# Patient Record
Sex: Female | Born: 1935 | Race: White | Hispanic: No | Smoking: Former smoker
Health system: Southern US, Community
[De-identification: ages and names within clinical notes are randomized; demographics above are authoritative.]

## PROBLEM LIST (undated history)

## (undated) DIAGNOSIS — D631 Anemia in chronic kidney disease: Secondary | ICD-10-CM

## (undated) DIAGNOSIS — D509 Iron deficiency anemia, unspecified: Secondary | ICD-10-CM

## (undated) DIAGNOSIS — M1990 Unspecified osteoarthritis, unspecified site: Secondary | ICD-10-CM

## (undated) DIAGNOSIS — C3490 Malignant neoplasm of unspecified part of unspecified bronchus or lung: Secondary | ICD-10-CM

## (undated) DIAGNOSIS — F329 Major depressive disorder, single episode, unspecified: Secondary | ICD-10-CM

## (undated) DIAGNOSIS — K5792 Diverticulitis of intestine, part unspecified, without perforation or abscess without bleeding: Secondary | ICD-10-CM

## (undated) DIAGNOSIS — Z22322 Carrier or suspected carrier of Methicillin resistant Staphylococcus aureus: Secondary | ICD-10-CM

## (undated) DIAGNOSIS — N183 Chronic kidney disease, stage 3 (moderate): Principal | ICD-10-CM

## (undated) DIAGNOSIS — I2510 Atherosclerotic heart disease of native coronary artery without angina pectoris: Secondary | ICD-10-CM

## (undated) DIAGNOSIS — Z950 Presence of cardiac pacemaker: Secondary | ICD-10-CM

## (undated) DIAGNOSIS — I495 Sick sinus syndrome: Secondary | ICD-10-CM

## (undated) DIAGNOSIS — I4891 Unspecified atrial fibrillation: Secondary | ICD-10-CM

## (undated) DIAGNOSIS — E119 Type 2 diabetes mellitus without complications: Secondary | ICD-10-CM

## (undated) DIAGNOSIS — F32A Depression, unspecified: Secondary | ICD-10-CM

## (undated) HISTORY — DX: Presence of cardiac pacemaker: Z95.0

## (undated) HISTORY — PX: PACEMAKER INSERTION: SHX728

## (undated) HISTORY — DX: Major depressive disorder, single episode, unspecified: F32.9

## (undated) HISTORY — PX: LUNG CANCER SURGERY: SHX702

## (undated) HISTORY — DX: Unspecified osteoarthritis, unspecified site: M19.90

## (undated) HISTORY — PX: CHOLECYSTECTOMY: SHX55

## (undated) HISTORY — DX: Malignant neoplasm of unspecified part of unspecified bronchus or lung: C34.90

## (undated) HISTORY — DX: Unspecified atrial fibrillation: I48.91

## (undated) HISTORY — PX: OTHER SURGICAL HISTORY: SHX169

## (undated) HISTORY — DX: Anemia in chronic kidney disease: D63.1

## (undated) HISTORY — DX: Type 2 diabetes mellitus without complications: E11.9

## (undated) HISTORY — DX: Iron deficiency anemia, unspecified: D50.9

## (undated) HISTORY — DX: Depression, unspecified: F32.A

## (undated) HISTORY — DX: Chronic kidney disease, stage 3 (moderate): N18.3

## (undated) HISTORY — DX: Sick sinus syndrome: I49.5

## (undated) HISTORY — DX: Diverticulitis of intestine, part unspecified, without perforation or abscess without bleeding: K57.92

## (undated) HISTORY — DX: Atherosclerotic heart disease of native coronary artery without angina pectoris: I25.10

## (undated) HISTORY — PX: BREAST BIOPSY: SHX20

---

## 1998-09-09 ENCOUNTER — Other Ambulatory Visit: Admission: RE | Admit: 1998-09-09 | Discharge: 1998-09-09 | Payer: Self-pay | Admitting: Family Medicine

## 1999-09-23 ENCOUNTER — Other Ambulatory Visit: Admission: RE | Admit: 1999-09-23 | Discharge: 1999-09-23 | Payer: Self-pay | Admitting: Family Medicine

## 2000-11-17 ENCOUNTER — Other Ambulatory Visit: Admission: RE | Admit: 2000-11-17 | Discharge: 2000-11-17 | Payer: Self-pay | Admitting: Family Medicine

## 2001-01-23 ENCOUNTER — Emergency Department (HOSPITAL_COMMUNITY): Admission: EM | Admit: 2001-01-23 | Discharge: 2001-01-23 | Payer: Self-pay | Admitting: Emergency Medicine

## 2001-03-22 ENCOUNTER — Encounter: Admission: RE | Admit: 2001-03-22 | Discharge: 2001-03-22 | Payer: Self-pay | Admitting: *Deleted

## 2001-07-19 ENCOUNTER — Encounter: Payer: Self-pay | Admitting: Family Medicine

## 2001-07-19 ENCOUNTER — Encounter: Admission: RE | Admit: 2001-07-19 | Discharge: 2001-07-19 | Payer: Self-pay | Admitting: Family Medicine

## 2003-01-16 ENCOUNTER — Encounter: Payer: Self-pay | Admitting: Family Medicine

## 2003-01-16 ENCOUNTER — Encounter: Admission: RE | Admit: 2003-01-16 | Discharge: 2003-01-16 | Payer: Self-pay | Admitting: Family Medicine

## 2003-01-31 ENCOUNTER — Inpatient Hospital Stay (HOSPITAL_COMMUNITY): Admission: AD | Admit: 2003-01-31 | Discharge: 2003-02-01 | Payer: Self-pay | Admitting: *Deleted

## 2003-01-31 ENCOUNTER — Encounter: Payer: Self-pay | Admitting: *Deleted

## 2003-02-05 ENCOUNTER — Ambulatory Visit (HOSPITAL_COMMUNITY): Admission: RE | Admit: 2003-02-05 | Discharge: 2003-02-05 | Payer: Self-pay | Admitting: *Deleted

## 2003-02-05 ENCOUNTER — Encounter: Payer: Self-pay | Admitting: *Deleted

## 2003-02-19 ENCOUNTER — Encounter (INDEPENDENT_AMBULATORY_CARE_PROVIDER_SITE_OTHER): Payer: Self-pay | Admitting: *Deleted

## 2003-02-19 ENCOUNTER — Ambulatory Visit (HOSPITAL_COMMUNITY): Admission: RE | Admit: 2003-02-19 | Discharge: 2003-02-19 | Payer: Self-pay | Admitting: Pulmonary Disease

## 2003-02-22 ENCOUNTER — Ambulatory Visit (HOSPITAL_COMMUNITY): Admission: RE | Admit: 2003-02-22 | Discharge: 2003-02-22 | Payer: Self-pay | Admitting: Pediatrics

## 2003-02-22 ENCOUNTER — Encounter: Payer: Self-pay | Admitting: Pediatrics

## 2003-02-27 ENCOUNTER — Encounter: Payer: Self-pay | Admitting: Pediatrics

## 2003-02-27 ENCOUNTER — Ambulatory Visit (HOSPITAL_COMMUNITY): Admission: RE | Admit: 2003-02-27 | Discharge: 2003-02-27 | Payer: Self-pay | Admitting: Pediatrics

## 2003-03-20 ENCOUNTER — Ambulatory Visit: Admission: RE | Admit: 2003-03-20 | Discharge: 2003-05-24 | Payer: Self-pay | Admitting: Radiation Oncology

## 2003-04-02 ENCOUNTER — Ambulatory Visit (HOSPITAL_COMMUNITY): Admission: RE | Admit: 2003-04-02 | Discharge: 2003-04-02 | Payer: Self-pay | Admitting: Hematology & Oncology

## 2003-05-18 ENCOUNTER — Ambulatory Visit (HOSPITAL_COMMUNITY): Admission: RE | Admit: 2003-05-18 | Discharge: 2003-05-18 | Payer: Self-pay | Admitting: Radiation Oncology

## 2003-06-19 ENCOUNTER — Ambulatory Visit (HOSPITAL_COMMUNITY): Admission: RE | Admit: 2003-06-19 | Discharge: 2003-06-19 | Payer: Self-pay | Admitting: Hematology & Oncology

## 2003-06-19 ENCOUNTER — Ambulatory Visit: Admission: RE | Admit: 2003-06-19 | Discharge: 2003-06-19 | Payer: Self-pay | Admitting: Radiation Oncology

## 2003-07-13 ENCOUNTER — Ambulatory Visit: Admission: RE | Admit: 2003-07-13 | Discharge: 2003-08-22 | Payer: Self-pay | Admitting: Radiation Oncology

## 2003-08-08 ENCOUNTER — Ambulatory Visit (HOSPITAL_COMMUNITY): Admission: RE | Admit: 2003-08-08 | Discharge: 2003-08-08 | Payer: Self-pay | Admitting: Radiation Oncology

## 2003-08-14 ENCOUNTER — Ambulatory Visit (HOSPITAL_COMMUNITY): Admission: RE | Admit: 2003-08-14 | Discharge: 2003-08-14 | Payer: Self-pay | Admitting: Radiation Oncology

## 2003-08-16 ENCOUNTER — Encounter: Admission: RE | Admit: 2003-08-16 | Discharge: 2003-08-16 | Payer: Self-pay | Admitting: Radiation Oncology

## 2003-09-18 ENCOUNTER — Ambulatory Visit: Admission: RE | Admit: 2003-09-18 | Discharge: 2003-09-18 | Payer: Self-pay | Admitting: Radiation Oncology

## 2003-09-20 ENCOUNTER — Ambulatory Visit (HOSPITAL_COMMUNITY): Admission: RE | Admit: 2003-09-20 | Discharge: 2003-09-20 | Payer: Self-pay | Admitting: Hematology & Oncology

## 2003-11-20 ENCOUNTER — Ambulatory Visit: Admission: RE | Admit: 2003-11-20 | Discharge: 2003-11-20 | Payer: Self-pay | Admitting: Radiation Oncology

## 2003-12-27 ENCOUNTER — Ambulatory Visit (HOSPITAL_COMMUNITY): Admission: RE | Admit: 2003-12-27 | Discharge: 2003-12-27 | Payer: Self-pay | Admitting: Hematology & Oncology

## 2004-02-19 ENCOUNTER — Ambulatory Visit: Admission: RE | Admit: 2004-02-19 | Discharge: 2004-02-19 | Payer: Self-pay | Admitting: Radiation Oncology

## 2004-02-26 ENCOUNTER — Encounter (HOSPITAL_COMMUNITY): Admission: RE | Admit: 2004-02-26 | Discharge: 2004-05-08 | Payer: Self-pay | Admitting: Radiation Oncology

## 2004-03-17 ENCOUNTER — Ambulatory Visit: Payer: Self-pay | Admitting: Cardiology

## 2004-03-20 ENCOUNTER — Ambulatory Visit: Payer: Self-pay | Admitting: Hematology & Oncology

## 2004-03-26 ENCOUNTER — Ambulatory Visit: Payer: Self-pay | Admitting: Family Medicine

## 2004-04-01 ENCOUNTER — Ambulatory Visit: Payer: Self-pay

## 2004-04-04 ENCOUNTER — Ambulatory Visit (HOSPITAL_COMMUNITY): Admission: RE | Admit: 2004-04-04 | Discharge: 2004-04-04 | Payer: Self-pay | Admitting: Hematology & Oncology

## 2004-04-29 ENCOUNTER — Encounter: Admission: RE | Admit: 2004-04-29 | Discharge: 2004-04-29 | Payer: Self-pay | Admitting: Family Medicine

## 2004-05-07 ENCOUNTER — Ambulatory Visit: Payer: Self-pay | Admitting: Family Medicine

## 2004-05-20 ENCOUNTER — Ambulatory Visit: Payer: Self-pay | Admitting: *Deleted

## 2004-05-23 ENCOUNTER — Ambulatory Visit: Payer: Self-pay | Admitting: *Deleted

## 2004-05-29 ENCOUNTER — Ambulatory Visit: Payer: Self-pay

## 2004-06-18 ENCOUNTER — Ambulatory Visit: Payer: Self-pay | Admitting: Hematology & Oncology

## 2004-06-25 ENCOUNTER — Ambulatory Visit: Admission: RE | Admit: 2004-06-25 | Discharge: 2004-06-25 | Payer: Self-pay | Admitting: Radiation Oncology

## 2004-07-30 ENCOUNTER — Ambulatory Visit (HOSPITAL_COMMUNITY): Admission: RE | Admit: 2004-07-30 | Discharge: 2004-07-30 | Payer: Self-pay | Admitting: Hematology & Oncology

## 2004-08-19 ENCOUNTER — Ambulatory Visit: Payer: Self-pay | Admitting: Cardiology

## 2004-08-28 ENCOUNTER — Ambulatory Visit: Payer: Self-pay | Admitting: Cardiology

## 2004-09-09 ENCOUNTER — Ambulatory Visit: Payer: Self-pay | Admitting: Hematology & Oncology

## 2004-11-17 ENCOUNTER — Ambulatory Visit: Payer: Self-pay | Admitting: *Deleted

## 2004-11-18 ENCOUNTER — Ambulatory Visit: Payer: Self-pay | Admitting: *Deleted

## 2004-11-20 ENCOUNTER — Ambulatory Visit: Payer: Self-pay | Admitting: Internal Medicine

## 2004-11-21 ENCOUNTER — Ambulatory Visit: Payer: Self-pay

## 2004-11-25 ENCOUNTER — Ambulatory Visit: Payer: Self-pay | Admitting: Family Medicine

## 2004-12-01 ENCOUNTER — Encounter: Admission: RE | Admit: 2004-12-01 | Discharge: 2004-12-01 | Payer: Self-pay | Admitting: Family Medicine

## 2004-12-05 ENCOUNTER — Inpatient Hospital Stay (HOSPITAL_COMMUNITY): Admission: EM | Admit: 2004-12-05 | Discharge: 2004-12-06 | Payer: Self-pay | Admitting: Emergency Medicine

## 2004-12-05 ENCOUNTER — Ambulatory Visit: Payer: Self-pay | Admitting: Endocrinology

## 2005-02-09 ENCOUNTER — Ambulatory Visit: Payer: Self-pay | Admitting: Cardiology

## 2005-02-12 ENCOUNTER — Ambulatory Visit: Payer: Self-pay | Admitting: Cardiology

## 2005-03-03 ENCOUNTER — Ambulatory Visit: Payer: Self-pay | Admitting: Hematology & Oncology

## 2005-03-06 ENCOUNTER — Ambulatory Visit (HOSPITAL_COMMUNITY): Admission: RE | Admit: 2005-03-06 | Discharge: 2005-03-06 | Payer: Self-pay | Admitting: Hematology & Oncology

## 2005-03-26 ENCOUNTER — Ambulatory Visit: Payer: Self-pay | Admitting: Family Medicine

## 2005-03-26 ENCOUNTER — Ambulatory Visit: Payer: Self-pay | Admitting: Internal Medicine

## 2005-04-30 ENCOUNTER — Ambulatory Visit: Payer: Self-pay | Admitting: Cardiology

## 2005-05-07 ENCOUNTER — Ambulatory Visit: Payer: Self-pay | Admitting: *Deleted

## 2005-05-12 ENCOUNTER — Ambulatory Visit: Payer: Self-pay | Admitting: *Deleted

## 2005-05-26 ENCOUNTER — Ambulatory Visit: Payer: Self-pay | Admitting: Family Medicine

## 2005-06-09 ENCOUNTER — Ambulatory Visit: Payer: Self-pay | Admitting: Family Medicine

## 2005-06-23 ENCOUNTER — Ambulatory Visit: Payer: Self-pay | Admitting: Family Medicine

## 2005-06-26 ENCOUNTER — Ambulatory Visit: Payer: Self-pay | Admitting: *Deleted

## 2005-07-02 ENCOUNTER — Ambulatory Visit: Payer: Self-pay | Admitting: Cardiology

## 2005-08-06 ENCOUNTER — Encounter: Admission: RE | Admit: 2005-08-06 | Discharge: 2005-08-06 | Payer: Self-pay | Admitting: Family Medicine

## 2005-09-04 ENCOUNTER — Ambulatory Visit: Payer: Self-pay | Admitting: Hematology & Oncology

## 2005-09-07 LAB — CBC WITH DIFFERENTIAL/PLATELET
BASO%: 0.9 % (ref 0.0–2.0)
EOS%: 3.8 % (ref 0.0–7.0)
HCT: 34 % — ABNORMAL LOW (ref 34.8–46.6)
LYMPH%: 15.4 % (ref 14.0–48.0)
MCH: 30.2 pg (ref 26.0–34.0)
MCHC: 34.3 g/dL (ref 32.0–36.0)
MCV: 88.1 fL (ref 81.0–101.0)
NEUT%: 73.4 % (ref 39.6–76.8)
Platelets: 286 10*3/uL (ref 145–400)

## 2005-09-07 LAB — COMPREHENSIVE METABOLIC PANEL
ALT: 12 U/L (ref 0–40)
AST: 19 U/L (ref 0–37)
Creatinine, Ser: 1.2 mg/dL (ref 0.4–1.2)
Total Bilirubin: 0.4 mg/dL (ref 0.3–1.2)

## 2005-09-07 LAB — LACTATE DEHYDROGENASE: LDH: 135 U/L (ref 94–250)

## 2005-09-09 ENCOUNTER — Ambulatory Visit (HOSPITAL_COMMUNITY): Admission: RE | Admit: 2005-09-09 | Discharge: 2005-09-09 | Payer: Self-pay | Admitting: Hematology & Oncology

## 2006-01-09 ENCOUNTER — Inpatient Hospital Stay (HOSPITAL_COMMUNITY): Admission: EM | Admit: 2006-01-09 | Discharge: 2006-01-13 | Payer: Self-pay | Admitting: Emergency Medicine

## 2006-01-10 ENCOUNTER — Ambulatory Visit: Payer: Self-pay | Admitting: Internal Medicine

## 2006-01-14 ENCOUNTER — Ambulatory Visit: Payer: Self-pay | Admitting: *Deleted

## 2006-01-20 ENCOUNTER — Ambulatory Visit: Payer: Self-pay | Admitting: Family Medicine

## 2006-01-26 ENCOUNTER — Ambulatory Visit: Payer: Self-pay | Admitting: Hematology & Oncology

## 2006-01-27 LAB — COMPREHENSIVE METABOLIC PANEL
BUN: 31 mg/dL — ABNORMAL HIGH (ref 6–23)
CO2: 21 mEq/L (ref 19–32)
Calcium: 8.9 mg/dL (ref 8.4–10.5)
Chloride: 108 mEq/L (ref 96–112)
Creatinine, Ser: 1.39 mg/dL — ABNORMAL HIGH (ref 0.40–1.20)

## 2006-01-27 LAB — CBC WITH DIFFERENTIAL/PLATELET
Basophils Absolute: 0 10*3/uL (ref 0.0–0.1)
HCT: 30.3 % — ABNORMAL LOW (ref 34.8–46.6)
HGB: 10.4 g/dL — ABNORMAL LOW (ref 11.6–15.9)
MCH: 28.7 pg (ref 26.0–34.0)
MONO#: 0.4 10*3/uL (ref 0.1–0.9)
NEUT%: 78 % — ABNORMAL HIGH (ref 39.6–76.8)
WBC: 5.4 10*3/uL (ref 3.9–10.0)
lymph#: 0.6 10*3/uL — ABNORMAL LOW (ref 0.9–3.3)

## 2006-01-27 LAB — LACTATE DEHYDROGENASE: LDH: 114 U/L (ref 94–250)

## 2006-01-28 ENCOUNTER — Encounter (INDEPENDENT_AMBULATORY_CARE_PROVIDER_SITE_OTHER): Payer: Self-pay | Admitting: *Deleted

## 2006-01-28 ENCOUNTER — Ambulatory Visit (HOSPITAL_COMMUNITY): Admission: RE | Admit: 2006-01-28 | Discharge: 2006-01-28 | Payer: Self-pay | Admitting: Hematology & Oncology

## 2006-01-29 ENCOUNTER — Ambulatory Visit (HOSPITAL_COMMUNITY): Admission: RE | Admit: 2006-01-29 | Discharge: 2006-01-29 | Payer: Self-pay | Admitting: Family Medicine

## 2006-02-02 ENCOUNTER — Ambulatory Visit (HOSPITAL_COMMUNITY): Admission: RE | Admit: 2006-02-02 | Discharge: 2006-02-02 | Payer: Self-pay | Admitting: Hematology & Oncology

## 2006-02-11 ENCOUNTER — Inpatient Hospital Stay (HOSPITAL_COMMUNITY): Admission: EM | Admit: 2006-02-11 | Discharge: 2006-02-19 | Payer: Self-pay | Admitting: Emergency Medicine

## 2006-02-11 ENCOUNTER — Ambulatory Visit: Payer: Self-pay | Admitting: Cardiology

## 2006-02-12 ENCOUNTER — Encounter (INDEPENDENT_AMBULATORY_CARE_PROVIDER_SITE_OTHER): Payer: Self-pay | Admitting: Specialist

## 2006-02-12 ENCOUNTER — Encounter: Payer: Self-pay | Admitting: Cardiology

## 2006-02-15 ENCOUNTER — Ambulatory Visit: Payer: Self-pay | Admitting: Hematology & Oncology

## 2006-02-17 ENCOUNTER — Encounter: Payer: Self-pay | Admitting: Internal Medicine

## 2006-02-23 ENCOUNTER — Ambulatory Visit: Payer: Self-pay | Admitting: Internal Medicine

## 2006-02-23 ENCOUNTER — Ambulatory Visit: Payer: Self-pay | Admitting: Cardiology

## 2006-03-02 ENCOUNTER — Ambulatory Visit: Payer: Self-pay | Admitting: Family Medicine

## 2006-03-03 ENCOUNTER — Ambulatory Visit: Payer: Self-pay | Admitting: *Deleted

## 2006-03-04 ENCOUNTER — Ambulatory Visit: Payer: Self-pay | Admitting: Cardiology

## 2006-03-04 ENCOUNTER — Ambulatory Visit: Payer: Self-pay | Admitting: *Deleted

## 2006-03-05 ENCOUNTER — Inpatient Hospital Stay (HOSPITAL_COMMUNITY): Admission: EM | Admit: 2006-03-05 | Discharge: 2006-03-08 | Payer: Self-pay | Admitting: Emergency Medicine

## 2006-03-05 ENCOUNTER — Ambulatory Visit: Payer: Self-pay | Admitting: Cardiology

## 2006-03-08 ENCOUNTER — Encounter: Payer: Self-pay | Admitting: Cardiology

## 2006-03-11 ENCOUNTER — Ambulatory Visit: Payer: Self-pay | Admitting: *Deleted

## 2006-03-15 ENCOUNTER — Ambulatory Visit: Payer: Self-pay | Admitting: *Deleted

## 2006-03-15 ENCOUNTER — Ambulatory Visit: Payer: Self-pay | Admitting: Cardiology

## 2006-03-16 ENCOUNTER — Ambulatory Visit: Payer: Self-pay | Admitting: *Deleted

## 2006-03-16 ENCOUNTER — Ambulatory Visit: Payer: Self-pay | Admitting: Hematology & Oncology

## 2006-03-22 ENCOUNTER — Inpatient Hospital Stay (HOSPITAL_COMMUNITY): Admission: AD | Admit: 2006-03-22 | Discharge: 2006-03-25 | Payer: Self-pay | Admitting: Cardiovascular Disease

## 2006-03-22 ENCOUNTER — Ambulatory Visit: Payer: Self-pay | Admitting: Cardiovascular Disease

## 2006-04-06 ENCOUNTER — Ambulatory Visit: Payer: Self-pay | Admitting: Internal Medicine

## 2006-04-06 ENCOUNTER — Ambulatory Visit: Payer: Self-pay | Admitting: Cardiology

## 2006-04-13 ENCOUNTER — Ambulatory Visit: Payer: Self-pay | Admitting: Cardiology

## 2006-04-15 ENCOUNTER — Ambulatory Visit (HOSPITAL_COMMUNITY): Admission: RE | Admit: 2006-04-15 | Discharge: 2006-04-15 | Payer: Self-pay | Admitting: Hematology & Oncology

## 2006-04-22 ENCOUNTER — Ambulatory Visit: Payer: Self-pay | Admitting: Internal Medicine

## 2006-04-27 ENCOUNTER — Ambulatory Visit: Payer: Self-pay | Admitting: Cardiology

## 2006-05-14 ENCOUNTER — Ambulatory Visit: Payer: Self-pay | Admitting: Cardiology

## 2006-05-28 ENCOUNTER — Ambulatory Visit: Payer: Self-pay | Admitting: Cardiovascular Disease

## 2006-05-28 ENCOUNTER — Ambulatory Visit: Payer: Self-pay | Admitting: *Deleted

## 2006-06-22 ENCOUNTER — Ambulatory Visit: Payer: Self-pay | Admitting: *Deleted

## 2006-06-22 ENCOUNTER — Ambulatory Visit: Payer: Self-pay | Admitting: Cardiology

## 2006-06-22 ENCOUNTER — Ambulatory Visit: Payer: Self-pay | Admitting: Internal Medicine

## 2006-06-30 ENCOUNTER — Ambulatory Visit: Payer: Self-pay | Admitting: Family Medicine

## 2006-07-06 ENCOUNTER — Ambulatory Visit: Payer: Self-pay | Admitting: Internal Medicine

## 2006-07-06 ENCOUNTER — Ambulatory Visit: Payer: Self-pay | Admitting: Critical Care Medicine

## 2006-07-06 ENCOUNTER — Inpatient Hospital Stay (HOSPITAL_COMMUNITY): Admission: EM | Admit: 2006-07-06 | Discharge: 2006-08-05 | Payer: Self-pay | Admitting: Emergency Medicine

## 2006-07-10 ENCOUNTER — Encounter (INDEPENDENT_AMBULATORY_CARE_PROVIDER_SITE_OTHER): Payer: Self-pay | Admitting: *Deleted

## 2006-07-18 ENCOUNTER — Encounter (INDEPENDENT_AMBULATORY_CARE_PROVIDER_SITE_OTHER): Payer: Self-pay | Admitting: *Deleted

## 2006-07-19 ENCOUNTER — Encounter: Payer: Self-pay | Admitting: Internal Medicine

## 2006-08-26 ENCOUNTER — Encounter: Admission: RE | Admit: 2006-08-26 | Discharge: 2006-08-26 | Payer: Self-pay | Admitting: Internal Medicine

## 2006-09-05 ENCOUNTER — Inpatient Hospital Stay (HOSPITAL_COMMUNITY): Admission: EM | Admit: 2006-09-05 | Discharge: 2006-09-09 | Payer: Self-pay | Admitting: Emergency Medicine

## 2006-12-21 ENCOUNTER — Ambulatory Visit: Payer: Self-pay | Admitting: Hematology & Oncology

## 2006-12-22 DIAGNOSIS — F329 Major depressive disorder, single episode, unspecified: Secondary | ICD-10-CM

## 2006-12-22 DIAGNOSIS — Z8719 Personal history of other diseases of the digestive system: Secondary | ICD-10-CM | POA: Insufficient documentation

## 2006-12-22 DIAGNOSIS — E119 Type 2 diabetes mellitus without complications: Secondary | ICD-10-CM

## 2006-12-28 ENCOUNTER — Ambulatory Visit: Payer: Self-pay | Admitting: Family Medicine

## 2006-12-28 DIAGNOSIS — M479 Spondylosis, unspecified: Secondary | ICD-10-CM | POA: Insufficient documentation

## 2006-12-28 DIAGNOSIS — I2510 Atherosclerotic heart disease of native coronary artery without angina pectoris: Secondary | ICD-10-CM

## 2006-12-28 LAB — CONVERTED CEMR LAB
Bilirubin Urine: NEGATIVE
Blood in Urine, dipstick: NEGATIVE
Glucose, Urine, Semiquant: NEGATIVE
Ketones, urine, test strip: NEGATIVE
pH: 6

## 2007-02-23 ENCOUNTER — Ambulatory Visit: Payer: Self-pay | Admitting: Internal Medicine

## 2007-02-23 LAB — CONVERTED CEMR LAB
BUN: 22 mg/dL (ref 6–23)
Basophils Relative: 0.8 % (ref 0.0–1.0)
CO2: 27 meq/L (ref 19–32)
Calcium: 10.4 mg/dL (ref 8.4–10.5)
Eosinophils Absolute: 0.2 10*3/uL (ref 0.0–0.6)
GFR calc Af Amer: 63 mL/min
GFR calc non Af Amer: 52 mL/min
Hemoglobin: 10.2 g/dL — ABNORMAL LOW (ref 12.0–15.0)
Lymphocytes Relative: 10.1 % — ABNORMAL LOW (ref 12.0–46.0)
MCHC: 34.2 g/dL (ref 30.0–36.0)
MCV: 91.2 fL (ref 78.0–100.0)
Monocytes Absolute: 0.9 10*3/uL — ABNORMAL HIGH (ref 0.2–0.7)
Monocytes Relative: 7.6 % (ref 3.0–11.0)
Neutro Abs: 9.2 10*3/uL — ABNORMAL HIGH (ref 1.4–7.7)
Platelets: 335 10*3/uL (ref 150–400)
Potassium: 4.5 meq/L (ref 3.5–5.1)

## 2007-04-04 ENCOUNTER — Ambulatory Visit: Payer: Self-pay | Admitting: Hematology & Oncology

## 2007-04-08 LAB — CBC WITH DIFFERENTIAL/PLATELET
Basophils Absolute: 0 10*3/uL (ref 0.0–0.1)
EOS%: 3.2 % (ref 0.0–7.0)
Eosinophils Absolute: 0.2 10*3/uL (ref 0.0–0.5)
HCT: 31.8 % — ABNORMAL LOW (ref 34.8–46.6)
HGB: 11 g/dL — ABNORMAL LOW (ref 11.6–15.9)
MCH: 30.6 pg (ref 26.0–34.0)
NEUT#: 4.3 10*3/uL (ref 1.5–6.5)
NEUT%: 69.7 % (ref 39.6–76.8)
lymph#: 1.1 10*3/uL (ref 0.9–3.3)

## 2007-04-08 LAB — COMPREHENSIVE METABOLIC PANEL
AST: 14 U/L (ref 0–37)
Albumin: 4.1 g/dL (ref 3.5–5.2)
BUN: 30 mg/dL — ABNORMAL HIGH (ref 6–23)
CO2: 22 mEq/L (ref 19–32)
Calcium: 9.3 mg/dL (ref 8.4–10.5)
Chloride: 104 mEq/L (ref 96–112)
Creatinine, Ser: 1.49 mg/dL — ABNORMAL HIGH (ref 0.40–1.20)
Glucose, Bld: 72 mg/dL (ref 70–99)

## 2007-04-11 ENCOUNTER — Ambulatory Visit (HOSPITAL_COMMUNITY): Admission: RE | Admit: 2007-04-11 | Discharge: 2007-04-11 | Payer: Self-pay | Admitting: Hematology & Oncology

## 2007-05-25 ENCOUNTER — Ambulatory Visit: Payer: Self-pay | Admitting: Internal Medicine

## 2007-06-02 ENCOUNTER — Ambulatory Visit: Payer: Self-pay | Admitting: Hematology & Oncology

## 2007-06-06 LAB — CBC & DIFF AND RETIC
Basophils Absolute: 0 10*3/uL (ref 0.0–0.1)
EOS%: 4.4 % (ref 0.0–7.0)
HCT: 36.6 % (ref 34.8–46.6)
HGB: 12.8 g/dL (ref 11.6–15.9)
MCH: 30.8 pg (ref 26.0–34.0)
MCV: 88.3 fL (ref 81.0–101.0)
MONO%: 7 % (ref 0.0–13.0)
NEUT%: 65.1 % (ref 39.6–76.8)

## 2007-08-24 ENCOUNTER — Ambulatory Visit: Payer: Self-pay | Admitting: Hematology & Oncology

## 2007-10-06 ENCOUNTER — Ambulatory Visit: Payer: Self-pay | Admitting: Hematology & Oncology

## 2007-10-10 LAB — CBC & DIFF AND RETIC
Basophils Absolute: 0 10*3/uL (ref 0.0–0.1)
Eosinophils Absolute: 0.2 10*3/uL (ref 0.0–0.5)
HGB: 11.2 g/dL — ABNORMAL LOW (ref 11.6–15.9)
IRF: 0.33 (ref 0.130–0.330)
MCV: 92.2 fL (ref 81.0–101.0)
MONO%: 7.1 % (ref 0.0–13.0)
NEUT#: 3.8 10*3/uL (ref 1.5–6.5)
RDW: 13.7 % (ref 11.3–14.5)
RETIC #: 98.8 10*3/uL (ref 19.7–115.1)

## 2007-10-10 LAB — CHCC SMEAR

## 2007-10-12 LAB — PROTEIN ELECTROPHORESIS, SERUM
Albumin ELP: 60.9 % (ref 55.8–66.1)
Alpha-1-Globulin: 3.5 % (ref 2.9–4.9)
Alpha-2-Globulin: 10.6 % (ref 7.1–11.8)
Beta 2: 4.5 % (ref 3.2–6.5)

## 2007-10-20 ENCOUNTER — Ambulatory Visit (HOSPITAL_COMMUNITY): Admission: RE | Admit: 2007-10-20 | Discharge: 2007-10-20 | Payer: Self-pay | Admitting: Hematology & Oncology

## 2007-10-25 IMAGING — CR DG CHEST 1V PORT
1 series · 1 of 1 positions shown · non-contrast
Comparison: 07/25/06.

CLINICAL DATA: 74-year-old female, pneumonia, hypoglycemia, hypoxia. 
PORTABLE CHEST ? 1 VIEW:

[view not recorded]
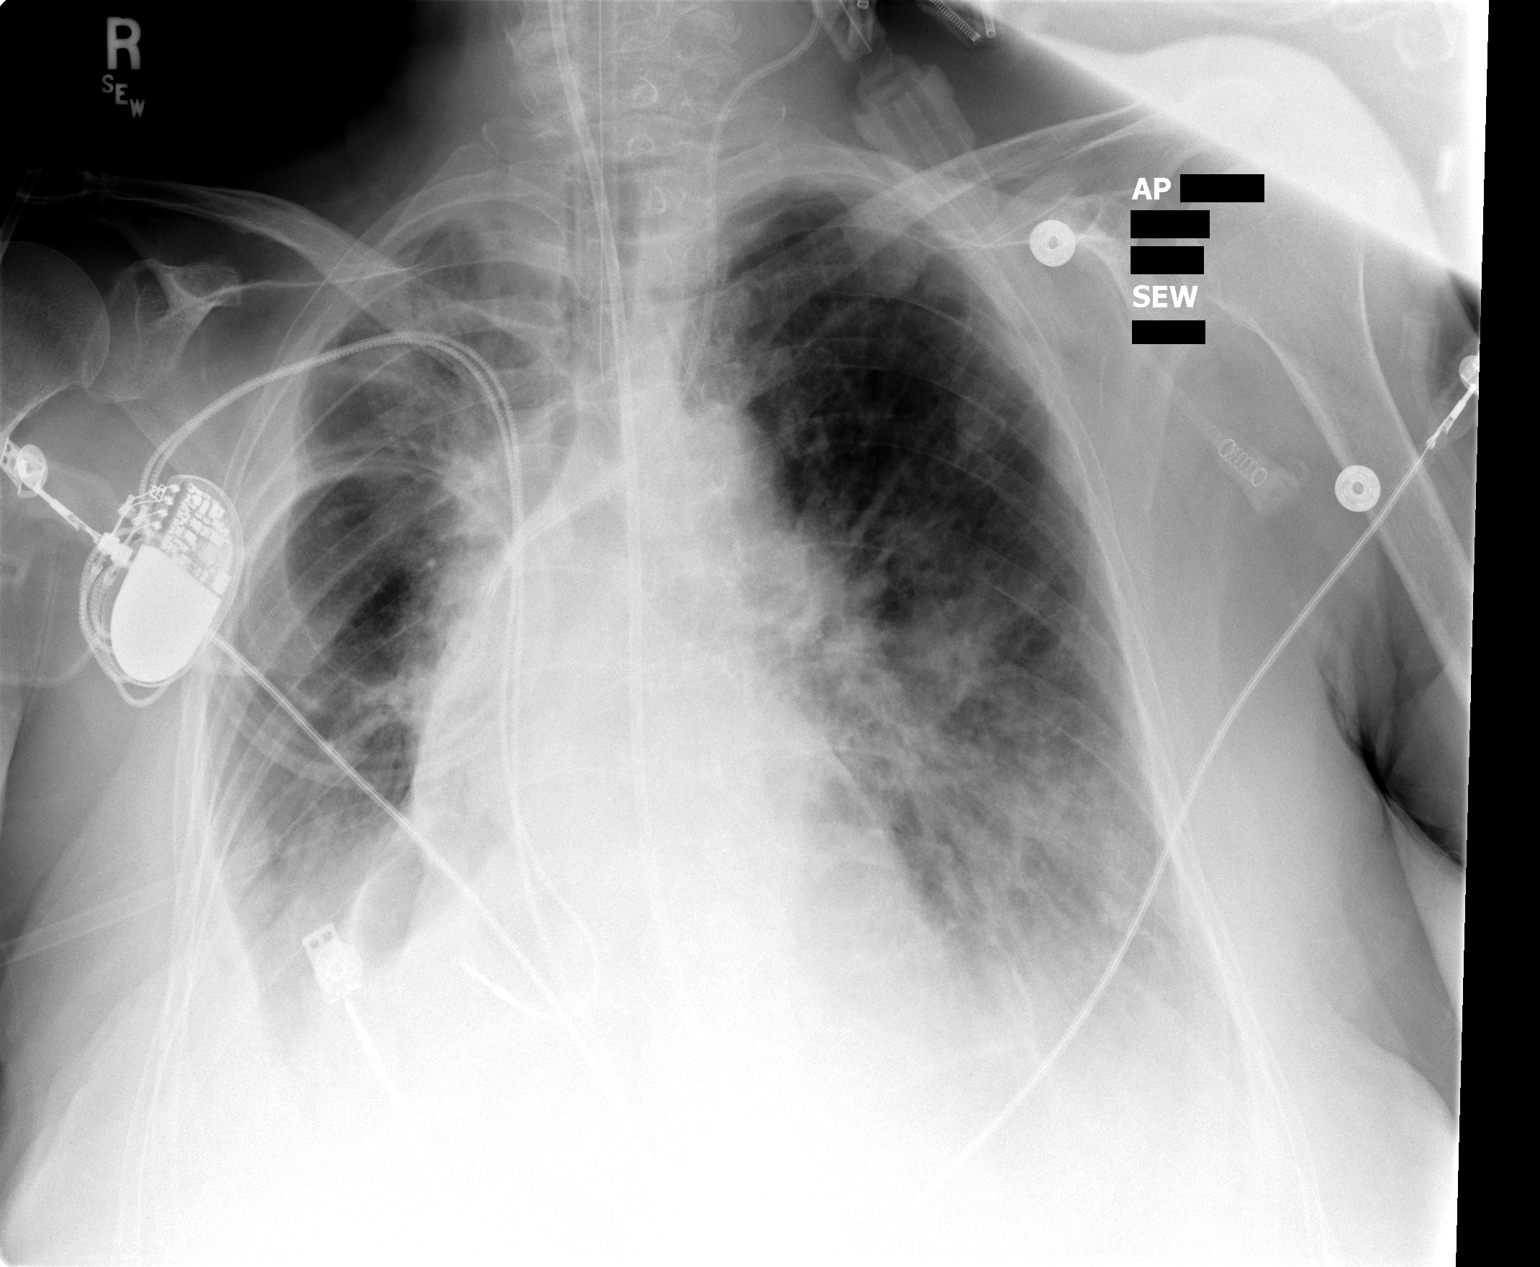

[1 of 1 positions shown; findings below may reference images not displayed]

FINDINGS: Endotracheal tube remains 5 cm above the carina.  Left IJ central line tip is in the SVC proximally.   Right subclavian pacemaker is again noted.  Feeding tube enters the stomach with the tip not visualized.  Stable nodular density in the left upper lobe.  Bilateral asymmetric airspace disease versus edema is noted more pronounced in the lower lobes.  Atelectasis is also evident in the lower lobes.  Bilateral effusions are suspected, larger on the right.  No pneumothorax.
IMPRESSION: 1.  Persistent bilateral airspace disease versus edema with basilar atelectasis. 
2.  Bilateral pleural effusions.

## 2007-10-29 IMAGING — CR DG CHEST 1V PORT
1 series · 1 of 1 positions shown · non-contrast
Comparison: 07/29/2006

CLINICAL DATA: Respiratory failure

PORTABLE CHEST - 1 VIEW:

[view not recorded]
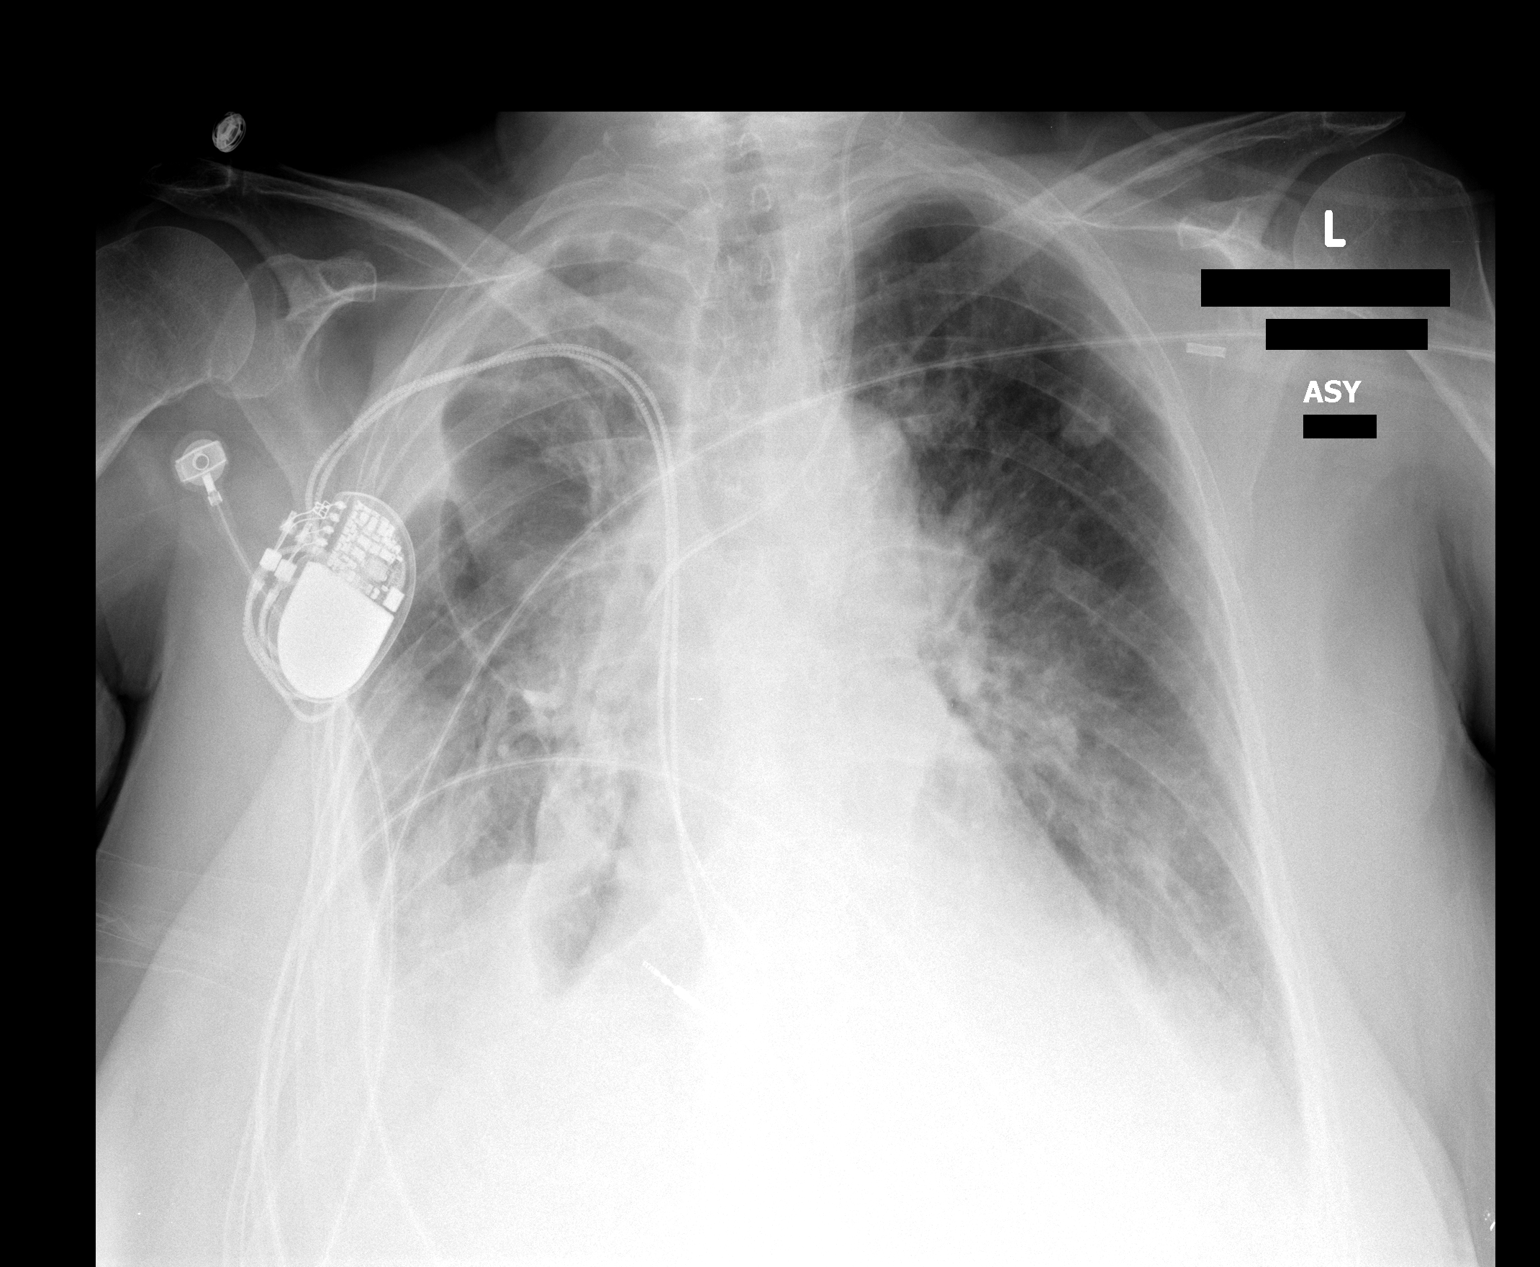

[1 of 1 positions shown; findings below may reference images not displayed]

FINDINGS: Interval removal of feeding tube. Otherwise support devices are
unchanged. Worsening aeration on the right. Continued left perihilar and lower
lobe opacity. Small bilateral effusions noted.
IMPRESSION: Interval worsening in aeration, particularly on the right.

## 2007-10-31 IMAGING — CR DG CHEST 1V PORT
1 series · 1 of 1 positions shown · non-contrast
Comparison: 07/30/06.

CLINICAL DATA: Pneumonia.  Hyperglycemia and hypoxia.  
 PORTABLE CHEST - 1 VIEW 08/01/06:

[view not recorded]
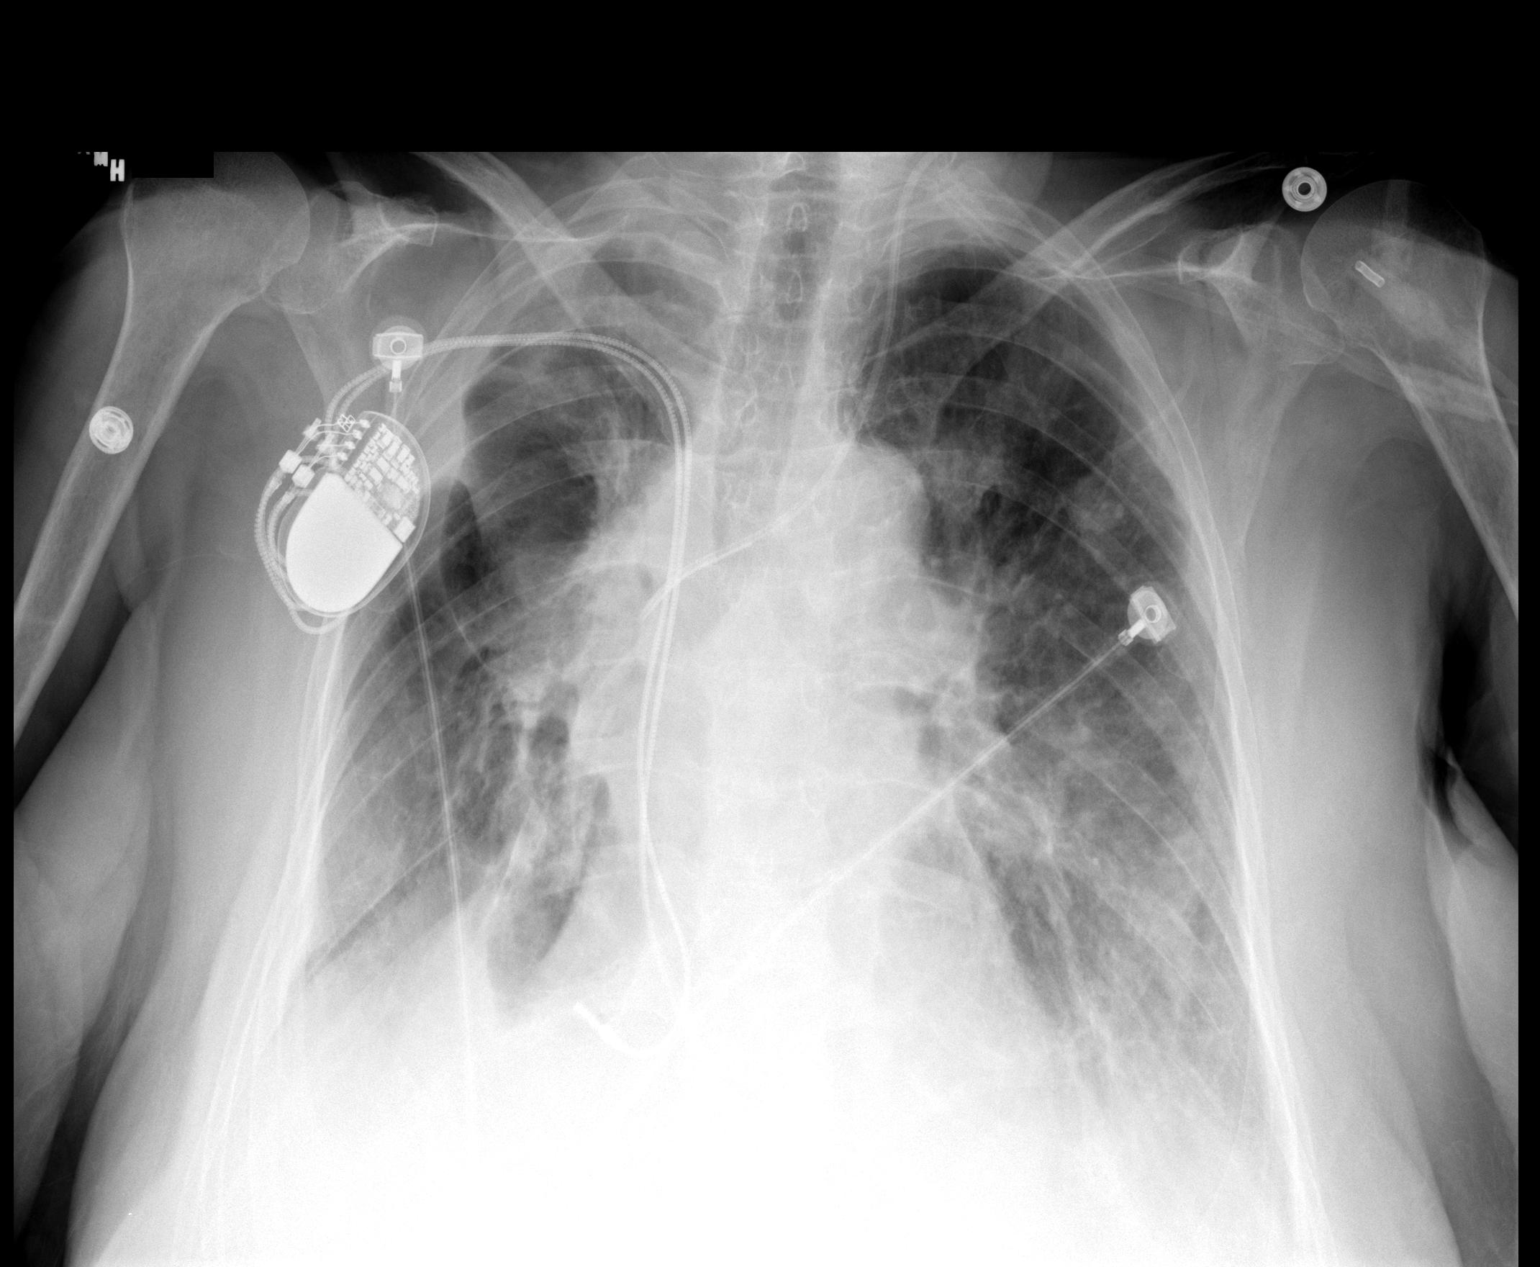

[1 of 1 positions shown; findings below may reference images not displayed]

FINDINGS: Right chest wall pacer device is noted with lead in the right atrial appendage and right ventricle.  Heart size is enlarged.  There are bilateral pleural effusions, interstitial edema, left perihilar and left lower lobe opacities.
IMPRESSION: 1.  Persistent bilateral effusions and edema pattern. 
 2.  No change in aeration of the lungs.

## 2007-11-01 IMAGING — CR DG CHEST 1V PORT
1 series · 1 of 1 positions shown · non-contrast
Comparison: Comparison is made to yesterday?s exam.

CLINICAL DATA: Pneumonia, hyperglycemia, hypoxia. 
 PORTABLE CHEST - 1 VIEW ? 08/02/06 AT 9699 HOURS:

[view not recorded]
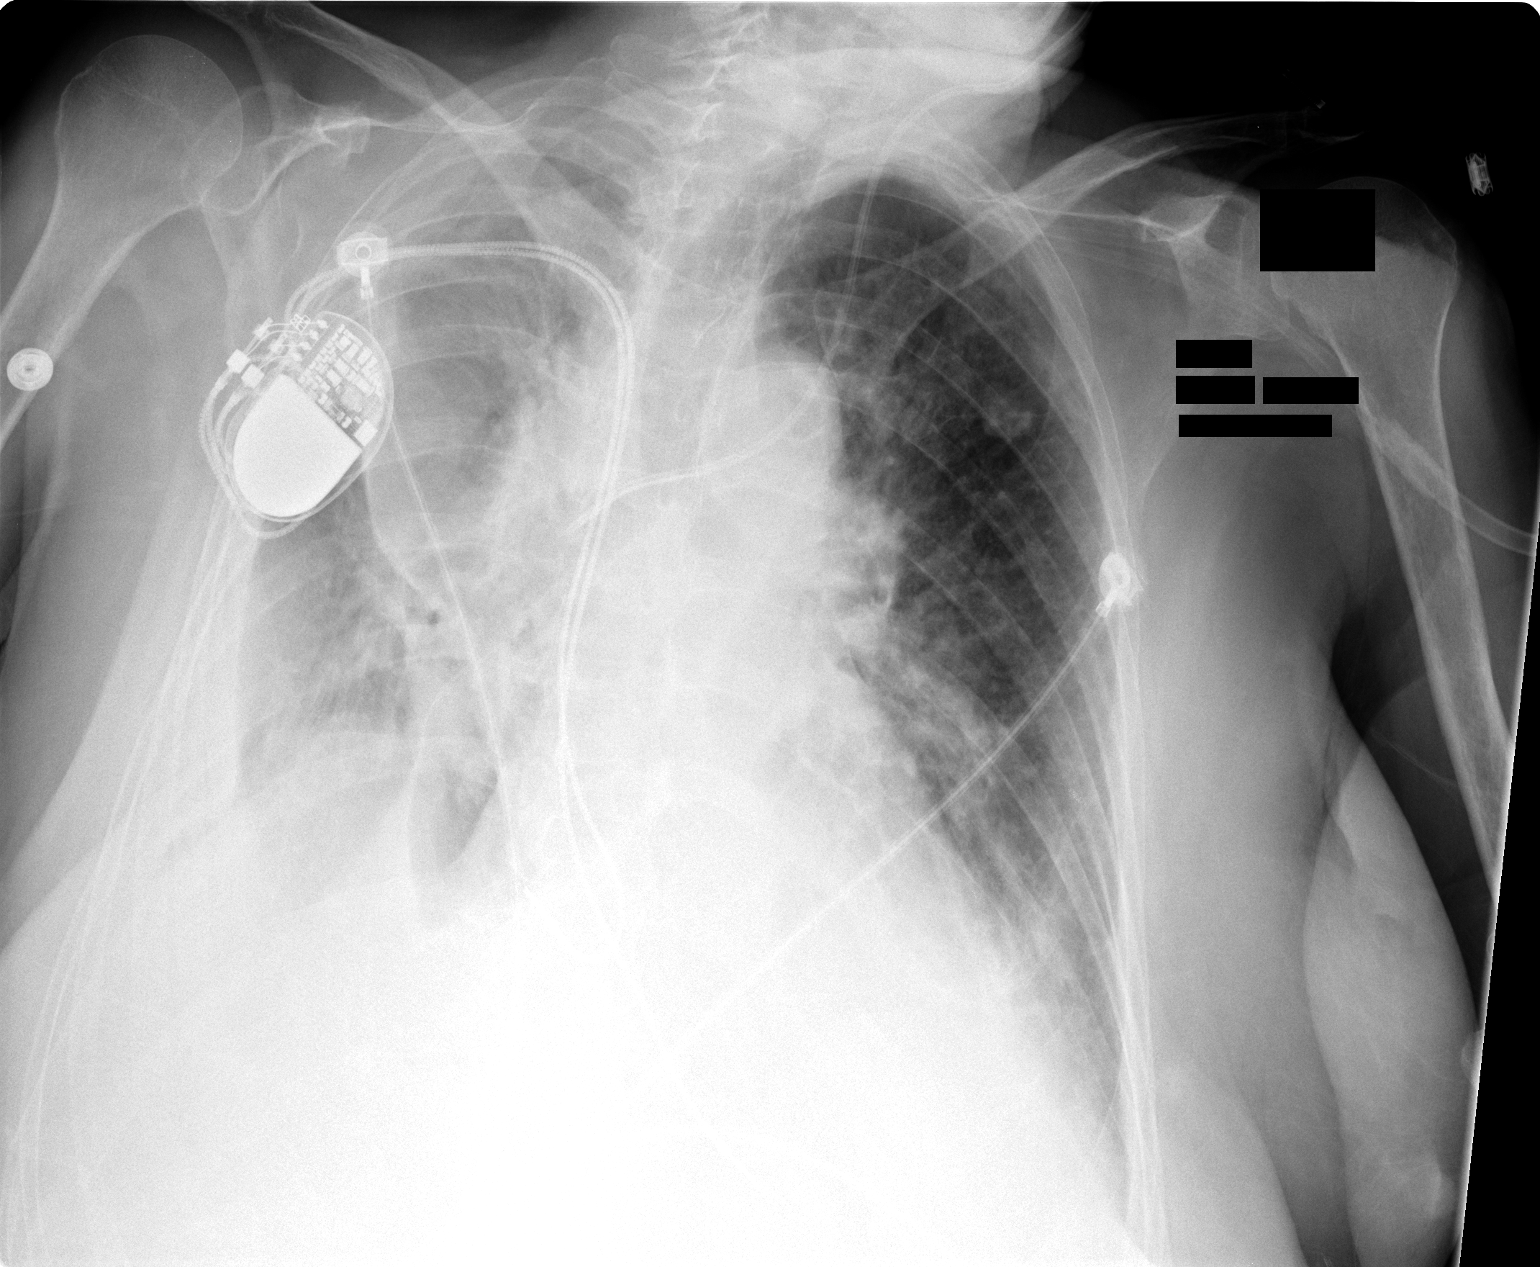

[1 of 1 positions shown; findings below may reference images not displayed]

FINDINGS: Pulmonary vascular congestion. Probable partial left lower lobe atelectasis. Increase in right pleural effusion. Left IJ CVC tip is in the  SVC.   Right atrial and right ventricular pacer leads are in position.
IMPRESSION: Increase in right pleural effusion.  Atelectatic changes at the bases.  Pulmonary vascular congestion is again noted.

## 2007-11-02 IMAGING — CR DG CHEST 1V
1 series · 1 of 1 positions shown · non-contrast
Comparison: 08/02/06.

CLINICAL DATA: Post-thoracentesis. 
 CHEST ? 1 VIEW:

[view not recorded]
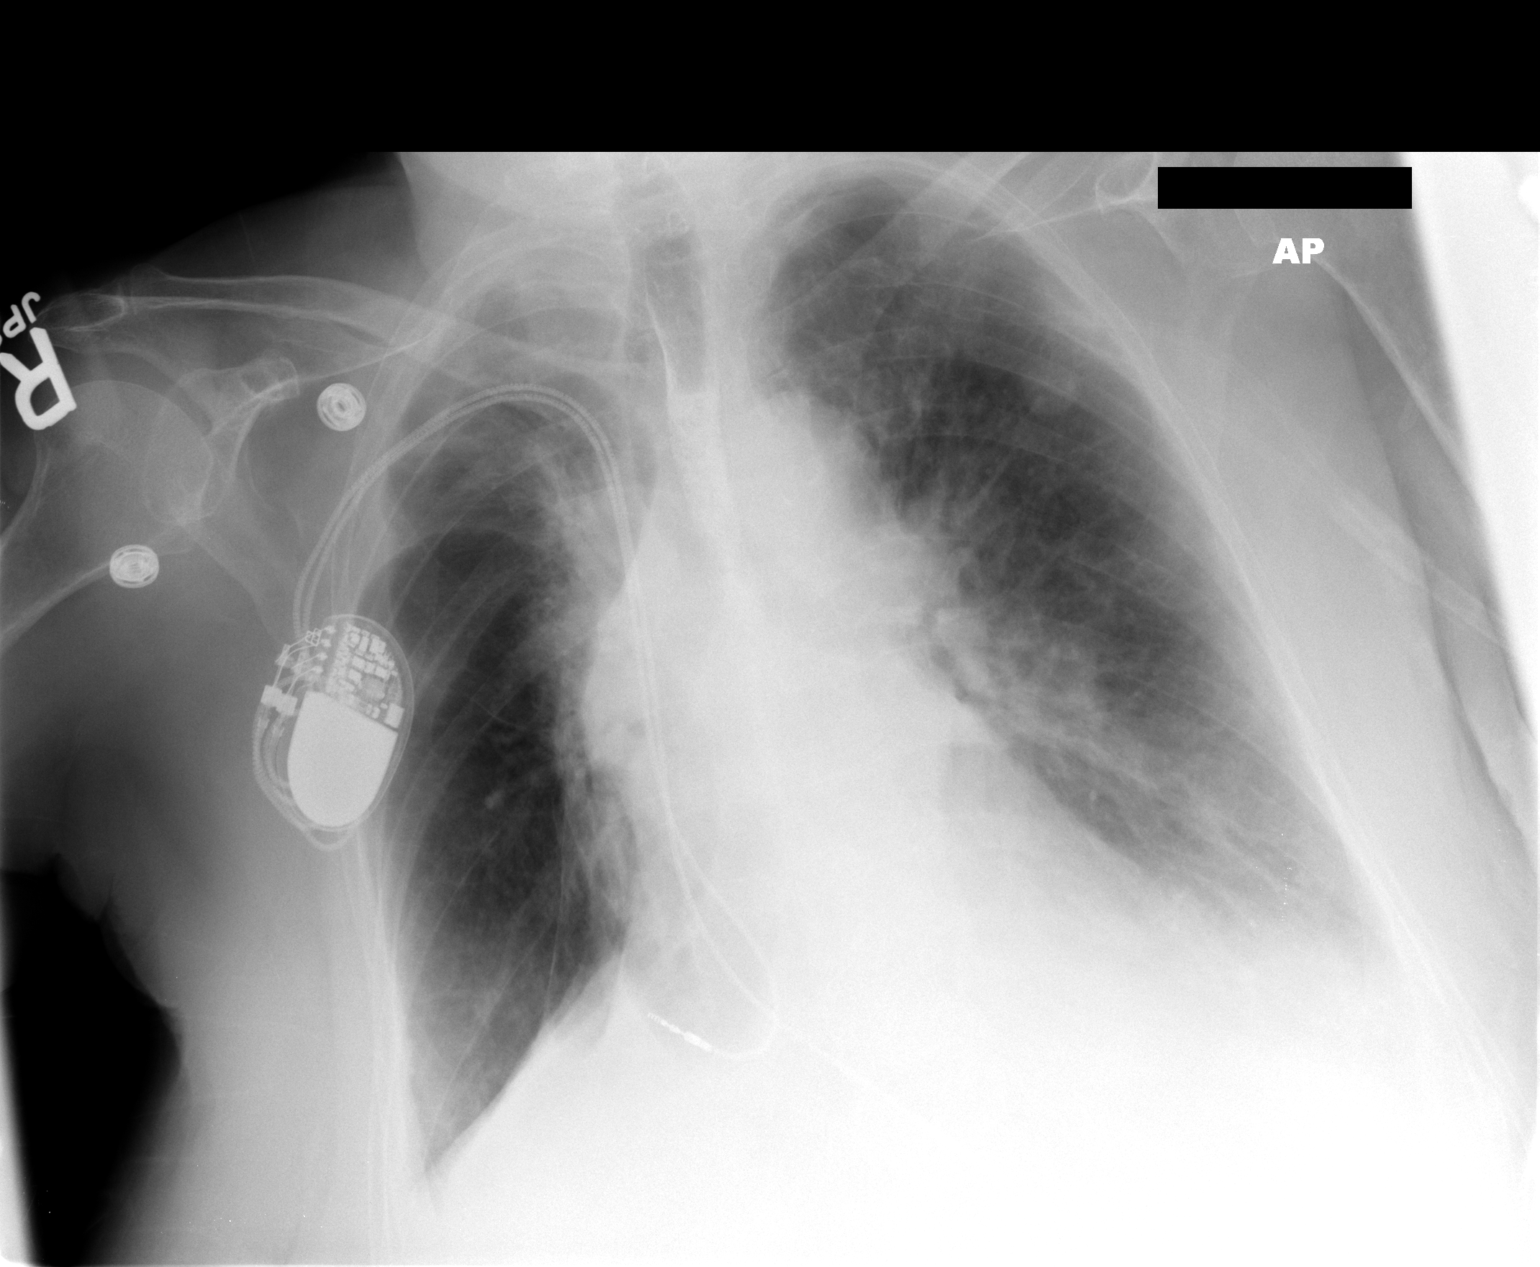

[1 of 1 positions shown; findings below may reference images not displayed]

FINDINGS: The right pleural effusion has decreased following right thoracentesis.  There is no pneumothorax.  There is improved aeration of the right lung intervally.
IMPRESSION: Interval decrease in the right pleural effusion following right thoracentesis.  Improved aeration of the right lung.  No pneumothorax.

## 2007-11-22 ENCOUNTER — Ambulatory Visit: Payer: Self-pay | Admitting: Hematology & Oncology

## 2007-12-12 LAB — COMPREHENSIVE METABOLIC PANEL
AST: 19 U/L (ref 0–37)
Alkaline Phosphatase: 96 U/L (ref 39–117)
BUN: 35 mg/dL — ABNORMAL HIGH (ref 6–23)
Calcium: 9.3 mg/dL (ref 8.4–10.5)
Creatinine, Ser: 1.72 mg/dL — ABNORMAL HIGH (ref 0.40–1.20)

## 2007-12-12 LAB — CBC WITH DIFFERENTIAL (CANCER CENTER ONLY)
BASO%: 0.5 % (ref 0.0–2.0)
HCT: 36.1 % (ref 34.8–46.6)
LYMPH%: 23.1 % (ref 14.0–48.0)
MCHC: 34.2 g/dL (ref 32.0–36.0)
MCV: 94 fL (ref 81–101)
MONO#: 0.5 10*3/uL (ref 0.1–0.9)
NEUT%: 65.7 % (ref 39.6–80.0)
RDW: 12.9 % (ref 10.5–14.6)
WBC: 6.7 10*3/uL (ref 3.9–10.0)

## 2008-01-10 ENCOUNTER — Encounter: Payer: Self-pay | Admitting: Hematology & Oncology

## 2008-01-19 ENCOUNTER — Encounter (INDEPENDENT_AMBULATORY_CARE_PROVIDER_SITE_OTHER): Payer: Self-pay | Admitting: Interventional Radiology

## 2008-01-19 ENCOUNTER — Ambulatory Visit (HOSPITAL_COMMUNITY): Admission: RE | Admit: 2008-01-19 | Discharge: 2008-01-19 | Payer: Self-pay | Admitting: Interventional Radiology

## 2008-02-06 ENCOUNTER — Encounter: Payer: Self-pay | Admitting: Interventional Radiology

## 2008-02-07 ENCOUNTER — Ambulatory Visit: Payer: Self-pay | Admitting: Hematology & Oncology

## 2008-05-16 ENCOUNTER — Ambulatory Visit: Payer: Self-pay | Admitting: Hematology & Oncology

## 2008-06-05 ENCOUNTER — Ambulatory Visit: Payer: Self-pay | Admitting: Internal Medicine

## 2008-06-07 ENCOUNTER — Encounter: Payer: Self-pay | Admitting: Internal Medicine

## 2008-07-18 ENCOUNTER — Telehealth: Payer: Self-pay | Admitting: Family Medicine

## 2008-09-27 ENCOUNTER — Telehealth (INDEPENDENT_AMBULATORY_CARE_PROVIDER_SITE_OTHER): Payer: Self-pay | Admitting: *Deleted

## 2008-10-17 ENCOUNTER — Encounter (INDEPENDENT_AMBULATORY_CARE_PROVIDER_SITE_OTHER): Payer: Self-pay | Admitting: *Deleted

## 2008-12-03 ENCOUNTER — Encounter (INDEPENDENT_AMBULATORY_CARE_PROVIDER_SITE_OTHER): Payer: Self-pay | Admitting: *Deleted

## 2009-01-04 ENCOUNTER — Ambulatory Visit: Payer: Self-pay | Admitting: Hematology & Oncology

## 2009-01-04 LAB — CBC WITH DIFFERENTIAL (CANCER CENTER ONLY)
Eosinophils Absolute: 0.2 10*3/uL (ref 0.0–0.5)
HCT: 32.9 % — ABNORMAL LOW (ref 34.8–46.6)
LYMPH%: 17 % (ref 14.0–48.0)
MCV: 86 fL (ref 81–101)
MONO#: 0.4 10*3/uL (ref 0.1–0.9)
NEUT%: 73.7 % (ref 39.6–80.0)
RBC: 3.83 10*6/uL (ref 3.70–5.32)
WBC: 6.9 10*3/uL (ref 3.9–10.0)

## 2009-01-07 LAB — PREALBUMIN: Prealbumin: 32.4 mg/dL (ref 18.0–45.0)

## 2009-01-07 LAB — COMPREHENSIVE METABOLIC PANEL
Albumin: 4.3 g/dL (ref 3.5–5.2)
CO2: 22 mEq/L (ref 19–32)
Calcium: 9 mg/dL (ref 8.4–10.5)
Glucose, Bld: 102 mg/dL — ABNORMAL HIGH (ref 70–99)
Potassium: 4.9 mEq/L (ref 3.5–5.3)
Sodium: 139 mEq/L (ref 135–145)
Total Protein: 7.1 g/dL (ref 6.0–8.3)

## 2009-01-07 LAB — VITAMIN D 25 HYDROXY (VIT D DEFICIENCY, FRACTURES): Vit D, 25-Hydroxy: 29 ng/mL — ABNORMAL LOW (ref 30–89)

## 2009-02-05 ENCOUNTER — Ambulatory Visit: Payer: Self-pay | Admitting: Internal Medicine

## 2009-02-05 DIAGNOSIS — I495 Sick sinus syndrome: Secondary | ICD-10-CM | POA: Insufficient documentation

## 2009-02-05 DIAGNOSIS — I4891 Unspecified atrial fibrillation: Secondary | ICD-10-CM | POA: Insufficient documentation

## 2009-02-22 ENCOUNTER — Telehealth: Payer: Self-pay | Admitting: Family Medicine

## 2009-04-30 ENCOUNTER — Ambulatory Visit (HOSPITAL_COMMUNITY): Admission: RE | Admit: 2009-04-30 | Discharge: 2009-04-30 | Payer: Self-pay | Admitting: Otolaryngology

## 2009-05-08 ENCOUNTER — Ambulatory Visit: Payer: Self-pay | Admitting: Internal Medicine

## 2009-05-16 ENCOUNTER — Encounter: Payer: Self-pay | Admitting: Internal Medicine

## 2009-07-24 ENCOUNTER — Ambulatory Visit: Payer: Self-pay | Admitting: Hematology & Oncology

## 2009-08-07 ENCOUNTER — Ambulatory Visit: Payer: Self-pay | Admitting: Internal Medicine

## 2009-08-08 LAB — CBC WITH DIFFERENTIAL (CANCER CENTER ONLY)
BASO%: 0.2 % (ref 0.0–2.0)
LYMPH%: 20.3 % (ref 14.0–48.0)
MCV: 87 fL (ref 81–101)
MONO#: 0.3 10*3/uL (ref 0.1–0.9)
MONO%: 4.9 % (ref 0.0–13.0)
Platelets: 161 10*3/uL (ref 145–400)
RDW: 14.3 % (ref 10.5–14.6)
WBC: 6.4 10*3/uL (ref 3.9–10.0)

## 2009-08-08 LAB — COMPREHENSIVE METABOLIC PANEL
ALT: 9 U/L (ref 0–35)
AST: 13 U/L (ref 0–37)
Alkaline Phosphatase: 51 U/L (ref 39–117)
Chloride: 101 mEq/L (ref 96–112)
Creatinine, Ser: 1.65 mg/dL — ABNORMAL HIGH (ref 0.40–1.20)
Total Bilirubin: 0.3 mg/dL (ref 0.3–1.2)

## 2009-08-08 LAB — VITAMIN D 25 HYDROXY (VIT D DEFICIENCY, FRACTURES): Vit D, 25-Hydroxy: 43 ng/mL (ref 30–89)

## 2009-08-12 ENCOUNTER — Encounter: Payer: Self-pay | Admitting: Internal Medicine

## 2009-08-26 ENCOUNTER — Observation Stay (HOSPITAL_COMMUNITY): Admission: EM | Admit: 2009-08-26 | Discharge: 2009-08-29 | Payer: Self-pay | Admitting: Emergency Medicine

## 2009-11-06 ENCOUNTER — Ambulatory Visit: Payer: Self-pay | Admitting: Internal Medicine

## 2009-11-27 ENCOUNTER — Encounter: Payer: Self-pay | Admitting: Internal Medicine

## 2010-02-07 ENCOUNTER — Encounter (INDEPENDENT_AMBULATORY_CARE_PROVIDER_SITE_OTHER): Payer: Self-pay | Admitting: *Deleted

## 2010-02-21 ENCOUNTER — Ambulatory Visit (HOSPITAL_BASED_OUTPATIENT_CLINIC_OR_DEPARTMENT_OTHER): Payer: Medicare Other | Admitting: Hematology & Oncology

## 2010-02-24 LAB — CBC WITH DIFFERENTIAL (CANCER CENTER ONLY)
EOS%: 2.9 % (ref 0.0–7.0)
HGB: 12.2 g/dL (ref 11.6–15.9)
LYMPH#: 1.5 10*3/uL (ref 0.9–3.3)
MCH: 30.9 pg (ref 26.0–34.0)
MCHC: 33.8 g/dL (ref 32.0–36.0)
MONO%: 6.5 % (ref 0.0–13.0)
NEUT#: 4.5 10*3/uL (ref 1.5–6.5)
Platelets: 158 10*3/uL (ref 145–400)
RBC: 3.94 10*6/uL (ref 3.70–5.32)

## 2010-04-22 ENCOUNTER — Ambulatory Visit: Payer: Self-pay | Admitting: Internal Medicine

## 2010-04-22 DIAGNOSIS — R42 Dizziness and giddiness: Secondary | ICD-10-CM

## 2010-04-22 LAB — CONVERTED CEMR LAB
Chloride: 105 meq/L (ref 96–112)
GFR calc non Af Amer: 32.56 mL/min — ABNORMAL LOW (ref >60.00)
Magnesium: 1.6 mg/dL (ref 1.5–2.5)
Potassium: 5.2 meq/L — ABNORMAL HIGH (ref 3.5–5.1)
Sodium: 140 meq/L (ref 135–145)
TSH: 2.47 microintl units/mL (ref 0.35–5.50)

## 2010-05-31 ENCOUNTER — Encounter: Payer: Self-pay | Admitting: Hematology & Oncology

## 2010-06-01 ENCOUNTER — Encounter: Payer: Self-pay | Admitting: Pulmonary Disease

## 2010-06-10 NOTE — Letter (Signed)
Summary: Remote Device Check  [facility], Main Office  [address]   [place], [place] [address]   Phone: [phone]  Fax: [phone]     November 27, 2009 MRN: [id number]   [facility] [address] [place], [place]  [address]   Dear Ms. Schubring,   Your remote transmission was recieved and reviewed by your physician.  All diagnostics were within normal limits for you.   __X____Your next office visit is scheduled for:  September with Dr Graciela Husbands. Please call our office to schedule an appointment.    Sincerely,  Vella Kohler

## 2010-06-10 NOTE — Cardiovascular Report (Signed)
Summary: Office Visit Remote   Office Visit Remote   Imported By: Roderic Ovens 08/20/2009 16:41:38  _____________________________________________________________________  External Attachment:    Type:   Image     Comment:   External Document

## 2010-06-10 NOTE — Letter (Signed)
Summary: Device-Delinquent Check   [facility], Main Office  [address]   [place], [place] [address]   Phone: [phone]  Fax: [phone]     February 07, 2010 MRN: [id number]   [facility] [address] [place], [place]  [address]   Dear Ms. Hatcher,  According to our records, you have not had your implanted device checked in the recommended period of time.  We are unable to determine appropriate device function without checking your device on a regular basis.  Please call our office to schedule an appointment  with Dr Ladona Ridgel, as soon as possible.  If you are having your device checked by another physician, please call us so that we may update our records.  Thank you,  Letta Moynahan, EMT  February 07, 2010 1:46 PM  [facility]

## 2010-06-10 NOTE — Letter (Signed)
Summary: Remote Device Check  [facility], Main Office  [address]   [place], [place] [address]   Phone: [phone]  Fax: [phone]     May 16, 2009 MRN: [id number]   [facility] [address] [place], [place]  [address]   Dear Ms. Reihl,   Your remote transmission was recieved and reviewed by your physician.  All diagnostics were within normal limits for you.  __X___Your next transmission is scheduled for:  August 07, 2009.  Please transmit at any time this day.  If you have a wireless device your transmission will be sent automatically.     Sincerely,  Proofreader

## 2010-06-10 NOTE — Cardiovascular Report (Signed)
Summary: Office Visit Remote   Office Visit Remote   Imported By: Roderic Ovens 05/22/2009 15:04:14  _____________________________________________________________________  External Attachment:    Type:   Image     Comment:   External Document

## 2010-06-10 NOTE — Letter (Signed)
Summary: Remote Device Check  [facility], Main Office  [address]   [place], [place] [address]   Phone: [phone]  Fax: [phone]     August 12, 2009 MRN: [id number]   [facility] [address] [place], [place]  [address]   Dear [name],   Your remote transmission was recieved and reviewed by your physician.  All diagnostics were within normal limits for you.  __X___Your next transmission is scheduled for:  November 06, 2009.  Please transmit at any time this day.  If you have a wireless device your transmission will be sent automatically.     Sincerely,  Proofreader

## 2010-06-12 NOTE — Assessment & Plan Note (Signed)
Summary: pacer ck.mt   Visit Type:  PPM-Medtronic  CC:  shortness of breath.  History of Present Illness: [name] is seen in followup for PAF with bradycardia and syncope requiring pacemaker ;  she has diffuse moderately obstructive coronary disease with normal left ventricular function.  She has orthostatic lightheadedness.  Reviewed records from hospitalization in April, she also is having orthostasis and blood pressure medications were reduced.  Review of those laboratories demonstrated no TSH was drawn;  magnesium was overlying low.     Problems Prior to Update: 1)  Pacemaker,mdt Ddd  (ICD-V45.01) 2)  Sick Sinus/ Tachy-brady Syndrome  (ICD-427.81) 3)  Atrial Fibrillation  (ICD-427.31) 4)  Arthritis, Cervical Spine  (ICD-721.90) 5)  C A D  (ICD-414.00) 6)  Dm, Uncomplicated, Type I  (ICD-250.01) 7)  Family History Diabetes 1st Degree Relative  (ICD-V18.0) 8)  Family History Breast Cancer 1st Degree Relative <50  (ICD-V16.3) 9)  Family History of Alcoholism/addiction  (ICD-V61.41) 10)  Diverticulitis, Hx of  (ICD-V12.79) 11)  Diabetes Mellitus, Type II  (ICD-250.00) 12)  Depression  (ICD-311)  Current Medications (verified): 1)  Lovastatin 20 Mg Tabs (Lovastatin) .... At Bedtime 2)  Famotidine 20 Mg  Tabs (Famotidine) .... Two Times A Day 3)  Carvedilol 12.5 Mg Tabs (Carvedilol) .... Take One  and 1/2 Tablet Two Times A Day 4)  Ferrousul 325 (65 Fe) Mg  Tabs (Ferrous Sulfate) .... Once Daily 5)  [facility]vs Acetaminophen 325 Mg  Tabs (Acetaminophen) .... Two Tabs Qid 6)  Oxycodone-Acetaminophen 5-325 Mg  Tabs (Oxycodone-Acetaminophen) .... One Q6h As Needed Pain 7)  Diclofenac Sodium 75 Mg  Tbec (Diclofenac Sodium) .... Two Times A Day After Meals 8)  Lovastatin 40 Mg Tabs (Lovastatin) .... At Bedtime 9)  Citalopram Hydrobromide 20 Mg Tabs (Citalopram Hydrobromide) .... Take One Tablet By Mouth At Bedtime 10)  Alendronate Sodium 70 Mg Tabs (Alendronate Sodium) .... Once [facility] 11)   Meclizine Hcl 25 Mg Tabs (Meclizine Hcl) .... Take One Tablet Three Times A Day 12)  Metformin Hcl 500 Mg Tabs (Metformin Hcl) .... Take One Tablet Two Times A Day 13)  Niacin Cr 500 Mg Cr-Caps (Niacin) .... At Bedtime 14)  Trazodone Hcl 50 Mg Tabs (Trazodone Hcl) .Marland Kitchen.. 1 & 1/2 At Bedtime 15)  Vitamin D 400 Unit Caps (Cholecalciferol) .... 2 Tab Once Daily 16)  Warfarin Sodium 1 Mg Tabs (Warfarin Sodium) .... 3 Mg Use As Directed By Anticoagualtion Clinic  Allergies (verified): No Known Drug Allergies  Past History:  Past Medical History: Last updated: 02/04/2009 Atrial fibrillation Pacemaker-Medtronic Adapta [id number]  Diabetes mellitus, type II Headache/Migraines Ulcers Kidney Stones Diverticulitis, hx of  Past Surgical History: Last updated: 02/04/2009 Kidney Tumor Lung CA Cholecystectomy Hysterectomy Breast Biopsy Pacemaker implantation  Family History: Last updated: 12/22/2006 Family History of Alcoholism/Addiction Family History Breast cancer 1st degree relative <50 Family History Diabetes 1st degree relative  Social History: Last updated: 12/22/2006 Retired Married Former Smoker Alcohol use-no Drug use-no Regular exercise-no  Risk Factors: Exercise: no (12/22/2006)  Risk Factors: Smoking Status: quit (12/22/2006)  Vital Signs:  Patient profile:   77 year old female Height:      64 inches Weight:      135.25 pounds BMI:     23.30 Pulse rate:   70 / minute BP sitting:   98 / 55  (left arm) Cuff size:   regular  Vitals Entered By: Caralee Ates CMA (April 22, 2010 12:44 PM)  Physical Exam  General:  The patient  was alert and oriented in no acute distress. HEENT Normal.  Neck veins were flat, carotids were brisk.  Lungs were clear.  Heart sounds were regular without murmurs or gallops.  Abdomen was soft with active bowel sounds. There is no clubbing cyanosis or edema. Skin Warm and dry    PPM Specifications Following MD:  Sherryl Manges, MD      PPM Vendor:  Medtronic     PPM Model Number:  [id number]     PPM Serial Number:  [id number] PPM DOI:  02/15/2006     PPM Implanting MD:  Sherryl Manges, MD  Lead 1    Location: RA     DOI: 02/15/2006     Model #: [id number]     Serial #: [id number]     Status: active Lead 2    Location: RV     DOI: 02/15/2006     Model #: 8469     Serial #: [id number]     Status: active  Magnet Response Rate:  BOL 85 ERI 65  Indications:  SYNCOPE, AFIB   PPM Follow Up Remote Check?  No Battery Voltage:  2.77 V     Battery Est. Longevity:  6 years     Pacer Dependent:  No       PPM Device Measurements Atrium  Amplitude: 1.0 mV, Impedance: 340 ohms, Threshold: 0.875 V at 0.4 msec Right Ventricle  Amplitude: 11.20 mV, Impedance: 507 ohms, Threshold: 0.75 V at 0.4 msec  Episodes MS Episodes:  1     Percent Mode Switch:  <0.1%     Atrial Pacing:  10.5%     Ventricular Pacing:  0.4%  Parameters Mode:  MVP (R)     Lower Rate Limit:  60     Upper Rate Limit:  130 Paced AV Delay:  180     Sensed AV Delay:  150 Next Remote Date:  07/24/2010     Next Cardiology Appt Due:  04/11/2011 Tech Comments:  No parameter changes.  Device function normal.   Carelink transmissions every 3 months.  ROV 1 year with Dr. Graciela Husbands. Altha Harm, LPN  April 22, 2010 12:56 PM   Impression & Recommendations:  Problem # 1:  PACEMAKER,MDT DDD (ICD-V45.01) Device parameters and data were reviewed and no changes were made  Problem # 2:  ATRIAL FIBRILLATION (ICD-427.31)  she has had no intercurrent atrial fibrillation as detected by her device Her updated medication list for this problem includes:    Carvedilol 12.5 Mg Tabs (Carvedilol) ..... One half tablet two times a day    Warfarin Sodium 1 Mg Tabs (Warfarin sodium) .Marland KitchenMarland KitchenMarland KitchenMarland Kitchen 3 mg use as directed by anticoagualtion clinic  Her updated medication list for this problem includes:    Carvedilol 12.5 Mg Tabs (Carvedilol) ..... One half tablet two times a day    Warfarin Sodium 1 Mg Tabs  (Warfarin sodium) .Marland KitchenMarland KitchenMarland KitchenMarland Kitchen 3 mg use as directed by anticoagualtion clinic  Orders: TLB-BMP (Basic Metabolic Panel-BMET) (80048-METABOL) TLB-TSH (Thyroid Stimulating Hormone) (84443-TSH) TLB-Magnesium (Mg) (83735-MG)  Problem # 3:  SICK SINUS/ TACHY-BRADY SYNDROME (ICD-427.81) she is atrially paced 10% of the time.  we will decrease her carvedilol today orthostasis this may well further reduce her pacing  Her updated medication list for this problem includes:    Carvedilol 12.5 Mg Tabs (Carvedilol) ..... One half tablet two times a day    Warfarin Sodium 1 Mg Tabs (Warfarin sodium) .Marland KitchenMarland KitchenMarland KitchenMarland Kitchen 3 mg use as directed by anticoagualtion clinic  Orders: TLB-BMP (Basic  Metabolic Panel-BMET) (80048-METABOL) TLB-TSH (Thyroid Stimulating Hormone) (84443-TSH) TLB-Magnesium (Mg) (83735-MG)  Problem # 4:  ORTHOSTATIC DIZZINESS (ICD-780.4) wwill reduce her carvedilol as she had a low blood pressure and orthostasis. Last echo was done 2007 and it demonstrated normal left ventricular function  Patient Instructions: 1)  Your physician has recommended you make the following change in your medication: Take 1/2 tablet of Carvedilol two times a day. 2)  Your physician wants you to follow-up in:  1 year.  You will receive a reminder letter in the mail two months in advance. If you don't receive a letter, please call our office to schedule the follow-up appointment. 3)  Your physician recommends that you have labs today.

## 2010-06-13 NOTE — Cardiovascular Report (Signed)
Summary: Office Visit Remote   Office Visit Remote   Imported By: Roderic Ovens 11/28/2009 11:05:02  _____________________________________________________________________  External Attachment:    Type:   Image     Comment:   External Document

## 2010-06-19 ENCOUNTER — Emergency Department (HOSPITAL_COMMUNITY): Payer: Medicare Other

## 2010-06-19 ENCOUNTER — Inpatient Hospital Stay (HOSPITAL_COMMUNITY)
Admission: EM | Admit: 2010-06-19 | Discharge: 2010-06-23 | DRG: 391 | Disposition: A | Discharge status: Home Health | Payer: Medicare Other | Attending: Internal Medicine | Admitting: Internal Medicine

## 2010-06-19 DIAGNOSIS — C3410 Malignant neoplasm of upper lobe, unspecified bronchus or lung: Secondary | ICD-10-CM | POA: Diagnosis present

## 2010-06-19 DIAGNOSIS — I2510 Atherosclerotic heart disease of native coronary artery without angina pectoris: Secondary | ICD-10-CM | POA: Diagnosis present

## 2010-06-19 DIAGNOSIS — D62 Acute posthemorrhagic anemia: Secondary | ICD-10-CM | POA: Diagnosis present

## 2010-06-19 DIAGNOSIS — R413 Other amnesia: Secondary | ICD-10-CM | POA: Diagnosis present

## 2010-06-19 DIAGNOSIS — Z794 Long term (current) use of insulin: Secondary | ICD-10-CM

## 2010-06-19 DIAGNOSIS — K222 Esophageal obstruction: Secondary | ICD-10-CM | POA: Diagnosis present

## 2010-06-19 DIAGNOSIS — I4891 Unspecified atrial fibrillation: Secondary | ICD-10-CM | POA: Diagnosis present

## 2010-06-19 DIAGNOSIS — M810 Age-related osteoporosis without current pathological fracture: Secondary | ICD-10-CM | POA: Diagnosis present

## 2010-06-19 DIAGNOSIS — K219 Gastro-esophageal reflux disease without esophagitis: Secondary | ICD-10-CM | POA: Diagnosis present

## 2010-06-19 DIAGNOSIS — T45515A Adverse effect of anticoagulants, initial encounter: Secondary | ICD-10-CM | POA: Diagnosis present

## 2010-06-19 DIAGNOSIS — E785 Hyperlipidemia, unspecified: Secondary | ICD-10-CM | POA: Diagnosis present

## 2010-06-19 DIAGNOSIS — Z87891 Personal history of nicotine dependence: Secondary | ICD-10-CM

## 2010-06-19 DIAGNOSIS — D689 Coagulation defect, unspecified: Secondary | ICD-10-CM

## 2010-06-19 DIAGNOSIS — K5900 Constipation, unspecified: Secondary | ICD-10-CM | POA: Diagnosis present

## 2010-06-19 DIAGNOSIS — F3289 Other specified depressive episodes: Secondary | ICD-10-CM | POA: Diagnosis present

## 2010-06-19 DIAGNOSIS — K449 Diaphragmatic hernia without obstruction or gangrene: Secondary | ICD-10-CM | POA: Diagnosis present

## 2010-06-19 DIAGNOSIS — Z66 Do not resuscitate: Secondary | ICD-10-CM | POA: Diagnosis present

## 2010-06-19 DIAGNOSIS — Z85118 Personal history of other malignant neoplasm of bronchus and lung: Secondary | ICD-10-CM

## 2010-06-19 DIAGNOSIS — K208 Other esophagitis without bleeding: Principal | ICD-10-CM | POA: Diagnosis present

## 2010-06-19 DIAGNOSIS — K922 Gastrointestinal hemorrhage, unspecified: Secondary | ICD-10-CM

## 2010-06-19 DIAGNOSIS — I959 Hypotension, unspecified: Secondary | ICD-10-CM | POA: Diagnosis not present

## 2010-06-19 DIAGNOSIS — E119 Type 2 diabetes mellitus without complications: Secondary | ICD-10-CM | POA: Diagnosis present

## 2010-06-19 DIAGNOSIS — F329 Major depressive disorder, single episode, unspecified: Secondary | ICD-10-CM | POA: Diagnosis present

## 2010-06-19 DIAGNOSIS — Z7901 Long term (current) use of anticoagulants: Secondary | ICD-10-CM

## 2010-06-19 DIAGNOSIS — I10 Essential (primary) hypertension: Secondary | ICD-10-CM | POA: Diagnosis present

## 2010-06-19 DIAGNOSIS — N390 Urinary tract infection, site not specified: Secondary | ICD-10-CM | POA: Diagnosis present

## 2010-06-19 DIAGNOSIS — K228 Other specified diseases of esophagus: Secondary | ICD-10-CM | POA: Diagnosis present

## 2010-06-19 DIAGNOSIS — Z950 Presence of cardiac pacemaker: Secondary | ICD-10-CM

## 2010-06-19 DIAGNOSIS — R791 Abnormal coagulation profile: Secondary | ICD-10-CM | POA: Diagnosis present

## 2010-06-19 DIAGNOSIS — K2289 Other specified disease of esophagus: Secondary | ICD-10-CM | POA: Diagnosis present

## 2010-06-19 LAB — COMPREHENSIVE METABOLIC PANEL
CO2: 23 mEq/L (ref 19–32)
Calcium: 9.4 mg/dL (ref 8.4–10.5)
Creatinine, Ser: 1.8 mg/dL — ABNORMAL HIGH (ref 0.4–1.2)
GFR calc Af Amer: 33 mL/min — ABNORMAL LOW (ref >60)
GFR calc non Af Amer: 27 mL/min — ABNORMAL LOW (ref >60)
Glucose, Bld: 91 mg/dL (ref 70–99)
Sodium: 141 mEq/L (ref 135–145)
Total Protein: 6.4 g/dL (ref 6.0–8.3)

## 2010-06-19 LAB — DIFFERENTIAL
Basophils Relative: 0 % (ref 0–1)
Eosinophils Absolute: 0.1 10*3/uL (ref 0.0–0.7)
Eosinophils Relative: 2 % (ref 0–5)
Monocytes Absolute: 0.3 10*3/uL (ref 0.1–1.0)
Monocytes Relative: 6 % (ref 3–12)

## 2010-06-19 LAB — GLUCOSE, CAPILLARY

## 2010-06-19 LAB — CBC
HCT: 21.1 % — ABNORMAL LOW (ref 36.0–46.0)
Hemoglobin: 10.2 g/dL — ABNORMAL LOW (ref 12.0–15.0)
MCHC: 33.2 g/dL (ref 30.0–36.0)
MCHC: 33.7 g/dL (ref 30.0–36.0)
Platelets: 157 10*3/uL (ref 150–400)
RDW: 15.1 % (ref 11.5–15.5)
WBC: 5.8 10*3/uL (ref 4.0–10.5)
WBC: 8.4 10*3/uL (ref 4.0–10.5)

## 2010-06-19 LAB — PROTIME-INR
INR: >10 (ref 0.00–1.49)
Prothrombin Time: >90 seconds — ABNORMAL HIGH (ref 11.6–15.2)
Prothrombin Time: >90 seconds — ABNORMAL HIGH (ref 11.6–15.2)

## 2010-06-19 LAB — URINE MICROSCOPIC-ADD ON

## 2010-06-19 LAB — URINALYSIS, ROUTINE W REFLEX MICROSCOPIC
Protein, ur: NEGATIVE mg/dL
Urine Glucose, Fasting: NEGATIVE mg/dL

## 2010-06-19 LAB — CARDIAC PANEL(CRET KIN+CKTOT+MB+TROPI): CK, MB: 0.9 ng/mL (ref 0.3–4.0)

## 2010-06-19 LAB — POCT CARDIAC MARKERS
CKMB, poc: <1 ng/mL — ABNORMAL LOW (ref 1.0–8.0)
Myoglobin, poc: 89.5 ng/mL (ref 12–200)

## 2010-06-19 LAB — CK TOTAL AND CKMB (NOT AT ARMC): CK, MB: 0.9 ng/mL (ref 0.3–4.0)

## 2010-06-19 LAB — APTT: aPTT: 68 seconds — ABNORMAL HIGH (ref 24–37)

## 2010-06-19 LAB — OCCULT BLOOD, POC DEVICE: Fecal Occult Bld: POSITIVE

## 2010-06-20 ENCOUNTER — Encounter: Payer: Self-pay | Admitting: Internal Medicine

## 2010-06-20 LAB — CBC
HCT: 31.4 % — ABNORMAL LOW (ref 36.0–46.0)
HCT: 28.2 % — ABNORMAL LOW (ref 36.0–46.0)
Hemoglobin: 10.7 g/dL — ABNORMAL LOW (ref 12.0–15.0)
Hemoglobin: 9.8 g/dL — ABNORMAL LOW (ref 12.0–15.0)
Hemoglobin: 9.6 g/dL — ABNORMAL LOW (ref 12.0–15.0)
MCH: 29.4 pg (ref 26.0–34.0)
MCH: 29.5 pg (ref 26.0–34.0)
MCH: 29.5 pg (ref 26.0–34.0)
MCHC: 34.1 g/dL (ref 30.0–36.0)
MCHC: 34.8 g/dL (ref 30.0–36.0)
Platelets: 130 10*3/uL — ABNORMAL LOW (ref 150–400)
RBC: 3.32 MIL/uL — ABNORMAL LOW (ref 3.87–5.11)
RBC: 3.25 MIL/uL — ABNORMAL LOW (ref 3.87–5.11)
WBC: 6 10*3/uL (ref 4.0–10.5)

## 2010-06-20 LAB — GLUCOSE, CAPILLARY
Glucose-Capillary: 94 mg/dL (ref 70–99)
Glucose-Capillary: 91 mg/dL (ref 70–99)
Glucose-Capillary: 97 mg/dL (ref 70–99)

## 2010-06-20 LAB — BASIC METABOLIC PANEL
CO2: 26 mEq/L (ref 19–32)
Glucose, Bld: 90 mg/dL (ref 70–99)
Potassium: 4.4 mEq/L (ref 3.5–5.1)
Sodium: 140 mEq/L (ref 135–145)

## 2010-06-20 LAB — CROSSMATCH
ABO/RH(D): A POS
Antibody Screen: NEGATIVE
Unit division: 0

## 2010-06-20 LAB — CARDIAC PANEL(CRET KIN+CKTOT+MB+TROPI)
CK, MB: 1 ng/mL (ref 0.3–4.0)
Total CK: 43 U/L (ref 7–177)

## 2010-06-20 LAB — HEMOGLOBIN A1C: Mean Plasma Glucose: 103 mg/dL (ref <117)

## 2010-06-20 LAB — LIPID PANEL
Cholesterol: 135 mg/dL (ref 0–200)
HDL: 27 mg/dL — ABNORMAL LOW (ref >39)
Triglycerides: 237 mg/dL — ABNORMAL HIGH (ref <150)

## 2010-06-20 LAB — MRSA PCR SCREENING: MRSA by PCR: NEGATIVE

## 2010-06-21 DIAGNOSIS — K210 Gastro-esophageal reflux disease with esophagitis: Secondary | ICD-10-CM

## 2010-06-21 LAB — DIFFERENTIAL
Basophils Absolute: 0 10*3/uL (ref 0.0–0.1)
Eosinophils Relative: 6 % — ABNORMAL HIGH (ref 0–5)
Lymphocytes Relative: 19 % (ref 12–46)
Lymphs Abs: 1.1 10*3/uL (ref 0.7–4.0)
Neutro Abs: 3.6 10*3/uL (ref 1.7–7.7)

## 2010-06-21 LAB — PREPARE FRESH FROZEN PLASMA: Unit division: 0

## 2010-06-21 LAB — CBC
HCT: 31.4 % — ABNORMAL LOW (ref 36.0–46.0)
HCT: 30.8 % — ABNORMAL LOW (ref 36.0–46.0)
Hemoglobin: 10.5 g/dL — ABNORMAL LOW (ref 12.0–15.0)
Hemoglobin: 10.5 g/dL — ABNORMAL LOW (ref 12.0–15.0)
Hemoglobin: 10.4 g/dL — ABNORMAL LOW (ref 12.0–15.0)
MCH: 29.5 pg (ref 26.0–34.0)
MCHC: 33.7 g/dL (ref 30.0–36.0)
MCHC: 33.8 g/dL (ref 30.0–36.0)
MCV: 87.5 fL (ref 78.0–100.0)
RBC: 3.59 MIL/uL — ABNORMAL LOW (ref 3.87–5.11)
RDW: 14.9 % (ref 11.5–15.5)
RDW: 15.2 % (ref 11.5–15.5)
WBC: 5.7 10*3/uL (ref 4.0–10.5)
WBC: 6 10*3/uL (ref 4.0–10.5)

## 2010-06-21 LAB — GLUCOSE, CAPILLARY
Glucose-Capillary: 100 mg/dL — ABNORMAL HIGH (ref 70–99)
Glucose-Capillary: 100 mg/dL — ABNORMAL HIGH (ref 70–99)

## 2010-06-21 LAB — BASIC METABOLIC PANEL
CO2: 27 mEq/L (ref 19–32)
Chloride: 104 mEq/L (ref 96–112)
Creatinine, Ser: 1.08 mg/dL (ref 0.4–1.2)
GFR calc Af Amer: 60 mL/min — ABNORMAL LOW (ref >60)
Potassium: 4.6 mEq/L (ref 3.5–5.1)
Sodium: 139 mEq/L (ref 135–145)

## 2010-06-21 LAB — PROTIME-INR: INR: 1.22 (ref 0.00–1.49)

## 2010-06-22 LAB — CARDIAC PANEL(CRET KIN+CKTOT+MB+TROPI)
CK, MB: 1 ng/mL (ref 0.3–4.0)
Relative Index: INVALID (ref 0.0–2.5)
Total CK: 32 U/L (ref 7–177)

## 2010-06-22 LAB — GLUCOSE, CAPILLARY
Glucose-Capillary: 108 mg/dL — ABNORMAL HIGH (ref 70–99)
Glucose-Capillary: 121 mg/dL — ABNORMAL HIGH (ref 70–99)
Glucose-Capillary: 158 mg/dL — ABNORMAL HIGH (ref 70–99)
Glucose-Capillary: 190 mg/dL — ABNORMAL HIGH (ref 70–99)

## 2010-06-22 LAB — URINALYSIS, ROUTINE W REFLEX MICROSCOPIC
Bilirubin Urine: NEGATIVE
Hgb urine dipstick: NEGATIVE
Ketones, ur: NEGATIVE mg/dL
Urine Glucose, Fasting: NEGATIVE mg/dL
pH: 6 (ref 5.0–8.0)

## 2010-06-22 LAB — CBC
Hemoglobin: 10.6 g/dL — ABNORMAL LOW (ref 12.0–15.0)
Hemoglobin: 10.7 g/dL — ABNORMAL LOW (ref 12.0–15.0)
MCH: 29.6 pg (ref 26.0–34.0)
MCH: 29.3 pg (ref 26.0–34.0)
MCHC: 32.9 g/dL (ref 30.0–36.0)
MCV: 88 fL (ref 78.0–100.0)
Platelets: 152 10*3/uL (ref 150–400)
Platelets: 127 10*3/uL — ABNORMAL LOW (ref 150–400)
RBC: 3.58 MIL/uL — ABNORMAL LOW (ref 3.87–5.11)
RBC: 3.32 MIL/uL — ABNORMAL LOW (ref 3.87–5.11)
RDW: 14.9 % (ref 11.5–15.5)
WBC: 6.2 10*3/uL (ref 4.0–10.5)

## 2010-06-23 LAB — CBC
HCT: 31.8 % — ABNORMAL LOW (ref 36.0–46.0)
Hemoglobin: 10.7 g/dL — ABNORMAL LOW (ref 12.0–15.0)
MCH: 29.6 pg (ref 26.0–34.0)
MCH: 29.8 pg (ref 26.0–34.0)
MCHC: 33.4 g/dL (ref 30.0–36.0)
MCV: 88.6 fL (ref 78.0–100.0)
Platelets: 120 10*3/uL — ABNORMAL LOW (ref 150–400)
RBC: 3.59 MIL/uL — ABNORMAL LOW (ref 3.87–5.11)
RDW: 15 % (ref 11.5–15.5)

## 2010-06-23 LAB — CARDIAC PANEL(CRET KIN+CKTOT+MB+TROPI): Troponin I: 0.02 ng/mL (ref 0.00–0.06)

## 2010-06-24 LAB — URINE CULTURE
Colony Count: NO GROWTH
Culture: NO GROWTH

## 2010-06-25 NOTE — H&P (Signed)
[name], [name]                ACCOUNT NO.:  [id number]  MEDICAL RECORD NO.:  [id number]           PATIENT TYPE:  E  LOCATION:  [facility]                         FACILITY:  [facility]  PHYSICIAN:  Thad Ranger, MD       DATE OF BIRTH:  1931/10/31  DATE OF ADMISSION:  06/19/2010 DATE OF DISCHARGE:                             HISTORY & PHYSICAL   PRIMARY CARE PHYSICIAN:  Dr. Florentina Jenny  ONCOLOGIST:  Rose Phi. Myna Hidalgo, MD  CARDIOLOGIST:  [name], MD, [facility]  CHIEF COMPLAINT:  Shortness of breath over last 2 days and melanotic stools for the last couple of weeks.  HISTORY OF PRESENT ILLNESS:  [name] is a 77 year old female with multiple medical problems including history of hypertension, diabetes, atrial fibrillation, pacemaker placement, on chronic anticoagulation, history of remote lung cancer, presented to the [facility] from the [facility] for shortness of breath. A history was provided by the patient herself.  The patient stated that her shortness of breath has started 2 to 3 days ago and has been progressively getting worse.  The shortness of breath has been worse with exertion.  She denies any fevers, chills, any productive cough or any peripheral lower extremity swelling.  The patient states that she was having some nausea in the last 2-3 days but no vomiting or any abdominal pain.  She also noticed that her stools were getting dark and melanotic in the last couple of weeks.  She denied any dizziness, lightheadedness, or any syncopal episode.  The patient denies any EGD or colonoscopies in the past.  She is on anticoagulation with Coumadin for atrial fibrillation.  Hospitalist service was consulted for admission.  PAST MEDICAL HISTORY: 1. History of hypertension. 2. Diabetes. 3. History of coronary artery disease. 4. History of small cell lung cancer with history of radiation and     chemotherapy. 5. Atrial fibrillation. 6.  History of vertigo. 7. History of depression with some memory impairment. 8. GERD. 9. Hyperlipidemia. 10.History of arthritis.  SOCIAL HISTORY:  The patient denies any alcohol.  Former smoker with a 30-pack-year history of tobacco use.  She currently lives at assisted living facility and uses a walker for ambulation.  ALLERGIES:  No known drug allergies.  MEDICATIONS:  Prior to admission, currently abating pharmacy med reconciliation.  PHYSICAL EXAMINATION:  VITAL SIGNS:  Blood pressure 116/59, pulse rate 77, respiratory rate 18, temperature 97.4. GENERAL:  The patient is alert, awake, and oriented, not in any acute distress. HEENT:  Pale conjunctivae.  EOMI.  Pupils equally reactive to light and accommodation. NECK:  Supple.  No lymphopathy, no JVD. CARDIOVASCULAR:  S1, S2, clear. CHEST:  Fairly clear to auscultation bilaterally with decreased breath sounds at the bases.  No significant rales or rhonchi heard. ABDOMEN:  Soft, nontender, nondistended.  Normal bowel sounds. EXTREMITIES:  No cyanosis, clubbing or edema noted in upper and lower extremities bilaterally. NEUROLOGIC:  No focal neurological deficits noted.  DIAGNOSTIC DATA:  Cardiac markers point of care negative.  CBC showed white count of 5.8, hemoglobin of 7.0, hematocrit 21.1, platelets 157. CBC  in April 2011 had shown hemoglobin of 11.8, hematocrit of 35.1, fecal occult blood positive.  BNP 217.  Urinalysis is positive for UTI. INR more than 10.0.  Radiological data; chest x-ray, 2-view, volume loss in the right lung.  New opacification at the right lung apex, could represent collapse of the right upper lobe versus progressive scarring. Neoplasm not excluded.  CT chest recommended stable calcified granuloma of the left upper lobe, new calcified superior mediastinal lymph node. CT chest without contrast showed atelectasis and volume loss of the remaining portion of the right upper lobe.  There is occlusion of  the right upper lobe bronchus suggested further evaluation with PET CT to assess for residual or recurrent metabolically active tumor.  Patchy ground-glass densities and irregular nodule in the right lower lobe are nonspecific  IMPRESSION AND PLAN:  [name] is a 77 year old female who presented with shortness of breath and melanotic stools, likely secondary to chronic anemia from GI bleed: 1. Anemia with GI bleed, likely secondary to Coumadin.  PT/INR showed     coagulopathy with INR of more than 10.  Vitamin K and FFP have been     ordered.  Given the drop in the hemoglobin, we will also transfuse     2 units of packed RBCs.  GI has been consulted and discussed with     [facility] gastroenterology.  For now, we will keep the patient on     clear liquid diet and Protonix IV. 2. Urinary tract infection.  Urine culture will be obtained and the     patient will be placed on Rocephin IV. 3. Right lung opacification.  CT chest was ordered which showed     possible occlusion of the right upper lobe bronchus suggest head CT     with patchy ground-glass densities which are nonspecific.  Consider     ordering a PET CT once the patient is hemodynamically stable and     Oncology consultation with Dr. Myna Hidalgo who is the patient's primary     oncologist. 4. Atrial fibrillation, currently rate controlled.  Continue on Coreg.     No anticoagulation secondary to active GI     bleed. 5. Diabetes.  Placed on sliding scale insulin and obtain HbA1c     prophylaxis, bilateral SCDs.  CODE STATUS:  I discussed in detail with the patient.  She opted to be DNR status.     Thad Ranger, MD     RR/MEDQ  D:  06/19/2010  T:  06/19/2010  Job:  [id number]  cc:   Dr. Cleophus Molt, MD, [facility] [address]  [address] [place] [address]  Rose Phi. Myna Hidalgo, M.D. Fax: [phone]  Electronically Signed by Itzel Lowrimore  on 06/25/2010 03:11:12 PM

## 2010-06-26 LAB — CULTURE, BLOOD (ROUTINE X 2)
Culture  Setup Time: 201202100042
Culture: NO GROWTH

## 2010-06-26 NOTE — Procedures (Addendum)
Summary: Upper Endoscopy  Patient: [name] Note: All result statuses are Final unless otherwise noted.  Tests: (1) Upper Endoscopy (EGD)   EGD Upper Endoscopy       DONE     [facility]     [address] [place], [place]  [address]           ENDOSCOPY PROCEDURE REPORT           PATIENT:  [name], [name]  MR#:  [id number]     BIRTHDATE:  05-Jan-1932, 78 yrs. old  GENDER:  female           ENDOSCOPIST:  Hedwig Morton. Juanda Chance, MD     Referred by:           PROCEDURE DATE:  06/20/2010     PROCEDURE:  EGD, diagnostic 43235     ASA CLASS:  Class III     INDICATIONS:  melena, anemia pt on Fosamax, Voltaren,     Coumadin for at.fib, INR was >10 yesterday, reversed with FFP           MEDICATIONS:   Versed 4 mg, Epinephrine 50 mcg     TOPICAL ANESTHETIC:  Cetacaine Spray           DESCRIPTION OF PROCEDURE:   After the risks benefits and     alternatives of the procedure were thoroughly explained, informed     consent was obtained.  The EG-[id number] ([id number]) endoscope was     introduced through the mouth and advanced to the second portion of     the duodenum, without limitations.  The instrument was slowly     withdrawn as the mucosa was fully examined.     <<PROCEDUREIMAGES>>           Esophagitis was found in the distal esophagus (see image8).  A     stricture was found (see image9). mild fibrous stricture  A hiatal     hernia was found (see image7, image6, and image4). 3 cm hiatal     hernia  Otherwise the examination was normal (see image1, image2,     and image3). no active bleeding, no blood in the UGI tract     Retroflexed views revealed no abnormalities.    The scope was     then withdrawn from the patient and the procedure completed.           COMPLICATIONS:  None           ENDOSCOPIC IMPRESSION:     1) Esophagitis in the distal esophagus     2) Stricture     3) Hiatal hernia     4) Otherwise normal examination     bleeding was likely from g-e junction  RECOMMENDATIONS:     see chart for plan of treatment           REPEAT EXAM:  In 0 year(s) for.           ______________________________     Hedwig Morton. Juanda Chance, MD           CC:           n.     eSIGNED:   Hedwig Morton. Brodie at 06/20/2010 01:02 PM           Fernanda Drum, [phone]  Note: An exclamation mark (!) indicates a result that was not dispersed into the flowsheet. Document Creation Date: 06/20/2010 1:03 PM _______________________________________________________________________  (1) Order result status: Final Collection  or observation date-time: 06/20/2010 12:55 Requested date-time:  Receipt date-time:  Reported date-time:  Referring Physician:   Ordering Physician: Lina Sar [phone]) Specimen Source:  Source: Launa Grill Order Number: [id number] Lab site:

## 2010-07-03 NOTE — Discharge Summary (Signed)
  NAMEZARRAH, [name]                ACCOUNT NO.:  [id number]  MEDICAL RECORD NO.:  [id number]           PATIENT TYPE:  I  LOCATION:  1414                         FACILITY:  [facility]  PHYSICIAN:  Altha Harm, MDDATE OF BIRTH:  [birth year]  DATE OF ADMISSION:  06/19/2010 DATE OF DISCHARGE:  06/23/2010                              DISCHARGE SUMMARY   Addendum  DISCHARGE DISPOSITION:  Assisted living facility.  Author says, "Please refer to the discharge diagnosis dictated on the discharge summary on yesterday.  Please add to that, transient hypotension, resolved."  Chartered loss adjuster says, "In terms of the discharge medications, the adjustment to the discharge medication list is that the insulin sliding scale will be deleted and added to the discharge medications are metformin 250 mg p.o. b.i.d."  Thus, her discharge medications are ciprofloxacin, metformin, Protonix, carvedilol, Celexa, Citrucel, iron sulfate, lovastatin, meclizine, MiraLax, niacin, oxybutynin, oxycodone, acetaminophen, ProAir, trazodone, Tylenol, Vagifem, vitamin D at the doses and frequencies on the discharge summary from yesterday and the Glucophage from today.  The discontinued medications will include alendronate, diclofenac, famotidine, and warfarin.  HOSPITAL COURSE:  The patient had an episode of hypotension which was transient.  She received 1500 mL of fluid and she has been able to maintain her blood pressures since then without any difficulty. Otherwise, the patient has been stable.  On today, her physical examination is as follows:  Temperature 97.5, heart rate 82, blood pressure 120/69, respiratory rate 16.  O2 saturation is 98% on room air. CBGs 117.  HEENT examination, she is normocephalic, atraumatic.  Pupils are equally round and reactive to light and accommodation.  Extraocular movements are intact.  Oropharynx is moist.  No exit erythema or lesions are noted.  Neck examination, trachea is midline.  No  masses, no thyromegaly, no JVD, no carotid bruit.  Lungs are clear to auscultation. No wheezing or rhonchi noted.  Cardiovascular, she has got normal S1 and S2.  No murmurs, rubs or gallops are noted.  PMI is nondisplaced.  No heaves or thrills on palpation.  Abdomen is obese, soft, nontender, nondistended.  No masses, no hepatosplenomegaly.  Extremities showed no clubbing, cyanosis, or edema.  Psychiatric, the patient is alert and oriented x3.  Good insight and cognition.  She appears to have some difficulty with some recall.  Neurological, the patient has no focal neurological deficits.  Cranial nerves II-XII are grossly intact.  DIETARY RESTRICTIONS:  The patient should be on a diabetic, no added salt diet and her diet should be low residue.  PHYSICAL RESTRICTIONS:  Activity as tolerated with home health PT and OT.  DISPOSITION:  For transfer to the assisted living facility.     Altha Harm, MD     MAM/MEDQ  D:  06/23/2010  T:  06/23/2010  Job:  [id number]  Electronically Signed by Marthann Schiller MD on 07/03/2010 07:54:00 AM

## 2010-07-03 NOTE — Discharge Summary (Signed)
[name], [name]                ACCOUNT NO.:  [id number]  MEDICAL RECORD NO.:  [id number]           PATIENT TYPE:  I  LOCATION:  1414                         FACILITY:  [facility]  PHYSICIAN:  Altha Harm, MDDATE OF BIRTH:  Oct 04, 1931  DATE OF ADMISSION:  06/19/2010 DATE OF DISCHARGE:  06/22/2010                              DISCHARGE SUMMARY   DISCHARGE DISPOSITION:  Assisted living facility.  FINAL DISCHARGE DIAGNOSES: 1. Gastrointestinal bleeding likely secondary to esophagitis from an     EGD. 2. Anemia secondary to acute blood loss anemia. 3. Urinary tract infection. 4. Right upper lobe bronchial occlusion. 5. Small cell lung cancer. 6. Questionable warfarin coagulopathy. 7. Hypertension. 8. Diabetes type 2, well controlled. 9. Coronary artery disease. 10.Atrial fibrillation. 11.Vertigo. 12.Depression. 13.Gastroesophageal reflux disease. 14.Hyperlipidemia. 15.Arthritis.  DISCHARGE MEDICATIONS:  Include the following: 1. Ciprofloxacin 250 mg p.o. b.i.d. x3 days. 2. Insulin with sliding scale from 2 to 15 units depending upon blood     sugars.  See med rec discharge instructions. 3. Protonix 40 mg p.o. b.i.d. 4. Coreg 12.5 mg one-half tablet p.o. b.i.d. 5. Celexa 20 mg p.o. daily. 6. Citrucel. 7. Calcium citrate 2 tablets p.o. daily. 8. Iron sulfate 325 mg p.o. daily. 9. Lovastatin 20 mg p.o. q.h.s. 10.Meclizine 25 mg p.o. t.i.d. p.r.n. vertigo. 11.MiraLax 17 g in 8 ounces of fluid daily. 12.Niacin 500 mg p.o. q.h.s. 13.Oxybutynin 5 mg p.o. q.h.s. 14.Oxycodone and acetaminophen 5/325 mg 1 tablet p.o. b.i.d.     scheduled. 15.ProAir inhaler 1 puff inhaled 4 times a day for wheezing. 16.Trazodone 75 mg p.o. q.h.s. 17.Tylenol 325 mg 2 tablets p.o. q.4 h. p.r.n. pain. 18.Vagifem 10 mg 1 tablet inserted vaginally weekly. 19.Vitamin D3, 400 units 2 tablets p.o. daily.  DISCONTINUED MEDICATIONS:  Include the following: 1. Alendronate. 2. Diclofenac. 3.  Famotidine. 4. Metformin XR. 5. Warfarin.  CONSULTS:  Dr. Lina Macias, Gastroenterology.  PROCEDURES:  Esophagogastroduodenoscopy done on June 20, 2010, which showed esophagitis in the distal esophagus.  Impression: 1. Stricture. 2. Hiatal hernia. 3. Bleeding likely from GE junction.  DIAGNOSTIC STUDIES: 1. Two-view chest x-ray done on admission, which shows volume loss in     the right lung.  This is new opacification of the right lung apex.     This could represent collapse of the right upper lobe versus     progressive scarring.  Neoplasm is not excluded.  CT of the chest     with contrast, no use of further evaluation. 2. Stable calcified granuloma of left upper lobe. 3. New calcified superior mediastinal lymph nodes. 4. Study, CT of the chest without contrast, which shows atelectasis     and volume loss of remaining portions of the right upper lobe.     There is occlusion of the right upper lobe bronchus.  Suggest     further evaluation with PET CT to assess for residual recurrent     metabolic of the abscess tumor. 5. Patchy ground-glass density is an irregular nodule in the right     lower lobe was nonspecific.  Findings are likely secondary to  inflammation or infection.  Attention of these areas of PET CT     suggested.  CODE STATUS:  Do not resuscitate.  ALLERGIES:  No known drug allergies.  PRIMARY CARE PHYSICIAN:  Dr. Florentina Macias.  ONCOLOGIST:  Dr. Arlan Macias.  CARDIOLOGIST:  Dr. Sherryl Macias.  CHIEF COMPLAINT:  Shortness of breath over the last few days and melanotic stools for the last few weeks.  HISTORY OF PRESENT ILLNESS:  Please refer the H and P by Dr. Channing Macias; however in short this is a 77 year old female with a history of small cell lung cancer who presents to the emergency room with complaints of shortness of breath for 2 to 3 days.  The patient had no other signs of infection, brought herself with shortness of breath, was  getting progressively worse.  The patient also reported that she was having dark stools.  She denied any weakness, lightheadedness, or syncopal episodes.  HOSPITAL COURSE: 1. Upper GI bleeding.  The patient presented with complaints of     melanotic stools and with a hemoglobin of 7.0, which was     significantly down from her prior hemoglobin of 11.8 in April of     last year.  The patient was transfused 2 units of packed red blood     cells as she was symptomatic.  Gastroenterology was consulted and     performed an EGD on her.  On the EGD, she was found to have     gastritis and it was felt that the bleeding was occurring at the GE     junction.  The patient had been on diclofenac and alendronate both     of which are recommended to be discontinued at this time.  The     patient was also on Coumadin and recommendation from     Gastroenterology is that Coumadin be held for 2 weeks before     restarting.  When the patient came to the emergency room, she had     an INR, which was recorded as greater than 10.  However, in looking     office notes from her gastroenterologist, her INR had been within     normal ranges just 2 weeks prior to hospitalization.  After only 1     unit of FFP, the patient had an INR of 1.34.  I suspect that this     is probably an error and that the patient truly does not have INR     greater than 10 as I would not expect prolonged correction with 1     unit of FFP in INRs that is high.  Nevertheless, at this point, the     patient's INR is 1.22 and her Coumadin will be held.  Both the     transfusions, her recent hemoglobin is stable at 10.6 at the time     of discharge.  However, in fact the patient should stay away from     nonsteroidal anti-inflammatory drugs and Fosamax therapy for     osteoporosis may need to be considered in the form of calcium and     vitamin D for this patient. 2. Right upper lobe bronchial effusion.  The patient was noted to have     a  effusion of the right upper lobe bronchus.  Recommendations are     for PET CT scan and I will defer to Dr. Myna Macias, her oncologist     regarding this.  As it was unclear to me  as to where the patient is     in her course of therapy for her small cell lung cancer.     Nevertheless, the patient has not required oxygen and presently is     saturating well at 98% on room air and with respirations of 18.     There was some question as to whether or not the findings on the CT     scan may have also represented possible pneumonia.  The patient     felt that the picture was not consistent with pneumonia.     Nevertheless, the patient did receive Rocephin for her urinary     tract infection, which would also cover a community-acquired     pneumonia. 3. Urinary tract infection.  The patient presented with urinary tract     infection and was started on Rocephin.  She has received 4 days of     Rocephin therapy and I will continue her on 3 additional days of     ciprofloxacin for completion of 7 days of therapy.  Urine culture     was ordered.  However, the urine was never sent for culture and     thus, I have no culture beside her therapy.  I am repeating a     urinalysis right now to make sure that the urine has been cleared     of elements consistent with urinary tract infection. 4. Suspected warfarin coagulopathy.  As noted before, the patient upon     arrival to the emergency room, had an INR, which showed greater     than 10 and a prothrombin time of greater than 90.  There is some     question as to what this is accurate as this is a completely     reversed with just 1 unit of FFP and despite what the other fact,     has maintained an INR within normal ranges.  I would however,     recommend that the patient have an INR checked within the next 48     hours to ensure that it is staying within the normal range.  This     patient would be at great risk for re-bleeding if in the fact her     INR  is peaked up again. 5. Diabetes type 2.  The patient shows good control for diabetes with     her and hemoglobin A1c of 5.6, and the patient had been on     metformin; however, her eating pattern is very inconsistent and I     have spoken with her daughter who is also concerned about     hyperglycemia following in this patient.  Of course, at this time,     I have discontinued metformin and I recommended sliding scale     insulin for the patient until she demonstrates that she has been     consistent with eating pattern. 6. Atrial fibrillation.  The patient remained in good control while     she was here in the hospital on her Coreg.  Again, the warfarin was     withheld and Dr. Juanda Chance, Gastroenterology recommends that the     warfarin can be restarted in 2 weeks if the patient has no evidence     of bleeding or no drop in her hemoglobin.  Other than that the     patient is continued on her chronic medications for chronic medical     problems.  At the time of discharge, she is stable.  PHYSICAL EXAMINATION:  GENERAL:  She is well-appearing. VITAL SIGNS:  Temperature is 97.4, blood pressure 117/68, heart rate 80, respiratory rate 18, and O2 saturations are 98% on room air. HEENT:  She is normocephalic and atraumatic.  Pupils are equally round and reactive to light and accommodation.  Extraocular movements are intact.  Oropharynx is moist.  No exudate, erythema, or lesions are noted. NECK:  Trachea is midline.  No masses, no thyromegaly, no JVD, no carotid bruits. RESPIRATORY:  The patient has bronchial breath sounds in the right upper lobe.  Otherwise, she is clear to auscultation without any wheezing. CARDIOVASCULAR:  She has got a normal S1 and S2. ABDOMEN:  Obese, soft, nontender, and nondistended.  No masses, no hepatosplenomegaly. EXTREMITIES:  Show no clubbing, cyanosis, or edema. PSYCHIATRIC:  She is alert and oriented x3.  She appears to have some deficit in recent recall, but  remote recall is intact.  DIETARY RESTRICTIONS:  The patient should be on a diabetic, no added salt, low-residue diet.  PHYSICAL RESTRICTIONS:  Activity as tolerated.  FOLLOWUP:  The patient should follow up with Dr. Redmond School within 72 hours. She should follow up with Dr. Myna Macias within a week regarding the bronchial lobe mass and follow up with [facility] if she has any recurrent bleeding.    Total time to coordinate this discharge, 40 minutes.     Altha Harm, MD     MAM/MEDQ  D:  06/22/2010  T:  06/22/2010  Job:  [id number]  Electronically Signed by Marthann Schiller MD on 07/03/2010 07:53:03 AM

## 2010-07-04 ENCOUNTER — Encounter (HOSPITAL_BASED_OUTPATIENT_CLINIC_OR_DEPARTMENT_OTHER): Payer: Medicare Other | Admitting: Hematology & Oncology

## 2010-07-04 ENCOUNTER — Other Ambulatory Visit: Payer: Self-pay | Admitting: Hematology & Oncology

## 2010-07-04 DIAGNOSIS — N189 Chronic kidney disease, unspecified: Secondary | ICD-10-CM

## 2010-07-04 DIAGNOSIS — J8410 Pulmonary fibrosis, unspecified: Secondary | ICD-10-CM

## 2010-07-04 DIAGNOSIS — D649 Anemia, unspecified: Secondary | ICD-10-CM

## 2010-07-04 DIAGNOSIS — Z85118 Personal history of other malignant neoplasm of bronchus and lung: Secondary | ICD-10-CM

## 2010-07-04 DIAGNOSIS — R918 Other nonspecific abnormal finding of lung field: Secondary | ICD-10-CM

## 2010-07-04 LAB — CBC WITH DIFFERENTIAL (CANCER CENTER ONLY)
BASO%: 0.3 % (ref 0.0–2.0)
EOS%: 3.7 % (ref 0.0–7.0)
HCT: 32.1 % — ABNORMAL LOW (ref 34.8–46.6)
LYMPH%: 20.4 % (ref 14.0–48.0)
MCH: 29.7 pg (ref 26.0–34.0)
MCHC: 33.3 g/dL (ref 32.0–36.0)
MCV: 89 fL (ref 81–101)
NEUT%: 68.4 % (ref 39.6–80.0)
RDW: 13.1 % (ref 10.5–14.6)

## 2010-07-04 LAB — COMPREHENSIVE METABOLIC PANEL
Albumin: 4.1 g/dL (ref 3.5–5.2)
Alkaline Phosphatase: 47 U/L (ref 39–117)
BUN: 23 mg/dL (ref 6–23)
Glucose, Bld: 90 mg/dL (ref 70–99)
Total Bilirubin: 0.3 mg/dL (ref 0.3–1.2)

## 2010-07-04 LAB — VITAMIN D 25 HYDROXY (VIT D DEFICIENCY, FRACTURES): Vit D, 25-Hydroxy: 43 ng/mL (ref 30–89)

## 2010-07-24 ENCOUNTER — Encounter: Payer: Self-pay | Admitting: Internal Medicine

## 2010-07-24 ENCOUNTER — Encounter (INDEPENDENT_AMBULATORY_CARE_PROVIDER_SITE_OTHER): Payer: Medicare Other

## 2010-07-24 DIAGNOSIS — I4891 Unspecified atrial fibrillation: Secondary | ICD-10-CM

## 2010-07-29 LAB — SODIUM, URINE, RANDOM: Sodium, Ur: 115 mEq/L

## 2010-07-29 LAB — POCT CARDIAC MARKERS
CKMB, poc: <1 ng/mL — ABNORMAL LOW (ref 1.0–8.0)
Myoglobin, poc: 121 ng/mL (ref 12–200)
Troponin i, poc: <0.05 ng/mL (ref 0.00–0.09)

## 2010-07-29 LAB — CARDIAC PANEL(CRET KIN+CKTOT+MB+TROPI)
CK, MB: 0.8 ng/mL (ref 0.3–4.0)
Total CK: 31 U/L (ref 7–177)
Total CK: 48 U/L (ref 7–177)
Troponin I: 0.01 ng/mL (ref 0.00–0.06)

## 2010-07-29 LAB — URINALYSIS, ROUTINE W REFLEX MICROSCOPIC
Glucose, UA: NEGATIVE mg/dL
Nitrite: NEGATIVE
Protein, ur: 100 mg/dL — AB
pH: 5 (ref 5.0–8.0)

## 2010-07-29 LAB — DIFFERENTIAL
Basophils Absolute: 0 10*3/uL (ref 0.0–0.1)
Basophils Absolute: 0 10*3/uL (ref 0.0–0.1)
Basophils Relative: 0 % (ref 0–1)
Basophils Relative: 0 % (ref 0–1)
Eosinophils Absolute: 0.1 10*3/uL (ref 0.0–0.7)
Eosinophils Relative: 2 % (ref 0–5)
Lymphocytes Relative: 18 % (ref 12–46)
Lymphocytes Relative: 8 % — ABNORMAL LOW (ref 12–46)
Monocytes Absolute: 0.5 10*3/uL (ref 0.1–1.0)
Neutro Abs: 5.4 10*3/uL (ref 1.7–7.7)
Neutrophils Relative %: 84 % — ABNORMAL HIGH (ref 43–77)

## 2010-07-29 LAB — COMPREHENSIVE METABOLIC PANEL
ALT: 26 U/L (ref 0–35)
AST: 29 U/L (ref 0–37)
Albumin: 3.5 g/dL (ref 3.5–5.2)
Alkaline Phosphatase: 57 U/L (ref 39–117)
CO2: 23 mEq/L (ref 19–32)
Chloride: 101 mEq/L (ref 96–112)
GFR calc Af Amer: >60 mL/min (ref >60)
GFR calc non Af Amer: 57 mL/min — ABNORMAL LOW (ref >60)
Potassium: 3.8 mEq/L (ref 3.5–5.1)
Total Bilirubin: 0.7 mg/dL (ref 0.3–1.2)

## 2010-07-29 LAB — CBC
HCT: 33 % — ABNORMAL LOW (ref 36.0–46.0)
Hemoglobin: 11.3 g/dL — ABNORMAL LOW (ref 12.0–15.0)
MCHC: 33.2 g/dL (ref 30.0–36.0)
Platelets: 125 10*3/uL — ABNORMAL LOW (ref 150–400)
Platelets: 152 10*3/uL (ref 150–400)
Platelets: 171 10*3/uL (ref 150–400)
RBC: 3.97 MIL/uL (ref 3.87–5.11)
RDW: 14.8 % (ref 11.5–15.5)
RDW: 15.5 % (ref 11.5–15.5)
WBC: 5.5 10*3/uL (ref 4.0–10.5)
WBC: 6.2 10*3/uL (ref 4.0–10.5)

## 2010-07-29 LAB — CREATININE, URINE, RANDOM: Creatinine, Urine: 36 mg/dL

## 2010-07-29 LAB — URINE MICROSCOPIC-ADD ON

## 2010-07-29 LAB — BASIC METABOLIC PANEL
CO2: 22 mEq/L (ref 19–32)
Calcium: 9 mg/dL (ref 8.4–10.5)
Chloride: 108 mEq/L (ref 96–112)
GFR calc Af Amer: >60 mL/min (ref >60)
GFR calc non Af Amer: >60 mL/min (ref >60)
Glucose, Bld: 102 mg/dL — ABNORMAL HIGH (ref 70–99)
Glucose, Bld: 123 mg/dL — ABNORMAL HIGH (ref 70–99)
Potassium: 3.7 mEq/L (ref 3.5–5.1)
Potassium: 4.2 mEq/L (ref 3.5–5.1)
Sodium: 137 mEq/L (ref 135–145)
Sodium: 138 mEq/L (ref 135–145)

## 2010-07-29 LAB — GLUCOSE, CAPILLARY
Glucose-Capillary: 107 mg/dL — ABNORMAL HIGH (ref 70–99)
Glucose-Capillary: 110 mg/dL — ABNORMAL HIGH (ref 70–99)
Glucose-Capillary: 125 mg/dL — ABNORMAL HIGH (ref 70–99)
Glucose-Capillary: 128 mg/dL — ABNORMAL HIGH (ref 70–99)
Glucose-Capillary: 129 mg/dL — ABNORMAL HIGH (ref 70–99)
Glucose-Capillary: 131 mg/dL — ABNORMAL HIGH (ref 70–99)
Glucose-Capillary: 89 mg/dL (ref 70–99)

## 2010-07-29 LAB — URINE CULTURE

## 2010-07-29 LAB — POCT I-STAT, CHEM 8
Chloride: 105 mEq/L (ref 96–112)
Glucose, Bld: 151 mg/dL — ABNORMAL HIGH (ref 70–99)
HCT: 31 % — ABNORMAL LOW (ref 36.0–46.0)
Hemoglobin: 10.5 g/dL — ABNORMAL LOW (ref 12.0–15.0)
Potassium: 4.5 mEq/L (ref 3.5–5.1)
Sodium: 138 mEq/L (ref 135–145)

## 2010-07-29 LAB — PROTIME-INR
INR: 1.73 — ABNORMAL HIGH (ref 0.00–1.49)
INR: 2 — ABNORMAL HIGH (ref 0.00–1.49)
INR: 2.37 — ABNORMAL HIGH (ref 0.00–1.49)
Prothrombin Time: 25.7 seconds — ABNORMAL HIGH (ref 11.6–15.2)

## 2010-07-29 LAB — HEMOGLOBIN A1C: Mean Plasma Glucose: 108 mg/dL (ref <117)

## 2010-08-01 ENCOUNTER — Ambulatory Visit (HOSPITAL_BASED_OUTPATIENT_CLINIC_OR_DEPARTMENT_OTHER)
Admission: RE | Admit: 2010-08-01 | Discharge: 2010-08-01 | Disposition: A | Discharge status: Home / Self Care | Payer: Medicare Other | Source: Ambulatory Visit | Attending: Hematology & Oncology | Admitting: Hematology & Oncology

## 2010-08-01 DIAGNOSIS — R918 Other nonspecific abnormal finding of lung field: Secondary | ICD-10-CM

## 2010-08-01 DIAGNOSIS — R222 Localized swelling, mass and lump, trunk: Secondary | ICD-10-CM

## 2010-08-01 MED ORDER — IOHEXOL 350 MG/ML SOLN
100.0000 mL | Freq: Once | INTRAVENOUS | Status: AC | PRN
  Administered 2010-08-01: 100 mL via INTRAVENOUS

## 2010-08-10 ENCOUNTER — Encounter: Payer: Self-pay | Admitting: *Deleted

## 2010-09-01 ENCOUNTER — Other Ambulatory Visit: Payer: Self-pay | Admitting: Hematology & Oncology

## 2010-09-01 ENCOUNTER — Encounter (HOSPITAL_BASED_OUTPATIENT_CLINIC_OR_DEPARTMENT_OTHER): Payer: Medicare Other | Admitting: Hematology & Oncology

## 2010-09-01 DIAGNOSIS — Z85118 Personal history of other malignant neoplasm of bronchus and lung: Secondary | ICD-10-CM

## 2010-09-01 DIAGNOSIS — N189 Chronic kidney disease, unspecified: Secondary | ICD-10-CM

## 2010-09-01 DIAGNOSIS — D649 Anemia, unspecified: Secondary | ICD-10-CM

## 2010-09-01 LAB — CBC WITH DIFFERENTIAL (CANCER CENTER ONLY)
Eosinophils Absolute: 0.2 10*3/uL (ref 0.0–0.5)
MCV: 87 fL (ref 81–101)
MONO#: 0.6 10*3/uL (ref 0.1–0.9)
NEUT#: 4.6 10*3/uL (ref 1.5–6.5)
Platelets: 131 10*3/uL — ABNORMAL LOW (ref 145–400)
RBC: 4.08 10*6/uL (ref 3.70–5.32)
WBC: 6.8 10*3/uL (ref 3.9–10.0)

## 2010-09-02 LAB — VITAMIN D 25 HYDROXY (VIT D DEFICIENCY, FRACTURES): Vit D, 25-Hydroxy: 45 ng/mL (ref 30–89)

## 2010-09-02 LAB — COMPREHENSIVE METABOLIC PANEL
ALT: <8 U/L (ref 0–35)
AST: 14 U/L (ref 0–37)
Albumin: 4.4 g/dL (ref 3.5–5.2)
Alkaline Phosphatase: 54 U/L (ref 39–117)
BUN: 22 mg/dL (ref 6–23)
Potassium: 5.6 mEq/L — ABNORMAL HIGH (ref 3.5–5.3)

## 2010-09-02 LAB — T4: T4, Total: 9.6 ug/dL (ref 5.0–12.5)

## 2010-09-23 NOTE — Op Note (Signed)
NAMEONIA, SHIFLETT                ACCOUNT NO.:  [id number]   MEDICAL RECORD NO.:  [id number]          PATIENT TYPE:  INP   LOCATION:  3735                         FACILITY:  [facility]   PHYSICIAN:  [name], MD, FACCDATE OF BIRTH:  [birth year]   DATE OF PROCEDURE:  02/16/2007  DATE OF DISCHARGE:                                 OPERATIVE REPORT   SURGEON:  [name], MD, [facility]   PROCEDURE:  Following obtaining informed consent, the patient was brought to  the cardiac catheterization laboratory and placed on the fluoroscopic table  in the supine position.  After routine prep and drape of the right upper  chest, lidocaine was infiltrated in the prepectoral/subclavicular region.  An incision was made and carried down in the prepectoral fascia using  electrocautery and sharp dissection.  A pocket was formed similarly.  Hemostasis was obtained.   Thereafter, attention was turned to gaining access to the extrathoracic  right subclavian vein, which was accomplished with modest difficulty,  without aspiration of air or blood, but with puncture of the artery on 2  occasions.  Pressure was held for 2 minutes on each occasion.   Subsequently, an isolated venipuncture was accomplished.  A double wire and  a single sheath were undertaken, and then, sequentially, Medtronic 5076, 52  and 45-cm length, were placed.  The 45-cm leads were placed into the right  ventricular apex and the right atrial appendage respectively.  The bipolar  ventricular lead was a serial # [id number], and the atrial lead was a  Medtronic 5076, 45-cm length lead, serial #[id number].  Placement in the  right ventricular apex was accomplished with an R wave of 2.1 mV, with a  paced impedance of 1135 ohms, with a threshold immediately after screw  deployment of 2.1 V at 0.5 msec.  Current at threshold was 2.3 mA.  There  was no diaphragmatic pacing at 10 V.  The current of injury was very brisk.  The bipolar P  wave was 6.8 mV, with a paced impedance of 547 ohms and a  threshold of 1.8 V at 0.5 msec, and current at threshold was 4.0 mA.  Ultimately, the atrial lead, however, was not placed in  appendage because  we could not get capture; so, it was actually placed on the posterolateral  wall.  There was diaphragmatic pacing at 10 V noted.   The leads were secured to the prepectoral fascia.  The ventricular lead was  marked with a tie, and then the leads were attached to a Medtronic Adapta  ADDR01 pulse generator, serial #[id number].  P-synchronous pacing was  identified.  The pocket was copiously irrigated with antibiotic-containing  saline solution.  Hemostasis was assured, and the leads and the pulse  generator were placed in the pocket and secured to the prepectoral fascia.  The wound  was then closed in 3 layers in the normal fashion.  The wound was washed and  dried, and a benzoin and Steri-Strips dressing was applied.  Needle counts,  sponge counts and instrument counts were correct at the end of the procedure  according to the staff.  The patient tolerated the procedure without  apparent complication.           ______________________________  [name], MD, [facility]     SCK/MEDQ  D:  02/15/2006  T:  02/16/2006  Job:  [id number]   cc:   Ellin Saba., MD  Electrophysiology Laboratory  [facility]

## 2010-09-23 NOTE — Assessment & Plan Note (Signed)
[facility]                         ELECTROPHYSIOLOGY OFFICE NOTE   [name], [name]                       MRN:          [id number]  DATE:02/23/2007                            DOB:          May 11, 1932    [name] has atrial fibrillation, bradycardia and syncope.  She is  status post pacemaker implantation.  She has mild shortness of breath  but no chest pain.   MEDICATIONS:  1. Insulin.  2. Simvastatin.  3. Lexapro.  4. Aspirin.  5. Metoprolol 25 t.i.d.  6. Restoril.   PHYSICAL EXAMINATION:  VITAL SIGNS:  Her blood pressure is 102/60, pulse  is 78.  HEART:  Sounds are regular.  EXTREMITIES:  Without edema.   Interrogation of her Medtronic Pulse Generator demonstrates a P wave of  1 with impedance of 412, with threshold of 1 volt at 0.4.  The R wave is  11 with a paced impedance of 533 and a threshold of 0.75 at 0.4.  There  are multiple mode switch episodes with a rapid ventricular rate.  These  are relatively infrequent.   1. Paroxysmal atrial fibrillation.  2. Bradycardia and syncope.  3. Status post pacemaker for the above.  4. Recent episode of weakness.   Reviewing the electrogram information with the family, one wondered  whether her recent episode of weakness was not correlated with her  prolonged episode of atrial fibrillation which is associated with a  rapid ventricular response.   At this point, we will just continue to watch as these episodes here  today have been relatively infrequent.     [name], MD, [facility]  Electronically Signed    SCK/MedQ  DD: 02/23/2007  DT: 02/24/2007  Job #: [id number]

## 2010-09-23 NOTE — Consult Note (Signed)
[name], [name]                ACCOUNT NO.:  [id number]   MEDICAL RECORD NO.:  [id number]          PATIENT TYPE:  OUT   LOCATION:  XRAY                         FACILITY:  [facility]   PHYSICIAN:  Dr. Corliss Skains          DATE OF BIRTH:  Jul 29, 1931   DATE OF CONSULTATION:  DATE OF DISCHARGE:                                 CONSULTATION   DATE OF CONSULT:  January 10, 2008.   CHIEF COMPLAINT:  Back pain.   HISTORY OF PRESENT ILLNESS:  This is a pleasant 77 year old female who  was referred to Dr. Corliss Skains through the courtesy of Dr. Myna Hidalgo for  evaluation of back pain.  The patient has had pain for at least a year.  She had a bone scan in June that showed increased thoracic uptake.  The  patient is unable to have an MRI due to a permanent pacemaker.  She had  a CT scan performed just prior to the consult today.  She presents with  her daughter for further evaluation of the patient's back pain.   PAST MEDICAL HISTORY:  Significant for diabetes mellitus,  hyperlipidemia, coronary artery disease, and gastroesophageal reflux  disease.  She has a history of small cell lung CA of the right upper  lobe.  She had a bronchoscopy in October 2004 performed by Dr. Darrol Angel.  There was a concern that she did have bone mets treated with chemo and  radiation.  1. She underwent a cardiac catheterization in the past that showed a      90% diagonal lesion; however, this was not amendable to stenting      due to the size of the vessel.  She has chronic diastolic      congestive heart failure.  She has a history of atrial fibrillation      with tachybrady syndrome.  She is status post placement of      permanent pacemaker.  She has a history of arthritis, anemia, renal      insufficiency, a right pleural effusion, and depression.  She had      MRSA while she was on a ventilator for pneumonia in 2008.  She had      an echo in 2007 that showed ejection fraction of 67% with mild-to-      moderate MR.  She  probably has some mild dementia.   SURGICAL HISTORY:  The patient is status post cholecystectomy, right hip  surgery, and total abdominal hysterectomy.  She denies any previous  problems with anesthesia.   ALLERGIES:  No known drug allergies.   CURRENT MEDICATIONS:  Lantus insulin 4 units subcutaneously at bedtime,  MiraLax, Coumadin therapy 3 mg daily, simvastatin 40 mg daily, Lexapro  20 mg daily, multivitamin with iron daily, loratadine 10 mg daily,  Macrodantin 100 mg each evening, Reglan 5 mg b.i.d., omeprazole 20 mg  b.i.d., Coreg 12.5 mg b.i.d., Detrol LA 1 b.i.d., Voltaren 75 mg b.i.d.,  Ditropan 10 mg at bedtime, oxycodone p.r.n. for pain, Restoril p.r.n.  sleep, meclizine p.r.n. dizziness, and Xopenex handheld nebulizer  treatments.  SOCIAL HISTORY:  The patient is widowed.  She has 4 children.  She is  accompanied by 1 of her daughters today.  The patient lives at [facility] in [place].  She quit smoking 20 years  ago.  She has a 30-pack-year history of tobacco use.  She does not use  alcohol.  She is retired from [employer].   FAMILY HISTORY:  Her mother died at age 36 due to complications of  diabetes mellitus.  Her father died at an early age, the details are  unknown.   IMPRESSION AND PLAN:  As noted, the patient presents today for further  evaluation of back pain.  The patient states she has been having pain  for at least a year.  She is a poor historian.  Her daughter gives most  of the history.  Apparently, her pain did increase significantly within  the past couple of weeks.  There is no known injury.  The patient states  the pain is between her shoulder blades.  She feels that the pain is an  8 on a 1-10 scale.  Again, the patient is somewhat of a poor historian.   Dr. Corliss Skains reviewed the CT scan and the bone scan with the patient  and her daughter.  He pointed out fractures at T5 and T6 levels.  There  was some degree of  retropulsion.  The T6 level was extremely compressed.   The kyphoplasty or vertebroplasty procedures were described in detail.  The patient and her daughter were also shown a video describing the  procedure and given some written materials to study at home.  The risks  and benefits of the procedures were discussed.  Dr. Corliss Skains felt that  the patient would be a candidate for a kyphoplasty at the T5 level and  probably a vertebroplasty at the T6 level due to the severity of the  fracture.  The patient and her daughter wish to proceed as soon as  possible for stabilization of the fractures and relief of pain.  As  noted, the patient is on Coumadin.  She will have to be off the Coumadin  for at least 5 days prior to the intervention.  She had been told to  stop the Coumadin on Friday, January 13, 2008, at which time, she is to  start aspirin 325 mg daily while off the Coumadin.  Her procedure has  been scheduled for January 19, 2008.  Her insulin is to be held that  morning, and she had to be n.p.o. after midnight except for her  medications with small sips of water.   Greater than 40 minutes were spent on this consult.      Delton See, P.A.    ______________________________  Dr. Corliss Skains    DR/MEDQ  D:  01/11/2008  T:  01/11/2008  Job:  [id number]   cc:   Randalyn Rhea. Myna Hidalgo, M.D.

## 2010-09-23 NOTE — Consult Note (Signed)
NAMELIADAN, GUIZAR                ACCOUNT NO.:  [id number]   MEDICAL RECORD NO.:  [id number]          PATIENT TYPE:  OUT   LOCATION:  XRAY                         FACILITY:  [facility]   PHYSICIAN:  Sanjeev K. Deveshwar, M.D.DATE OF BIRTH:  January 05, 1932   DATE OF CONSULTATION:  02/06/2008  DATE OF DISCHARGE:                                 CONSULTATION   DATE OF CONSULT:  February 06, 2008.   BRIEF HISTORY:  This is a very pleasant 77 year old female referred to  Dr. Corliss Skains through the courtesy of Dr. Myna Hidalgo for evaluation of back  pain.  The patient has a permanent pacemaker and was unable to have an  MRI.  She did have a CT scan performed that did reveal severe fractures  at T5 and T6.  Arrangements were made for a kyphoplasty to be performed  on January 19, 2008.  The patient returns today accompanied by her  daughter to be seen in followup.   PAST MEDICAL HISTORY:  Significant for:  1. Diabetes mellitus.  2. Hyperlipidemia.  3. Coronary artery disease.  4. Gastroesophageal reflux disease.  5. 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  6. Bronchoscopy in October 2004 performed by Dr. Sung Amabile, possible      metastatic disease to the spine treated with chemo and radiation.  7. History of cardiac catheterization revealing a 90% diagonal lesion      that was not amenable to stenting due to the size of the vessel.      8.  The patient has chronic diastolic congestive heart failure,      history of atrial fibrillation, and tachybrady syndrome.  8. She is status post permanent pacemaker.  9. She has arthritis.  10.Anemia.  11.Renal insufficiency.  12.Right pleural effusion.  13.History of depression.  14.She had MRSA while she was on a ventilator for pneumonia in 2008.  15.She had an echo in 2007 which showed an ejection fraction of 67%      with mild-to-moderate MR.  16.There is question of mild dementia.   SURGICAL HISTORY:  The patient is status post cholecystectomy,  status  post right hip surgery, and status post total abdominal hysterectomy.  She denies any previous problems with anesthesia.   ALLERGIES:  No known drug allergies.   MEDICATIONS:  Insulin, MiraLax, Coumadin, simvastatin, Lexapro,  multivitamin with iron, loratadine, Macrodantin, Reglan, omeprazole,  Coreg, Detrol LA, Voltaren, Ditropan, oxycodone, Restoril, and Xopenex.   SOCIAL HISTORY:  The patient is widowed.  She has 4 children.  She was  accompanied by one of her daughters to both visits.  The patient lives  at [facility] in [place].  She quit smoking  20 years ago.  She has a 30-pack year history of tobacco use.  She does  not use alcohol.  She is retired from [employer].   FAMILY HISTORY:  Her mother died at age 62 due to complications of  diabetes.  Her father died at an early age, details are unknown.   IMPRESSION AND PLAN:  As noted, the patient returns today to be seen in  followup after undergoing a T5 kyphoplasty and biopsy on January 19, 2008.  The patient also had a severe fracture at the T6 level; however,  this could be treated due to the severity of the fracture.  The biopsy  came back negative for malignancy.   The patient reports that she was almost completely pain-free today  following the procedure.  She still has occasional mild discomfort  depending upon her activity level, but otherwise her pain has resolved.  She has a list of her medications from [facility].  It does not appear that she has taken any pain medication in  quite some time.  She is back on her Coumadin.  Overall, she feels she  is doing much better.   The patient's only complaint is that she has not been very active and  appears to be gaining weight.  We did encourage her to walk for  exercise.   Unfortunately, Dr. Corliss Skains was tied up in a procedure and was not able  to see the patient today.  This was explained to the patient  and her  daughter who expressed understanding.  They had no specific questions.  Further follow up will be on a p.r.n. basis.  Greater than 15 minutes  was spent on this followup visit.      Delton See, P.A.    ______________________________  Grandville Silos. Corliss Skains, M.D.    DR/MEDQ  D:  02/06/2008  T:  02/07/2008  Job:  [id number]   cc:   Randalyn Rhea. Myna Hidalgo, M.D.

## 2010-09-23 NOTE — Assessment & Plan Note (Signed)
[facility]                         ELECTROPHYSIOLOGY OFFICE NOTE   PERI, KREFT                       MRN:          [id number]  DATE:06/05/2008                            DOB:          February 25, 1932    Ms. Ahmed is seen in followup for pacemaker implanted for syncope  bradycardia in the setting of paroxysmal atrial fibrillation.  She has  no complaints of chest pain, shortness of breath, lightheadedness, or  edema.   She was cathed a few years ago demonstrating diffuse coronary artery  disease without specific obstructions and medical therapy was  recommended.   I also reviewed the chart back to when she was hospitalized in December  2008.  It was at this time that her longstanding Coumadin was  discontinued for related to weakness as well as having acute blood  loss.  It has not been resumed.   Her medications include no aspirin.  She takes Lantus, Lexapro 20,  ferrous sulfate, famotidine, Fosamax, simvastatin, ranitidine, Coreg  12.5 as well as Prilosec.   PHYSICAL EXAMINATION:  VITAL SIGNS:  Her blood pressure was 112/67 with  a pulse of 86, her weight was 156.  LUNGS:  Clear.  NECK:  Neck veins were flat.  HEART:  Her heart sounds were regular.  ABDOMEN:  Soft.  EXTREMITIES:  Had no edema.   Interrogation of Medtronic pulse generator demonstrates a P-wave of 4,  impedance of 366, a threshold of 0.75 at 0.4, the R-wave was 15.7,  impedance of 451, threshold 0.75 at 0.4.  Battery voltage was 2.78.  There was a couple of episodes of atrial fibrillation with a rapid  ventricular response.   IMPRESSION:  1. Paroxysmal atrial fibrillation with a rapid ventricular response.  2. Bradycardia and syncope.  3. Status post pacer for bradycardia and syncope.  4. Ischemic heart disease with normal left ventricular function and      medical therapy recommended.  5. Coumadin discontinued because of weakness and blood loss and      ongoing  iron support.   Ms. Cashatt is stable from arrhythmia point-of-view.  She also has no  complaints related to coronary artery disease.  I am concerned about the  lack of thromboembolic risk prophylaxis and  we will have to think about this at her next visit.  My inclination  would be to put her back on a low-dose aspirin.     [name], MD, [facility]  Electronically Signed    SCK/MedQ  DD: 06/05/2008  DT: 06/06/2008  Job #: [id number]   cc:   Ellin Saba., MD

## 2010-09-26 NOTE — Discharge Summary (Signed)
NAMEEDLA, PARA                ACCOUNT NO.:  [id number]   MEDICAL RECORD NO.:  [id number]          PATIENT TYPE:  INP   LOCATION:  3728                         FACILITY:  [facility]   PHYSICIAN:  Maple Mirza, PA   DATE OF BIRTH:  Sep 29, 1931   DATE OF ADMISSION:  03/22/2006  DATE OF DISCHARGE:  03/25/2006                                 DISCHARGE SUMMARY   SHE HAS NO KNOWN DRUG ALLERGIES.   PRINCIPAL DIAGNOSES:  1. Evidence of stitch abscess superficial skin layers at site of pacemaker      implantation.  2. Discharging day #2 status post incision and drainage of superficial      skin layers (minimal debridement, minimal purulence).  3. Pacemaker implanted February 15, 2006 for tachybrady syndrome.  4. The patient will discharge with daily wet-to-dry dressing changes at      the site of the incision and drainage and 10 days of oral Keflex.   SECONDARY DIAGNOSES:  1. Implantation of pacemaker February 15, 2006.  2. Status post right thoracentesis February 15, 2006 pleural effusion.  3. History of small-cell carcinoma status post x-ray therapy/chemotherapy.  4. Preserved left ventricular function.  5. Diabetes.  6. Peripheral neuropathy.  7. Gastroesophageal reflux disease.  8. Depression.  9. Dyslipidemia.  10.Chronic Coumadin therapy.   PROCEDURE:  March 23, 2006 incision and drainage of stitch abscess at  pacemaker implantation incision, Dr. Lewayne Bunting.  The pocket was not  opened.  Minimal amount of purulent material was noted.  Minimal amount of  necrotic tissue was debrided.   HOSPITAL COURSE:  The patient noted to have drainage from the pacemaker site  over the past 3-6 days.  She saw Dr. Myna Hidalgo the week prior to this  admission.  He told her to cover it with 2x2s and to follow up at [facility]  [facility] if healing had not occurred.  The patient was seen in the  Coumadin Clinic on November 12 and admitted by Dr. Excell Seltzer for Dr. Graciela Husbands for  treatment of possible  pacemaker site infection.   HOSPITAL COURSE:  The patient admitted directly from the office [facility] on November 12.  On November 13, the patient's incision was  inspected.  There was a minimal amount of purulence noted.  This was  cultured.  The culture results are not back yet.  It he was noted that there  was a tract formed under the skin but it was suspected that it did not  communicate with the pacer pocket itself although the pacer pocket was  somewhat tender the touch.  There was no diffuse erythema or swelling.  The  patient was taken on November 13 to the electrophysiology lab where incision  and drainage of this suspected stitch abscess was performed.  The patient  has then had dressing changes for wound care, wet-to-dry, which will  continue as outpatient with home health.  She will be discharged on the  following medications.  1. Coumadin 5 mg daily.  2. Keflex 500 mg taken four times daily for the next 10 days.  She is  to      take a 1/2 hour before meals and at bedtime.  3. Inderal LA 120 mg daily.  4. Ferrous sulfate 325 mg daily.  5. AcipHex 20 mg daily.  6. Multivitamin daily.  7. Vesicare as before this admission.  8. Zocor 40 mg daily.  9. Tricor 145 mg daily.  10.Glucophage 1000 mg b.i.d.  11.Lyrica 75 mg daily.  12.Provigil 100 mg daily.  13.Enablex 15 mg daily.  14.Lasix 20 mg daily.  15.Potassium chloride 20 mEq daily.  16.Lexapro 10 mg daily.  17.Diltiazem 120 mg daily.   The patient is discharging November 15.  She has follow-up at [facility], [address] on Monday November 19 to see Dr. Graciela Husbands for  incision check and wound check.  She will present at 10:30 in the morning.  At discharge, the patient's protime was 25, INR 2.1.  Serum electrolytes  this admission at discharge sodium 138, potassium 3.9, chloride 104,  carbonate 26, glucose 102, BUN 17, creatinine 0.9.  Complete blood count  this admission, white cells 5.2,  hemoglobin 11.2, hematocrit 33.1, platelets  217.      Maple Mirza, PA     GM/MEDQ  D:  03/25/2006  T:  03/25/2006  Job:  [id number]   cc:   [name], MD, [facility]  Ellin Saba., MD

## 2010-09-26 NOTE — Assessment & Plan Note (Signed)
[facility]                              CARDIOLOGY OFFICE NOTE   SANDIA, PFUND                       MRN:          [id number]  DATE:03/22/2006                            DOB:          1932/01/13    [name] is a pleasant 77 year old female patient of Dr. Sherryl Manges who  was seen in the anticoagulation clinic today for routine followup.  She  presents with chief complaint of an oozing wound at the site of her  pacemaker which was placed last month.  She saw Dr. Myna Hidalgo last week who  noted drainage at that time.  Dr. Myna Hidalgo provided the patient with several  2x2's to keep the wound covered and asked her to follow up with [place] this week  when in clinic.  She showed the wound to one of the anticoagulation clinic  nurses who sought followup for the patient at that time.  The patient states  that she cannot say exactly when this started.  She did notice that it was  red at some point last week.  She has noted drainage on her bra strap for a  day or so prior to seeing Dr. Myna Hidalgo last week.  The patient was seen and  examined in the anticoagulation clinic by physician today, Dr. Excell Seltzer for  Dr. Graciela Husbands today and is admitted today for treatment of possible pacemaker  site infection.   PAST MEDICAL HISTORY:  Pertinent for:  1. Paroxysmal atrial fibrillation/tachy-brady syndrome.      a.     The patient is status post placement of Medtronic Adapta       pacemaker February 15, 2006 by Dr. Sherryl Manges at [facility].      b.     The patient has been up titrated since this time on beta       blockers and calcium channel blockers to control her rate.  2. Coronary artery disease/hyperlipidemia.      a.     The patient underwent cardiac catheterization February 01, 2003       which revealed calcified vessels and 3-vessel disease with plan for       medical therapy.  3. Diabetes, maintained on oral agents, with subsequent peripheral  neuropathy.      a.     February 12, 2006 hemoglobin A1c is equal to 5.6%.  4. History of small cell lung cancer followed by Dr. Arlan Organ and the      [facility].      a.     Pleural effusion during October 2007 hospital admission with       subsequent thoracentesis.      b.     The patient is status post chemotherapy and radiation for       treatment of this, with an unknown therapy regimen.  5. Hypertension.  6. Gastroesophageal reflux disease.  7. History of depression and anxiety.   SOCIAL HISTORY:  The patient lives in [place] with her grandson.  She is  a retired widow.  She worked for 33 years prior  to retirement.  The patient  has 4 children, 9 grandchildren, 5 great-grandchildren many of whom live in  the greater [place] area.  She had a 2 pack/day smoking history for 40  years equaling 80/pack/year history, though she quit 25 years ago  approximately.  She denies alcohol use, illicit drug, herbal medications use  at this time.   FAMILY HISTORY:  The patient's mother passed away at age 56 from  complications of myocardial infarction and diabetes.  The patient's father  passed away when the patient was 77 years old.  The patient has 2 brothers  and 3 sisters, all of whom have passed away.   REVIEW OF SYSTEMS:  The patient denies fever and chills over the past week.  She states that she is always cold.  She notes the draining lesion on her  right chest wall and has had no significant change in her shortness of  breath since discharge from [facility] several weeks ago.  The patient does  continue to have periodic anxiety.  The patient notes that diarrhea has  resolved with recent Flagyl treatment for c. difficile positive stool  samples.  The patient states that she is intolerant to the cold weather and  does feel cold all the time.  All other systems are negative.   PHYSICAL EXAMINATION:  GENERAL:  The patient is a well-appearing, pleasant,   well-nourished, older female patient in no acute distress.  Vitals:  Temperature 97.4 oral, blood pressure 102/60, heart rate 72, respirations  are 16.  HEENT:  Pupils are equal, round and they react to light.  Fundi are not  visualized.  Oral and nasal mucosa are without lesion or demarcation.  Dentition is poor.  NECK:  Supple with 6 cm JVD noted at 45 degrees.  Wave form is within normal  limits.  No thyromegaly is appreciated.  Trachea is at midline.  CHEST:  Unremarkable, with lungs clear to auscultation bilaterally without  wheeze or rhonchi in all lung fields bilaterally.  CARDIOVASCULAR:  Irregularly regular heart rate, S1, S2 within normal  limits.  No S3 is appreciated.  No S4.  PMI is not evaluated.  ABDOMEN:  Soft, nontender, without rebound or guarding, no  hepatosplenomegaly.  GU and rectal exams are deferred.  EXTREMITIES:  No edema, no cyanosis, no clubbing.  NEURO:  The patient is alert and oriented to person, place and time, answers  questions appropriately, participates in visit.  Cranial nerves II-XII are  grossly intact.  Strength is equal and symmetric in all extremities.  SKIN:  The patient has no obvious rashes.  There is a 2x2 cm lesion on her  right chest wall at her pacemaker site that is notable for purulent drainage  which can be expressed upon application of direct pressure to the site.  It  is reddened and tender.   LABS:  Chest x-ray, EKG are pending admission to [facility]  Culture data from the past 30 days are reviewed.  The patient had positive  culture data during her previous hospitalization on January 09, 2006 with  Klebsiella pneumonia isolated out of urine.  On March 07, 2006 the patient  had c. difficile cultured out of stools, treated with Flagyl.   ASSESSMENT:  The patient has concerning drainage and symptoms associated  with possible pacemaker site infection.  Dr. Sherryl Manges has been consulted on telephone and agrees with admission and  possible further evaluation.   PLAN:  1. The patient will be admitted for intravenous  antibiotic therapy.  The      patient has no known drug allergies and thus will begin Cefazolin 1      gram IV q.8h around the clock.  The patient will be monitored for fever      and signs and symptoms of systemic infection.  2. The patient will be monitored to maintain appropriate drug therapy.  3. Further plans and followup will be per the direction of Dr. Sherryl Manges.      Shelby Dubin, PharmD, BCPS, CPP  Electronically Signed      Veverly Fells. Excell Seltzer, MD  Electronically Signed   MP/MedQ  DD: 03/22/2006  DT: 03/22/2006  Job #: [id number]

## 2010-09-26 NOTE — Consult Note (Signed)
[name], [name]                ACCOUNT NO.:  [id number]   MEDICAL RECORD NO.:  [id number]          PATIENT TYPE:  INP   LOCATION:  2902                         FACILITY:  [facility]   PHYSICIAN:  Charlcie Cradle. Delford Field, MD, FCCPDATE OF BIRTH:  1931-08-03   DATE OF CONSULTATION:  DATE OF DISCHARGE:                                 CONSULTATION   CHIEF COMPLAINT:  Respiratory failure.   HISTORY OF PRESENT ILLNESS:  This 77 year old white female admitted for  treatment of pneumonia.  She has had increasing shortness of breath,  cough, and breathing difficulty.  This culminated in a respiratory  distress and intubation in the early morning hours of July 08, 2006.  She is now on ventilator support.  Had been __________ , now her blood  pressure is improved.  She has a large right pleural effusion.  We are  asked to evaluate for the same.   PAST MEDICAL HISTORY:  Medical:  1. History of coronary artery disease.  2. Atrial fibrillation.  3. Tachybrady syndrome.  4. Permanent pacemaker placed October this past year.  5. History of small cell cancer of the lung.  6. Peripheral neuropathy.  7. Gastroesophageal reflux disease.  8. Depression.  9. Hyperlipidemia.  10.Had a right pleural effusion seen last fall that persisted.      Thoracentesis showed no malignancy.  11.Past history of type 2 diabetes.  12.Iron-deficiency anemia.  13.Heart catheterization in 2004, ejection fraction normal.  Three-      vessel coronary disease.  Medical therapy.   MEDICATIONS PRIOR TO ADMISSION:  Aciphex, Diclofenac, diltiazem, iron  sulfate, Inderal, Lexapro, Lyrica, metformin, Provigil, simvastatin,  Tricor, Vesicare.   SOCIAL HISTORY:  Lives in [place] with her grandson.  Has four  children and multiple grandchildren.  Widowed ex-smoker over 25 years  ago.   FAMILY HISTORY:  Positive for coronary disease and diabetes.   PHYSICAL EXAMINATION:  An ill-appearing female alert and oriented, in no  acute distress.  Will awaken but is sedated.  Blood pressure __________ , mean of 75, heart rate 72, sinus rhythm,  saturation 99%.  The patient is on tidal volume of 440, assist control  18, PEEP 8, 40% FiO2.  Urine output is adequate at this time.  Chest showed bilateral rhonchi with poor airflow.  Cardiac examination showed tachycardia without S3, normal S1, S2.  Abdomen was nontender.  Extremities showed no edema or clubbing or venous disease.  Skin was clear.  Neurologic, the patient is sedated on the ventilator.   LABORATORY DATA:  Sodium 133, potassium 3.7, __________ , BUN 37,  creatinine 1.5, calcium 7.8, white count is 17,000, hemoglobin 10.8,  platelet count 223, INR 3.1.  pH 7.40, pCO2 38, pO2 79.   Chest x-ray showed right pleural effusion, right-sided pneumonia.   IMPRESSION:  1. Acute respiratory failure with ventilator dependence.  2. Bilateral pulmonary infiltrates compatible with end-stage renal      disease.  3. Right pleural effusion.  4. Community-acquired pneumonia with hypercoagulable state.  5. Severe sepsis.  6. Septic shock.  7. Acute renal failure.   RECOMMENDATIONS:  Septic shock protocol.  Hold on Xigris because of the  elevated INR.  Tap right pleural effusion.  Cover with vancomycin,  Rocephin, Zithromax.  Transfer critical care service.  Will follow.      Charlcie Cradle Delford Field, MD, [facility]  Electronically Signed     PEW/MEDQ  D:  07/08/2006  T:  07/08/2006  Job:  [id number]   cc:   Gordy Savers, MD

## 2010-09-26 NOTE — Assessment & Plan Note (Signed)
[facility]                              CARDIOLOGY OFFICE NOTE   OTHELLO, SGROI                       MRN:          [id number]  DATE:03/03/2006                            DOB:          1932/04/30    The patient was seen yesterday with paroxysmal atrial fibrillation with  poorly controlled rate at 122.  She had some shortness of breath.  She had a  recent implantation of a ventricular pacer by Dr. Graciela Husbands, because of some  brady-tachy syndrome.  The note of March 03, 2006, was reviewed.  She had  not been on the diltiazem which had been ordered at discharge.  I suggest  she take an extra Inderal LA 120 last evening and then I gave her diltiazem  180 mg at that time and again this morning.  She is feeling somewhat better,  rate is down to 105.  She still has some shortness of breath.   MEDICATIONS:  Otherwise unchanged.   PHYSICAL EXAMINATION:  VITAL SIGNS:  Unchanged, except the decrease in heart  rate to 106, blood pressure was 112/78.  CARDIAC:  Unremarkable, except for the atrial fib.   I suggest the patient take another diltiazem 180 while in the office and we  will increase the diltiazem to 180 twice a day along with Inderal 120.   I will see her back on Monday to confirm her rate.   The EKG reveals atrial fib with ventricular rate of 106.    ______________________________  E. Graceann Congress, MD, [facility]    EJL/MedQ  DD: 03/04/2006  DT: 03/05/2006  Job #: [id number]

## 2010-09-26 NOTE — H&P (Signed)
[name], [name]                ACCOUNT NO.:  [id number]   MEDICAL RECORD NO.:  [id number]          PATIENT TYPE:  INP   LOCATION:  5030                         FACILITY:  [facility]   PHYSICIAN:  Barbette Hair. Artist Pais, DO      DATE OF BIRTH:  August 18, 1931   DATE OF ADMISSION:  01/09/2006  DATE OF DISCHARGE:                                HISTORY & PHYSICAL   PRIMARY CARE PHYSICIAN:  Tawny Asal, M.D.   CHIEF COMPLAINT:  Nausea, vomiting and mental status change.   HISTORY OF ILLNESS:  The patient is a 77 year old white female who was found  by her grandson on the floor.  The patient is reported to have nausea,  vomiting and lethargy.  The patient is  somewhat somnolent and is a poor  historian; and, most of the history if obtained from the patient's daughter.  The patient's daughter states heart rate mother has been complaining of left-  sided flank pain over the last several days.  There is no reported dysuria,  but increased urinary frequency.  The patient has had overall poor condition  over the last several months with complaints of fatigue and not feeling  herself.  There is a report of confusion; she  often refers to her deceased  husband who died in 03/09/05.  The patient has a history of unsteady  gait and staggering, which have somewhat worsened recently.   The patient lives with her grandson.  There is some concern as to whether  this is a safe Environment for the patient.  She often eats out and does  very little cooking.   PAST MEDICAL HISTORY:  1. Type 2 diabetes.  2. Hypertension.  3. Dyslipidemia.  4. History of lung cancer status post chemo and radiation, and followed by      Dr. Myna Hidalgo.  5. History of gallbladder surgery.   SOCIAL HISTORY:  The social history is as noted above.  She has a total of  four children.  She is retired.  She used to be a Holiday representative at [employer].  Remote history of tobacco; quite greater than 25 years ago.  No history of  alcohol  use.   FAMILY HISTORY:  The family history is positive for early coronary artery  disease as well as cancer.   CURRENT MEDICATIONS:  1. Metoprolol 25 mg once a day.  2. Diclofenac 75 mg twice daily.  3. Simvastatin 40 mg at bedtime.  4. Lexapro 10 mg at bedtime.  5. AcipHex 20 mg once a day.  6. Provigil 100 mg once a day.  7. Metformin a thousand milligrams twice daily.  8. Lyrica 75 mg twice daily.  9. Tricor 145 mg once daily.   ALLERGIES:  Allergies to medications are none known.   LABORATORY DATA:  CBC; WBC 7.5, H&H 10.7 and 31.0, platelets 178,000, and  MCV 85.  Comprehensive metabolic profile; sodium 139, potassium 4.2,  chloride 111, CO2 18, BUN 20, creatinine 1.2, and blood sugar 116.  Total  bili 1.1, alk phos 55, AST 85 and ALT 22.  Albumin 3.1.  UA positive for  blood, nitrites and leukocytes.  A CAT scan of the head was performed in the  ER with no acute findings.  CAT scan of her abdomen and pelvis showed  stranding of the left kidney; possible pyelonephritis and no evidence of  obstruction.   PHYSICAL EXAMINATION:  VITAL SIGNS:  Temperature 99.4, blood pressure  126/44, pulse 109, respirations 22, and O2 sat  92% on room air.  GENERAL APPEARANCE:  In general the patient is a 77 year old white female  who is somewhat somnolent, but arousable.  She is oriented to person and  place.  HEENT:  Normocephalic and atraumatic.  Pupils are equal and react to light  bilaterally.  Extraocular motility is intact.  The patient is anicteric.  Conjunctivae are somewhat pale.  The patient's oropharyngeal exam is  unremarkable, but mucous membranes are dry.  NECK:  The neck is supple.  No adenopathy.  CHEST AND LUNGS:  On chest exam she has normal respiratory effort.  The  chest is clear to auscultation bilaterally with no rales, rhonchi or  wheezing.  HEART:  Cardiovascular exam shows regular rate and rhythm.  No significant  murmurs, rubs or gallops appreciated.  Heart sounds  are somewhat distant.  ABDOMEN:  The abdomen is protuberant and soft.  There is left flank  tenderness, positive bowel sounds and no guarding.  EXTREMITIES:  The extremities show no clubbing, cyanosis or edema.  NEUROLOGIC:  Cranial nerves II-XII are grossly intact.  She is nonfocal.  The rest of the neuro exam is limited.   IMPRESSION AND RECOMMENDATIONS:  1. Mental status change; encephalopathy.  2. Urinary tract infection and possible pyelonephritis.  3. Type 2 diabetes.  4. History of lung cancer status post radiation and chemotherapy.  5. Normocytic anemia.  6. Hypertension.  7. Dyslipidemia.  8. History of peripheral neuropathy.  9. History of depression.   Plan/Recommendations:  We will admit her for IV fluids and IV antibiotics.  It is unclear whether  urine culture and blood culture were obtained the ER; we will empirically  start Cefopime IV.  We will also rule out pneumonia; check chest x-ray, cycle cardiac enzymes  and check serum ammonia level.   With regards to her anemia the patient likely has anemia of chronic disease;  however, we will check iron studies, B12 and folate.  We will consult PT regarding her at gait abnormality and will likely need  social work assistance in determining whether she is a candidate for home  health aide versus short-term rehab.      Barbette Hair. Artist Pais, DO  Electronically Signed     RDY/MEDQ  D:  01/09/2006  T:  01/09/2006  Job:  [id number]   cc:   Ellin Saba., MD

## 2010-09-26 NOTE — Assessment & Plan Note (Signed)
[facility]                           ELECTROPHYSIOLOGY OFFICE NOTE   MARGIA, WIESEN                       MRN:          [id number]  DATE:04/06/2006                            DOB:          1931-12-18    Ms. Soler comes in having had a I&D of a fairly deep stitch abscess.  It is  healing very nicely at this point.   We will plan to see her again in 2 weeks' time.     [name], MD, [facility]  Electronically Signed    SCK/MedQ  DD: 04/06/2006  DT: 04/06/2006  Job #: [id number]

## 2010-09-26 NOTE — Assessment & Plan Note (Signed)
[facility]                              CARDIOLOGY OFFICE NOTE   GAETANA, KAWAHARA                       MRN:          [id number]  DATE:01/14/2006                            DOB:          [birth year]    Here for routine cardiology followup.  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  She is followed by Dr. Scotty Court and Dr. Myna Hidalgo.  She has  coronary artery disease, as outlined.  Medical therapy has been recommended.  She has had no recurrent symptoms of coronary artery disease.  She had some  shortness of breath and her arms ache at times.  She was recently  hospitalized on __________ with mental status and encephalopathy secondary  to Klebsiella urinary tract infection and pyelonephritis.  She had pleural  effusion, question recurrent lung cancer, type 2 diabetes, iron-deficiency  anemia.  She was found to have 100,000 Klebsiella, treated with Cefepime and  discharged on Ceftin 250 b.i.d. until September 12th.  She is also on  metoprolol 25 daily, Lexapro 10, diclofenac 75 b.i.d., Provigil 100, Lyrica  75, Tricor 145, simvastatin 40 p.o. daily, Metformin 1 gm b.i.d., AcipHex  20, Vesicare, iron 325, Centrum Silver.   She is to follow up with Dr. Scotty Court.  She is also to see Dr. Myna Hidalgo.   PHYSICAL EXAMINATION:  VITAL SIGNS:  Blood pressure 110/64, pulse 97, normal  sinus rhythm.  GENERAL APPEARANCE:  She appears somewhat tired, pale, and ill, although not  acutely.  NECK:  JVP is not elevated.  Carotid pulses are palpated without bruits.  LUNGS:  Clear.  CARDIAC:  Normal.  ABDOMEN:  Unremarkable.  EXTREMITIES:  Normal.   EKG reveals a normal sinus rhythm, somewhat poor R wave progression in the  anterior leads, suggesting anterior infarct.  This is probably related to  lead placement.   We  plan to check lipid profile.  We will plan to see her back in 3-4 months  or p.r.n.                              E. Graceann Congress, MD, [facility]    EJL/MedQ  DD:  01/14/2006  DT:  01/14/2006  Job #:  [id number]

## 2010-09-26 NOTE — Assessment & Plan Note (Signed)
[facility]                                 ON-CALL NOTE   , CARMACK                       MRN:          [id number]  DATE:02/25/2007                            DOB:          [birth year]    I received a telephone call from a nurse at [facility]  regarding orders received from Dr. Graciela Husbands regarding Ms. Rooks  reinitiating her Coumadin therapy.  The prescription that was written by  Dr. Graciela Husbands was faxed back to the office.  [facility] Rosalita Levan was  requesting additional orders to clarify initial starting dose of  warfarin for Ms. Gao and plan for PT, INR followup.  This afternoon  at 2:30 I faxed and obtained confirmation that [facility]  received my orders to begin warfarin 2.5 mg p.o. daily today and obtain  weekly INRs with results to be faxed to [place] at [facility] at [phone].   TIME SPENT WITH PATIENT CARE AND WITH THE NURSING TEAM AT [facility]:  37 minutes.      Shelby Dubin, PharmD, BCPS, CPP  Electronically Signed      [name], MD, [facility]  Electronically Signed   MP/MedQ  DD: 02/25/2007  DT: 02/27/2007  Job #: [id number]   cc:   [name], MD, [facility]

## 2010-09-26 NOTE — Assessment & Plan Note (Signed)
[facility]                              CARDIOLOGY OFFICE NOTE   PORCHE, STEINBERGER                       MRN:          [id number]  DATE:03/03/2006                            DOB:          [birth year]    ADDENDUM   She was admitted in early September with encephalopathy thought to be  related to Klebsiella infection.    ______________________________  E. Graceann Congress, MD, [facility]    EJL/MedQ  DD: 03/03/2006  DT: 03/04/2006  Job #: [id number]

## 2010-09-26 NOTE — Op Note (Signed)
[name], [name]                ACCOUNT NO.:  [id number]   MEDICAL RECORD NO.:  [id number]          PATIENT TYPE:  INP   LOCATION:  1442                         FACILITY:  [facility]   PHYSICIAN:  Mark C. Ophelia Charter, M.D.    DATE OF BIRTH:  1931-06-14   DATE OF PROCEDURE:  09/06/2006  DATE OF DISCHARGE:                               OPERATIVE REPORT   PRE AND POSTOPERATIVE DIAGNOSIS:  Right intertrochanteric hip fracture.   PROCEDURE:  Right intramedullary hip screw.   SURGEON:  Annell Greening, M.D.   ANESTHESIA:  GOT.   ESTIMATED BLOOD LOSS:  200 mL.   BRIEF HISTORY:  77 year old female with type 2 diabetes, atrial fib,  lung cancer 2004 on chronic Lovenox with intertroch fracture.  She was  seen by the medical service.  I saw her in consultation and she is  brought in for intramedullary hip screw to decrease some of the intraop  blood loss with her atrial fibrillation and previous Lovenox treatment.   PROCEDURE:  After induction of general anesthesia the patient placed  fracture table, careful positioning, preoperative antibiotics given.  Standard prep and DuraPrep, positioning, traction, internal rotation.  The reduction checked under fluoro with excellent position AP and  lateral.  DuraPrep.  The area squared with towels, large Betadine shower  curtain drape was applied.  Incision was made proximal to trochanter  after time-out procedure was taken and confirmed.  Gluteus medius fascia  was split.  A pin was drilled center-center checked AP and lateral to  the trochanter and over reamed with the large reamer.  The large bone  ruler was placed over the top, adjusted so that it overlay the femur and  length of 380 was appropriate.  This left the tip of the nail top of the  patella.  Nail was passed down over position after being tightened down  securely with the guide attached to it laterally.  Once was advanced,  incision was made laterally in the vastus lateralis splitting the tensor  fascia and pin was drilled up to the lateral cortex of the femur, low in  the neck on AP and exactly center lateral and pin was parallel to the  floor.  Reaming with the 11 mm drill followed by placement after  measurement of 90 mm.  Helical blade was placed and then locked down  from the top.  Traction was removed from the leg.  Fracture was allowed  to compress.  After irrigation with saline solution.  The attachment to  the nail was removed, was irrigated, 0-0 Vicryl in the fascia, 2-0  subcutaneous tissue, skin staple closure postop dressing and transferred  cover stable condition.  Instrument and needle count was correct.      Mark C. Ophelia Charter, M.D.  Electronically Signed     MCY/MEDQ  D:  09/06/2006  T:  09/07/2006  Job:  [id number]

## 2010-09-26 NOTE — H&P (Signed)
[name], [name]                ACCOUNT NO.:  [id number]   MEDICAL RECORD NO.:  [id number]          PATIENT TYPE:  EMS   LOCATION:  ED                           FACILITY:  [facility]   PHYSICIAN:  Sean A. Everardo All, M.D. [facility] OF BIRTH:  29-Jan-1932   DATE OF ADMISSION:  12/05/2004  DATE OF DISCHARGE:                                HISTORY & PHYSICAL   REASON FOR ADMISSION:  Ataxia.   HISTORY OF PRESENT ILLNESS:  A 77 year old woman with several days of severe  ataxia in the context of taking Ativan.  She has had several associated  falls with no injury.  She also has several months of forgetfulness, and she  states that she recently got lost while she was driving to a place she  should have been familiar with, and a friend was needed to direct her home.   PAST MEDICAL HISTORY:  1.  Low back pain.  2.  Lung cancer.  3.  Type 2 diabetes.  4.  CAD.  5.  Dyslipidemia.  6.  Anxiety/depression.  7.  Painful neuropathy.  8.  She is not certain why she takes AcipHex.   MEDICATIONS:  1.  Vicodin 5/500 1 every 4 hours as needed for pain.  2.  Ativan 1 mg 3 times a day, as needed for anxiety (prescribed several      days ago).  3.  Glucophage 500 mg q.a.m. and 1000 mg q.p.m.  4.  Lipitor 20 mg daily.  5.  Lyrica 75 mg a day.  6.  AcipHex 20 mg a day.  7.  Lexapro 10 mg a day.   SOCIAL HISTORY:  Patient is retired.  Lives with her husband, who is in poor  health.   FAMILY HISTORY:  Her sister and daughter both have depression.   REVIEW OF SYSTEMS:  She has a chronic headache, but she denies the  following.  Loss of consciousness, chest pain, nausea, vomiting, visual  loss, abdominal pain, rectal bleeding, hematuria, dysuria, shortness of  breath, weight loss, and fever.   PHYSICAL EXAMINATION:  VITAL SIGNS:  Blood pressure 133/75, heart rate 92,  respiratory rate 18.  Patient is afebrile.  GENERAL:  No distress.  SKIN:  Not diaphoretic.  HEENT:  No evidence of head trauma to  external examination.  No scleral  icterus.  Pharynx:  No erythema.  NECK:  Supple.  CHEST:  Clear to auscultation.  CARDIOVASCULAR:  No JVD.  No edema.  Regular rate and rhythm.  No murmur.  Pedal pulses are intact.  ABDOMEN:  Soft, obese, nontender.  No hepatosplenomegaly.  No mass.  BREAST/GYNECOLOGIC/RECTAL:  Not done at this time due to patient condition.  EXTREMITIES:  Osteoarthritic changes are noted.  NEUROLOGIC:  Alert.  She is oriented x 3.  She readily moves all four.  Sensation is intact to touch except that it is decreased subjectively on the  lower extremities.   LABORATORY STUDIES:  Head CT is normal except for atherosclerosis.   CBC and BMET are normal.   IMPRESSION:  1.  Peripheral neuropathy of uncertain etiology.  2.  She appears to have early dementia.  3.  Ataxia, probably due to a combination of #1 and #2 as well as her      Ativan.  4.  Anxiety and depression.  5.  Other chronic medical problems as noted above.   PLAN:  1.  I discussed code status with the patient, and she requests DNR.  2.  Trial of ambulation tomorrow morning.  3.  If she is unable to ambulate, care management consult as well as      peripheral nerve conduction velocities and MRI of the brain would appear      to be the next steps.  4.  Hold Ativan.  5.  Continue other outpatient medications.       SAE/MEDQ  D:  12/05/2004  T:  12/05/2004  Job:  [id number]   cc:   Ellin Saba., M.D.  [address] [place]  [place] [address]  Fax: [phone]

## 2010-09-26 NOTE — Assessment & Plan Note (Signed)
[facility]                           ELECTROPHYSIOLOGY OFFICE NOTE   MORANDA, BILLIOT                       MRN:          [id number]  DATE:02/23/2006                            DOB:          Nov 21, 1931    PACEMAKER NOTE   Ms. Hove was seen today at the request of Shelby Dubin for an evaluation of  pacemaker.  This is a new implant done on February 15, 2006, for atrial  fibrillation and syncope.  It is a [employer]onic.  Model number is [id number]  Adapta.  On interrogation of her device today, her battery voltage was 2.78.  P-waves were greater than 5.6 with an atrial capture threshold 0.75 V at 0.4  msec and an atrial lead impedence of 472.  R-waves measured 11.2 to 15.68 mV  with a ventricular pacing threshold at 0.5 V at 0.4 msec and a ventricular  lead impedence of 570.  There were 1722 episodes noted totaling 50% of the  time since implant.  The patient had stated that she fell down the steps a  couple of days ago, did not pass out, near syncope, but did fall, and we  emphasized the fact that she needs to use her cane at least for the next few  months and let her device area get healed and hopefully give her a little  bit more stability.  I did remove her Steri-strips today.  There is a  resolving hematoma, but otherwise it looks fine.  She will be seen again in  January.      ______________________________  Altha Harm, LPN    ______________________________  [name], MD, [facility]    PO/MedQ  DD:  02/23/2006  DT:  02/24/2006  Job #:  [id number]

## 2010-09-26 NOTE — Consult Note (Signed)
NAMECOLUMBIA, PANDEY                ACCOUNT NO.:  [id number]   MEDICAL RECORD NO.:  [id number]          PATIENT TYPE:  INP   LOCATION:  3735                         FACILITY:  [facility]   PHYSICIAN:  [name], MD, FACCDATE OF BIRTH:  1932-04-22   DATE OF CONSULTATION:  DATE OF DISCHARGE:                                   CONSULTATION   CONSULTATION:  Thank you very much for asking me to see [name] in  consultation because of tachy/brady syndrome.   She is a 77 year old lady, who was found to be in atrial fibrillation on her  recent visit to Dr. Myna Hidalgo.  She was referred to hospital.  Atrial  fibrillation with recurrences has been noted, each of which has been  unassociated with palpitations.   However, she has had recurrent episodes over the last month or two, where  she has had abrupt onset of temporary loss of vision and presyncope.  These  episodes typically last just a few seconds.   On Holter monitoring, she has had numerous episodes of PAF with post  termination pauses of 5-8 seconds.   She also complains of progressive dyspnea on exertion over the last couple  of months.   Her thromboembolic risk factors are notable for:   1. Diabetes.  2. Hypertension.  3. Age.  4. Plus or minus gender.   Her related medical history is notable for lung cancer, for which she  received chemotherapy and radiation therapy.   Evaluation by echo currently has demonstrated moderate to large pericardial  effusion with question of hemodynamic significance.   Her past medical history is notable primarily as above.  I should add that  her lung cancer was small cell, that she had radiation in her midline and  that she has had 2 pleural effusions that have been tapped, negative for  malignancy and felt to be reactive to the aforementioned therapies.   Her past medical history in addition to the above is notable for:   1. Gastroesophageal reflux disease.  2. Nephrolithiasis.  3.  Incontinence of bladder and bowel.  4. Arthritis.   I failed to take a past surgical history.   Her review of systems, in addition to the above, is notable for:   1. Anemia.  2. Depression.   Her medications on admission included metoprolol 25, diclofenac, simvastatin  40, Lexapro, AcipHex 20, Provigil 100, Lyrica 75, metformin 1000 b.i.d.,  Tricor 145, and aspirin.   She has had no drug allergies.   SOCIAL HISTORY:  She is widowed.  She has 4 children.  She lives with her  grandson.  She does not use cigarettes, alcohol or recreational drugs.   On examination, she is an elderly Caucasian female in no acute distress.  Her blood pressure is 127/82, her pulse is 88 and irregular.  Her HEENT exam demonstrated no drift.  The neck veins were 7 cm.  The  carotids were brisk and full bilaterally without bruits.  The back was without kyphosis, scoliosis.  Lungs were clear.  Heart sounds were irregular without murmurs or gallops.  The  abdomen was soft with mild midline tenderness.  Femoral pulses were 2+.  She was wearing a Depends.  Her distal pulses were intact and there was no clubbing, cyanosis or edema.  Neurological exam was grossly normal.   Echocardiogram dated February 11, 2006, demonstrated paroxysms of atrial  arrhythmia, primarily PACs.  It was preceded a couple of hours before by  atrial fibrillation with a rapid ventricular response and rate about 120  with intervals of -/0.08/0.33.   Telemetry is notable as described above with frequent episodes of post  termination pauses with sinus rate in the low 20s and post termination  pauses of more than 5-6 seconds.   IMPRESSION:  1. Paroxysmal atrial fibrillation with a rapid ventricular response      without associated palpitations and followed by post termination pauses      of greater than 5-6 seconds.  2. Sinus node dysfunction with rates in the 15-20s following the above.  3. Presyncopal with number 1 and 2.  4.  Thromboembolic risk factors notable for:      a.     Hypertension.      b.     Diabetes.      c.     Age.      d.     Gender.  5. Pericardial effusion, question related to number 6.  6. History of lung cancer status post radiation therapy and chemotherapy      with prior history of pleural effusions that have been drained that      have been negative for malignancy.  In discussion with Dr. Myna Hidalgo this      morning, he feels that it is low risk that the pericardial effusion is      malignant.  7. Dyspnea on exertion, question related to the number 5.   DISCUSSIONS:  [name] has paroxysmal atrial fibrillation that is largely  symptomatic with significant post termination pauses, which are symptomatic.  She clearly needs backup brady pacing.  Treatment options for the  tachycardia depend upon bradycardia protection.   The next issue is potential causes for her pericardial effusion; this could  be reactive to the radiation therapy; they could be malignant as noted  previously Dr. Myna Hidalgo thinks the latter is less likely.  The next issue  related to pericardial effusion is whether it is potentially contributing to  her dyspnea.  I am going to have Dr. Eden Emms review the echo.  We need to  make a decision regarding whether the effusion should be capped.   I have discussed the above with the patient.  We will plan to proceed with  pacemaker implantation today.  I have reviewed with her the potential  benefits as well as the potential risks including, but not limited to death,  perforation, infection, lead dislodgement.  She understands these risks and  would like to proceed.   We will plan to have the echo reviewed to consider whether it needs to be  drained.   Thank you for the consultation.           ______________________________  [name], MD, [facility]     SCK/MEDQ  D:  02/15/2006  T:  02/15/2006  Job:  [id number]  cc:   [name], MD, [facility]

## 2010-09-26 NOTE — Discharge Summary (Signed)
   NAME:  [name], [name]                          ACCOUNT NO.:  [id number]   MEDICAL RECORD NO.:  [id number]                   PATIENT TYPE:  INP   LOCATION:  4707                                 FACILITY:  [facility]   PHYSICIAN:  [name], M.D.             DATE OF BIRTH:  04-25-1932   DATE OF ADMISSION:  01/31/2003  DATE OF DISCHARGE:  02/01/2003                                 DISCHARGE SUMMARY   ADDENDUM   As noted above, the patient had an abnormal chest x-ray on admission. A  follow-up CT scan was recommended.  I have scheduled her for an outpatient  CT at [facility] to be performed on September 27, Monday, at 7 a.m.  After speaking with the radiologist, it was recommended that this be  performed with contrast. We will keep the patient off her Glucophage until  after her CT scan.  This has been discussed with both the patient and her  daughter and they express understanding.      Delton See, P.A. [facility]                  E. Graceann Congress, M.D.    DR/MEDQ  D:  02/01/2003  T:  02/01/2003  Job:  [id number]

## 2010-09-26 NOTE — H&P (Signed)
[name], [name]                ACCOUNT NO.:  [id number]   MEDICAL RECORD NO.:  [id number]          PATIENT TYPE:  INP   LOCATION:  1442                         FACILITY:  [facility]   PHYSICIAN:  Della Goo, M.D. DATE OF BIRTH:  18-Nov-1931   DATE OF ADMISSION:  09/05/2006  DATE OF DISCHARGE:                              HISTORY & PHYSICAL   CHIEF COMPLAINT:  Right hip pain.   HISTORY OF PRESENT ILLNESS:  This is a 77 year old female who presented  to the emergency department secondary to complaints of severe right hip  pain following a fall.  She reports just losing her balance and falling.  She denies being dizzy or having any shortness of breath or chest pain,  loss of consciousness, syncope or seizures associated with this episode.  She was evaluated in the emergency department and found to have a  fracture of the right hip.  Orthopedics evaluated the patient, but the  patient had already had her daily Lovenox injection and had also eaten,  so plans are being made for surgery in the a.m.   PAST MEDICAL HISTORY:  1. History of atrial fibrillation.  2. Coronary artery disease.  3. Diabetes mellitus, type 2.  4. Small cell lung cancer diagnosed in 2004.  5. Pacemaker.  6. Hypertension.   SURGICAL HISTORY:  1. Total abdominal hysterectomy.  2. Cholecystectomy.   MEDICATIONS:  1. Coreg 6.25 mg one p.o. daily.  2. Lovenox 40 mg one subcu daily.  3. Pepcid 20 mg one p.o. b.i.d.  4. Lopressor 25 mg one p.o. q.8 h.  5. Ferrous sulfate 325 mg one p.o. b.i.d.  6. albuterol nebulizer treatments q.6 h.  7. Lexapro 10 mg one p.o. daily.  8. Lantus insulin 10 units subcu q.a.m., 5 units subcu q.p.m.  9. Simvastatin 40 mg one p.o. daily.  10.Sliding scale insulin coverage p.r.n.  11.Vesicare 10 mg one p.o. daily.  12.Cardizem CD 180 mg one p.o. daily.   ALLERGIES:  NO KNOWN DRUG ALLERGIES.   SOCIAL HISTORY:  Former smoker, lives alone, nondrinker.   FAMILY HISTORY:   Noncontributory.   REVIEW OF SYSTEMS:  Pertinents are mentioned above.   PHYSICAL EXAMINATION:  GENERAL:  A frail 77 year old in discomfort, but  no acute distress.  VITAL SIGNS:  Temperature 97.8, blood pressure 135/65, heart rate 87,  respirations 18, O2 saturations 96%.  HEENT:  Normocephalic, atraumatic.  Pupils are equally round and  reactive to light.  Extraocular muscles are intact.  Funduscopic benign.  Oropharynx is clear.  NECK:  Supple.  Full range of motion.  No thyromegaly, adenopathy or  jugulovenous distention.  CARDIOVASCULAR:  Regular rate and rhythm.  LUNGS:  Clear to auscultation bilaterally.  ABDOMEN:  Positive bowel sounds.  Soft, nontender and nondistended.  EXTREMITIES:  Without edema.  NEUROLOGIC:  Examination nonfocal.   LABORATORY STUDIES:  Sodium 135, potassium 3.7, chloride 100, CO2 23,  BUN 9, creatinine 0.7, glucose 197.  White blood cell count 10.0,  hemoglobin 10.1, hematocrit 29.7, platelets 210,000.  Pro time 14.5, INR  of 1.1 and PTT 32.   RADIOLOGIC FINDINGS:  Chest x-ray reveals a stable loculated right  pleural effusion, stable calcified left upper lobe nodule, which is  likely a granuloma, and chronic interstitial prominence in the lungs.  The patient does have cardiomegaly and a dual-lead pacemaker present.   ASSESSMENT:  [age]-year-old female with:  1. Right hip fracture status post fall.  2. Normocytic anemia.  3. Hyperglycemia with type 2 diabetes mellitus.  4. Hypertension.  5. History of small cell lung cancer.   PLAN:  The patient has been admitted and will be placed on pain control  therapy.  She will be made n.p.o. overnight for her surgery in the a.m.  She will continue on her regular medications up until she is made n.p.o.  She has been placed on sliding scale coverage for elevated blood sugars.  Her Lantus dosage has been decreased by half.  The patient has been  placed on GI prophylaxis and SCDs have been ordered for  DVT prophylaxis.      Della Goo, M.D.  Electronically Signed     HJ/MEDQ  D:  09/05/2006  T:  09/06/2006  Job:  [id number]   cc:   Lenon Curt Chilton Si, M.D.  Fax: [phone]

## 2010-09-26 NOTE — Discharge Summary (Signed)
[name], [name]                ACCOUNT NO.:  [id number]   MEDICAL RECORD NO.:  [id number]           PATIENT TYPE:   LOCATION:                                 FACILITY:   PHYSICIAN:  Barbara C. Wall, MD, FACCDATE OF BIRTH:  1931-11-03   DATE OF ADMISSION:  03/05/2006  DATE OF DISCHARGE:  03/08/2006                               DISCHARGE SUMMARY   PRIMARY CARDIOLOGIST:  Barbara Cranker, MD, [facility].   PRIMARY CARE [name]:  Barbara Asal, MD   ELECTROPHYSIOLOGIST:  Barbara Salvia, MD, [facility].   ONCOLOGIST:  [name], M.D.   DISCHARGE DIAGNOSIS:  Atrial fibrillation with rapid ventricular  response.   SECONDARY DIAGNOSES:  1. Acute on chronic diastolic congestive heart failure.  2. Chronic kidney disease.  3. Tachy-brady syndrome.  4. Hyperlipidemia.  5. Non-insulin-dependent diabetes mellitus.  6. Hypertension.  7. Gastroesophageal reflux disease.  8. Small cell lung cancer in the past with radiation and chemo.  9. Iron deficiency anemia.  10.Coronary artery disease.  11.Arthritis.  12.Depression.  13.Remote tobacco abuse.   ALLERGIES:  No known drug allergies.   PROCEDURES:  2D echocardiogram on March 08, 2006, showing EF of 65% ,  small pericardial effusion.  Mild aortic insufficiency.  Mild-to-  moderate mitral regurgitation.   HISTORY OF PRESENT ILLNESS:  A 77 year old Caucasian female with the  above problem list who was in her usual state of health until March 05, 2006, when she noted increasing dyspnea along with orthopnea and  PND.  The patient's daughter also noted elevated heart rate.  In the  emergency room, the patient was found to be in atrial fibrillation with  rapid ventricular response, and she was admitted for further evaluation.   HOSPITAL COURSE:  The patient ruled out for an MI.  Apparently, her  diltiazem prescription that she had previously been placed on was never  filled, and this was resumed.  She was initially treated  with IV  Lopressor for rate control, and she subsequently converted to sinus  rhythm.  Her INR was subtherapeutic, and she was maintained on heparin  while her INR came up.  It was felt that some of her dyspnea was likely  secondary to acute diastolic heart failure, and she was placed on  intravenous Lasix.  She had significant improvement in dyspnea by  March 07, 2006, and her Lasix dose was reduced.  An echocardiogram on  March 08, 2006, showed normal LV function as outlined above.  She was  discharged home in good condition.   DISCHARGE LABS:  Hemoglobin 10.2, hematocrit 30.3, WBC 4.6, platelets  184.  INR 2.5, sodium 139, potassium 4.3, chloride 105, CO2 26, BUN 41,  creatinine 1.7, glucose 97, and calcium 9.1.  Cardiac markers negative  x4.  BNP on admission was 623.  UA was negative.   DISPOSITION:  The patient was discharged home on March 08, 2006.   FOLLOWUP PLAN AND APPOINTMENTS:  She was to follow up with Dr. Corinda Macias  on March 16, 2006, at 10:45 a.m.  She was also asked to follow  up with  her primary care [name], Dr. Scotty Macias, in 1-2 weeks.  She had an INR  scheduled for March 11, 2006, at 3:15 p.m. with our Cardiology  [facility].   DISCHARGE MEDICATIONS:  1. Coumadin as directed.  2. Inderal LA 120 mg daily.  3. Lexapro 10 mg daily.  4. Iron 325 mg b.i.d.  5. Provigil 100 mg daily.  6. Lyrica 75 mg nightly.  7. TriCor 145 mg daily.  8. Simvastatin 40 mg nightly.  9. Metformin 1000 mg b.i.d.  10.Aciphex 20 mg daily.  11.Multivitamin 1 daily.  12.Vesicare 10 mg daily.  13.Diltiazem CD 120 mg daily.  14.Lasix 20 mg daily.   OUTSTANDING LABORATORY STUDIES:  None.   DURATION OF DISCHARGE/ENCOUNTER:  40 minutes including physician time.      [name], [name]      Barbara Sans. Daleen Squibb, MD, [facility]  Electronically Signed    CB/MEDQ  D:  07/28/2007  T:  07/29/2007  Job:  [id number]

## 2010-09-26 NOTE — Discharge Summary (Signed)
NAME:  [name], [name]                ACCOUNT NO.:  [id number]   MEDICAL RECORD NO.:  [id number]          PATIENT TYPE:  INP   LOCATION:  3735                         FACILITY:  [facility]   PHYSICIAN:  Everardo Beals. Juanda Chance, MD, FACCDATE OF BIRTH:  16-Feb-1932   DATE OF ADMISSION:  02/11/2006  DATE OF DISCHARGE:  02/19/2006                                 DISCHARGE SUMMARY   PRIMARY CARE PHYSICIAN:  Tawny Asal, MD   PRIMARY ONCOLOGIST:  Rose Phi. Myna Hidalgo, M.D.   PRIMARY CARDIOLOGIST:  [name], MD, [facility]   ELECTROPHYSIOLOGIST:  [name], MD, [facility]   PRINCIPAL DIAGNOSIS:  Tachybrady syndrome.   SECONDARY DIAGNOSES:  1. Atrial fibrillation.  2. Moderate pericardial effusion without tamponade physiology.  3. Bilateral pleural effusions status post right-sided thoracentesis with      1.3 liters drawn off.  4. History of small-cell lung cancer status post radiation and      chemotherapy, followed by Dr. Myna Hidalgo.  5. Presyncope.  6. Iron-deficiency anemia.  7. Hypertension.  8. Hyperlipidemia.  9. Type 2 diabetes mellitus.  10.Hypertriglyceridemia.  11.Dysphasia.  12.Coronary artery disease.   ALLERGIES:  No known drug allergies.   PROCEDURES:  1. Right-sided thoracentesis.  2. 2-D echocardiogram.  3. Dual-chamber permanent pacemaker placement.  4. CT of the head and chest.   HISTORY OF PRESENT ILLNESS:  A 77 year old white female with prior history  of coronary artery disease as well as lung cancer status post chemotherapy  and radiation who was admitted February 11, 2006, following new diagnosis of  atrial fibrillation with rapid ventricular response discovered by Dr.  Myna Hidalgo in his outpatient clinic.   HOSPITAL COURSE:  Following admission, her beta-blocker was titrated, and  she was placed on Lovenox anticoagulation.  Oral diltiazem was also added  for improved rate control, and she subsequently was noted to experience  significant pauses up to greater than  5 seconds.  As a result,  electrophysiology was consulted, and she underwent successful placement of a  [employer]onic Adapta Addi 01, Serial Number [id number] H dual-chamber permanent  pacemaker by Dr. Graciela Husbands on October 8.  She tolerated this procedure well, and  postprocedure experienced no pneumothorax.  With regards to atrial  fibrillation, she was then initiated on Coumadin therapy, and her calcium  channel blocker and beta blocker had been consolidated.   Chest x-ray on admission revealed improvement in the right effusion compared  to CT on September 25 with stable right lower lobe atelectasis.  On exam,  she was noted to have dullness to percussion and decreased breath sounds at  the right base and subsequently underwent an ultrasound-guided right-sided  thoracentesis with removal of 1.3 liters of clear yellow fluid.  Cytology  was negative for malignancy.  CT of the chest also was suggestive of not  only pleural effusions, but pericardial effusion initially felt to the  moderate to large and circumferential to the heart on 2-D echocardiogram on  October 5.  Dr. Myna Hidalgo was involved throughout the patient's  hospitalization, and it was not clear if there was tamponade physiology  related  to the effusion.  The team had considered right heart cardiac  catheterization.  However, repeat 2-D echocardiogram on October 10 showed a  moderate pericardial effusion, primarily posterior to the heart, with a  small anterior component without evidence of tamponade, and decision was  made not to pursue right heart catheterization given anticoagulation with  Coumadin and recent permanent pacemaker placement with leads in the right  atrium and right ventricle.  It was felt unlikely that her effusions were  secondary to recurrent malignancy, and thus pericardial window was not  considered necessary at this point as the patient is asymptomatic.   Finally, Ms. Tuckey had complained of some dysphasia.  It was  noted  incidentally on her chest CT that there was some wall thickening of the  proximal to mid esophagus which may be due to esophagitis as well as  stricture of the esophagus at the carinal level with increased soft tissue  in the subcarinal space.  As recommended by radiology, the patient will  undergo barium esophagram, and this can be arranged at a later date as an  outpatient.   DISCHARGE LABORATORY DATA:  Hemoglobin 10.9, hematocrit 32.1, WBC 3.7.  Chloride 208. PT 19.4, INR 1.6.  Sodium 135, potassium 4.2, chloride 106,  CO2 19, BUN 16, creatinine 1.1, glucose 94, calcium 8.7. Hemoglobin A1c 5.6.  BNP less than 30.  TSH 4.579.   DISPOSITION:  The patient is being discharged home today in good condition.   FOLLOWUP APPOINTMENTS:  She has follow-up at [facility] on Tuesday, October 16, at 12:15 p.m.Marland Kitchen  She has followup at [facility] on October 24 at 9:00 a.m.Marland Kitchen  She will see Dr. Glennon Hamilton on November 14 at 3:00 p.m..  And finally, Dr. Graciela Husbands on June 22, 2006, at 9:40 a.m..  It is recommend she follow up Dr. Myna Hidalgo and Dr.  Scotty Court as scheduled and likely will require outpatient GI evaluation.   DISCHARGE MEDICATIONS:  1. Inderal LA 120 mg daily.  2. Diltia-XT 120 mg daily.  3. Coumadin 5 mg nightly.  4. Lexapro 10 mg daily.  5. Diclofenac 75 mg b.i.d.  6. Provigil 100 mg daily.  7. Lyrica 75 mg daily.  8. TriCor 145 mg daily.  9. Simvastatin 40 mg daily.  10.Metformin 1000 mg b.i.d.  11.AcipHex 20 mg daily.  12.Vesicare 10 mg daily.  13.Ferrous sulfate 325 mg b.i.d.  14.Centrum Silver 1 daily.   OUTSTANDING LABORATORY STUDIES:  None.   DURATION OF DISCHARGE ENCOUNTER:  60 minutes including physician time.     ______________________________  Nicolasa Ducking, ANP    ______________________________  Everardo Beals. Juanda Chance, MD, [facility]    CB/MEDQ  D:  02/19/2006  T:  02/21/2006  Job:  [id number]  cc:   Rose Phi. Myna Hidalgo,  M.D.  Ellin Saba., MD

## 2010-09-26 NOTE — Cardiovascular Report (Signed)
NAME:  [name], [name]                          ACCOUNT NO.:  [id number]   MEDICAL RECORD NO.:  [id number]                   PATIENT TYPE:  INP   LOCATION:  4707                                 FACILITY:  [facility]   PHYSICIAN:  Salvadore Farber, M.D.             DATE OF BIRTH:  04/08/1932   DATE OF PROCEDURE:  02/01/2003  DATE OF DISCHARGE:                              CARDIAC CATHETERIZATION   PROCEDURE:  Left heart catheterization, left ventriculography, coronary  angiography.   INDICATIONS:  Ms. Bezek is a 77 year old lady with seven years of diabetes  mellitus, prior tobacco abuse, and family history of premature  atherosclerotic disease.  She presents with two months of chest discomfort  occurring with relatively little exertion.  She has had no rest discomfort.  She is referred for diagnostic angiography.   PROCEDURAL TECHNIQUE:  Informed consent was obtained.  Under 1% lidocaine  local anesthesia a 6-French sheath was placed in the right femoral artery  using the modified Seldinger technique.  Diagnostic angiography and  ventriculography were performed using JL4, JR4, and pigtail catheters.  To  better assess the size of the LAD, intracoronary nitroglycerin was  administered (200 mcg).  The patient tolerated the procedure well and was  transferred to the holding room in stable condition.  Sheaths are to be  removed there.   COMPLICATIONS:  None.   FINDINGS:  1. LV 119/5/17.  EF 80% without regional wall motion abnormality.  2. No aortic stenosis.  1+ mitral regurgitation.  3. Left main:  There is an ostial 40% stenosis.  4. LAD:  Diffusely diseased vessel which is markedly narrowed throughout its     course.  The majority of the vessel was approximately 2.0 mm.  In the     proximal and mid vessel it is very heavily calcified.  The distal LAD has     a relatively focal 70% stenosis superimposed upon the severe diffuse     disease.  The LAD gives rise to a single large  diagonal branch.  While     lengthy, this diagonal is only 2.0 mm in diameter.  It has an ostial 90%     stenosis.  There is a second 90% stenosis in its distal third.  5. Circumflex:  Relatively large vessel giving rise to three obtuse     marginals.  There is a 40% stenosis of the AV groove circumflex after the     takeoff of the first marginal.  The first marginal has a 30% stenosis     proximally.  6. RCA:  Moderate sized dominant vessel.  There is a 50% stenosis     proximally.  There is a 60% stenosis before the takeoff of the PDA.  The     PDA has a 95% stenosis in its most distal section.   IMPRESSION/RECOMMENDATIONS:  The culprit lesion for her angina appears to be  the  serial diagonal lesions.  Due to heavy calcium, small size of the  vessel, disease in the left anterior descending at the site of the takeoff  of the  diagonal, and the downstream disease in this diagonal, the ostial lesion is  not readily amenable to percutaneous intervention.  I therefore favor a  trial of medical therapy.  Should medical therapy fail to satisfactorily  relieve her angina, would consider high risk PCI versus coronary artery  bypass graft for symptom relief only.                                               Salvadore Farber, M.D.    WED/MEDQ  D:  02/01/2003  T:  02/01/2003  Job:  [id number]   cc:   Ellin Saba., M.D.  [address] [place]  [place] [address]  Fax: [phone]

## 2010-09-26 NOTE — Assessment & Plan Note (Signed)
[facility]                              CARDIOLOGY OFFICE NOTE   CAMIYA, VINAL                       MRN:          [id number]  DATE:03/03/2006                            DOB:          [birth year]    The patient a very pleasant 77 year old white female with coronary artery  disease, hyperlipidemia, diabetes, small-cell lung cancer with pleural  effusion, peripheral neuropathy, GERD, depression and recent paroxysmal  atrial fibrillation.   When she was admitted with atrial fibrillation and rapid rate, she received  diltiazem, developed pauses, and subsequently underwent implantation of a  pacemaker by Dr. Graciela Husbands.  She tolerated the procedure well.  Chest x-ray had  revealed some improvement in her right pleural effusion compared to A CT on  September 25.  She was supposedly discharged on diltiazem XT 120 daily.  She  never got a prescription for it.  She had Inderal LA 120 mg, Coumadin,  Lexapro 10 mg, diclofenac, __________, Lyrica, Tricor, simvastatin 40 mg,  and metformin 1 g b.i.d., AcipHex 20 mg, Vesicare 10 mg, ferrous sulfate 325  mg, and Centrum Silver.  The patient has noticed some increase in shortness  of breath.  She was seen yesterday and referred back here for further care.  She was also given doxycycline 100 mg b.i.d.   The patient has no chest pain.  She does have some shortness of breath and  general fatigue.  Her pacemaker was interrogated last week.   PHYSICAL EXAMINATION:  VITAL SIGNS:  Blood pressure 140/80, pulse 122 in  atrial fibrillation.  GENERAL APPEARANCE:  Stable, in no distress.  NECK:  JVP is not elevated.  LUNGS:  Slight decreased breath sounds in the right base.  CARDIAC:  Atrial fibrillation with increased ventricular rate.  ABDOMEN:  Unremarkable.  EXTREMITIES:  1+ edema.   IMPRESSION:  1. Paroxysmal atrial fibrillation now with poorly-controlled ventricular      rate.  2. Status post implantation of  ventricular pacemaker by Dr. Graciela Husbands because      of brady-tachy syndrome; that is, she developed greater than 5-second      pauses while on Inderal and Cardizem.  3. Additional diagnoses as noted in the discharge summary, including small      cell lung cancer with pericardial effusion and pleural effusion.  She      notes she had a right-sided thoracentesis during the recent      hospitalization.   I have suggested taking extra Inderal LA 120 mg tonight, and I have given  her diltiazem 180 mg now and she will take both again in the morning.  I  will plan to see her in the morning, then  depending on her heart rate make a decision about further therapy.  Certainly, if she is not better controlled she may need to be hospitalized.   ADDENDUM:  She was admitted in early September with encephalopathy thought  to be related to Klebsiella infection.      ______________________________  E. Graceann Congress, MD, [facility]   WUJ#[id number]  EJL/MedQ  DD: 03/03/2006  DT: 03/03/2006  Job #: [id number]

## 2010-09-26 NOTE — Assessment & Plan Note (Signed)
[facility]                         ELECTROPHYSIOLOGY OFFICE NOTE   TAYNA, SMETHURST                       MRN:          [id number]  DATE:04/22/2006                            DOB:          1931/06/24    [name] is seen following a pacemaker implantation for atrial  fibrillation and syncope.  She had a stitch abscess that Dr. Ladona Ridgel  helped I and D and is doing very well.  Today, it looks almost  completely healed.   We will see her again as previously scheduled for a device followup in  about 6-8 weeks.     [name], MD, [facility]  Electronically Signed    SCK/MedQ  DD: 04/22/2006  DT: 04/22/2006  Job #: [id number]

## 2010-09-26 NOTE — Discharge Summary (Signed)
NAMEYANEL, DOMBROSKY                ACCOUNT NO.:  [id number]   MEDICAL RECORD NO.:  [id number]          PATIENT TYPE:  INP   LOCATION:  1442                         FACILITY:  [facility]   PHYSICIAN:  Lonia Blood, M.D.      DATE OF BIRTH:  [birth year]   DATE OF ADMISSION:  09/05/2006  DATE OF DISCHARGE:  09/09/2006                               DISCHARGE SUMMARY   PRIMARY CARE PHYSICIAN:  [facility].   DISCHARGE DIAGNOSES:  1. Femoral fracture, right hip fracture, status post surgical      reduction.  2. Chronic normocytic anemia exacerbated by acute blood loss.  3. Type 2 diabetes.  4. Hypertension.  5. History of small cell lung cancer in the past.  6. Recent methicillin-resistant Staphylococcus aureus ventilator      assisted pneumonia.  7. Atrial fibrillation.  8. Bilateral avascular necrosis of the hip joint per x-rays.   DISCHARGE MEDICATIONS:  1. Coreg 6.25 mg daily.  2. Lovenox 40 mg subcutaneously daily.  3. Pepcid 20 mg b.i.d.  4. Lopressor 25 mg q.8 hours.  5. Ferrous sulfate 325 mg b.i.d.  6. Albuterol nebulizer 2.5 mg q.6 hours.  7. Lexapro 10 mg daily.  8. Tylenol 650 mg q.6 hours p.r.n.  9. Lantus 10 units subcutaneously at night.  10.Zocor 40 mg daily.  11.Humulin R sliding scale insulin.   DISPOSITION:  The patient will be transferred to [facility]  for skilled nursing care.  She will need a lot of PT and OT after her  surgery.  She will have follow-up with orthopedic surgeon, Loraine Leriche C.  Ophelia Charter, M.D., in 2-3 weeks.   PROCEDURE:  1. Pentax trochanteric surgical screw insertion as part of right IHS      surgery by Dr. Ophelia Charter on September 06, 2006.  2. Right hip x-ray complete that showed acute right intratrochanteric      hip fracture, bilateral humoral head avascular necrosis without      contour abnormality performed on April 27.  3. Chest x-ray on April 27, showed no acute disease, stable loculated      right pleural effusion, stable  calcified left upper lobe nodule      likely a granuloma, chronic interstitial prominence of the lungs,      cardiomegaly, and dual lead pacer in place.  4. Follow-up femoral x-ray on April 28, showed satisfactory reduction      of the right proximal humeral fracture.  Intramedullary nail and      proximal pin appear in satisfactory position and alignment.   CONSULTATIONS:  Mark C. Ophelia Charter, M.D., orthopedics.   BRIEF HISTORY AND PHYSICAL:  Please refer to dictated history and  physical on admission by Della Goo, M.D.  In short, however, this  is a 77 year old female that had a fall in the nursing facility and was  brought in with severe right hip pain.  The patient has multiple medical  problems including coronary artery disease, diabetes, recent ventilator  associated pneumonia.  On admission the patient was found to have  evidence of the pain in the right hip  area.  Also anemic with hemoglobin  of 10.0 and x-ray finding of the right hip fracture.  Hence, she was  admitted for further management.   HOSPITAL COURSE:  Problem 1.  Right hip fracture.  The patient was seen  by orthopedic surgery.  She underwent surgical reduction of her  fracture.  Medically she was cleared after full investigation by the  medical team.  Since surgery she has remained very stable, minimal pain  at best.  She will have her follow-up with Dr. Ophelia Charter.   Problem 2.  History of VAP.  The patient was placed in contact  isolation.  She did very well.  She did not require any antibiotics.  No  problem with her respiratory tract.   Problem 3.  Hypertension.  This was also controlled using home  medications.   Problem 4.  Diabetes.  Her blood sugar was fine on insulin throughout  hospitalization and she was discharged on the same.   Problem 5.  Atrial fibrillation.  The patient has been off Coumadin due  to weakness.  She had a pacemaker in place.  She was off  anticoagulation, but currently back on  Lovenox which she has been taking  prior to arrival in the hospital and we will discharge her home on the  same Lovenox.  Otherwise the rest of her medical problems have been  stable this hospitalization.      Lonia Blood, M.D.  Electronically Signed     LG/MEDQ  D:  09/09/2006  T:  09/09/2006  Job:  [id number]

## 2010-09-26 NOTE — Discharge Summary (Signed)
[name], [name]                ACCOUNT NO.:  [id number]   MEDICAL RECORD NO.:  [id number]          PATIENT TYPE:  INP   LOCATION:  5030                         FACILITY:  [facility]   PHYSICIAN:  Valerie A. Felicity Coyer, MDDATE OF BIRTH:  04/03/1932   DATE OF ADMISSION:  01/09/2006  DATE OF DISCHARGE:  01/13/2006                                 DISCHARGE SUMMARY   DISCHARGE DIAGNOSES:  1. Mental status change/encephalopathy secondary to Klebsiella      UTI/pyelonephritis.  2. Pleural effusion, question recurrent lung CA.  3. Diabetes Type 2.  4. Iron deficiency anemia.   HISTORY OF PRESENT ILLNESS:  [name] is a 77 year old white female whose  chief complaint on admission was nausea, vomiting and mental status change.  She lives with her grandson and was found by her grandson on the floor.  The  patient reportedly had been complaining of left-sided flank pain over  several days prior to admission.  She has also been complaining of fatigue  and not feeling like herself reportedly over the last several months.  The  patient was admitted for further evaluation and treatment.   PAST MEDICAL HISTORY:  1. Diabetes Type 2.  2. Hypertension.  3. Dyslipidemia.  4. History of lung cancer status post chemo and radiation followed by Dr.      Myna Hidalgo.  5. History of gallbladder surgery.   COURSE OF HOSPITALIZATION:  1. Change in mental status/encephalopathy secondary to Klebsiella      UTI/pyelonephritis.  The patient was admitted and urine culture was      sent which grew greater than 100,000 of Klebsiella which was      cephalosporin sensitive.  She was started on IV cefepime which was      later changed to p.o. Ceftin.  The patient has remained afebrile.  Her      mental status has returned to baseline and she is ambulating with PT.      It is recommended at time of discharge that the patient be set up with      home health PT and OT.  It is recommended that she use a cane for  walking.  She will be continued on Ceftin for a total of ten days of      antibiotic therapy.  2. Pleural effusion, question recurrent lung CA.  The patient underwent a      chest x-ray during this admission which showed right pleural disease      and they could not rule out recurrent lung CA.  Will defer the      patient's primary care to order possible PET scan.  The patient will      also need followup with Dr. Myna Hidalgo as an out-patient.  3. Iron deficiency anemia.  The patient was noted during this admission to      be anemic.  Iron level was less than 10 and she was started on p.o.      iron supplementation which will be continued at time of discharge.   MEDICATIONS AT DISCHARGE:  1. Ceftin 250 mg p.o. b.i.d. through  January 20, 2006.  2. Metoprolol 25 mg p.o. daily.  3. Lexapro 10 mg p.o. daily.  4. Diclofenac 75 mg p.o. b.i.d.  5. Provigil 100 mg p.o. daily.  6. Lyrica 75 mg p.o. daily.  7. Tricor 145 mg p.o. daily.  8. Simvastatin 40 mg p.o. daily.  9. Metformin 1000 mg p.o. b.i.d.  10.Aciphex 20 mg p.o. daily.  11.Vesicare 10 mg p.o. daily.  12.Iron 325 mg p.o. b.i.d.  13.Centrum silver, one tab p.o. daily.   PERTINENT LABORATORIES AT DISCHARGE:  Hemoglobin 10.3, hematocrit 30, BUN  12, creatinine 0.9, urine culture greater than 100,000 Klebsiella.  TSH  1.089.  Hemoglobin A1c 5.6.   DISPOSITION/PLAN:  Transfer patient home with home health PT and OT.   FOLLOW UP:  The patient is to follow up with Dr. Scotty Court on Wednesday,  January 20, 2006 at 3:00 p.m.  She is instructed to call Dr. Scotty Court  should she develop fever over 101, weakness, nausea or vomiting.     ______________________________  Sandford Craze, PA      Raenette Rover. Felicity Coyer, MD  Electronically Signed    MO/MEDQ  D:  01/13/2006  T:  01/13/2006  Job:  [id number]   cc:   Ellin Saba., MD  Rose Phi Myna Hidalgo, M.D.

## 2010-09-26 NOTE — H&P (Signed)
[name], KORAN                ACCOUNT NO.:  [id number]   MEDICAL RECORD NO.:  [id number]          PATIENT TYPE:  INP   LOCATION:  1843                         FACILITY:  [facility]   PHYSICIAN:  Gordy Savers, MDDATE OF BIRTH:  1932-01-02   DATE OF ADMISSION:  07/06/2006  DATE OF DISCHARGE:                              HISTORY & PHYSICAL   CHIEF COMPLAINT:  Cough and shortness of breath.   HISTORY OF PRESENT ILLNESS:  The patient is a 77 year old white female  who presented the emergency department complaining of increasing  weakness, right-sided pleuritic chest pain, cough and congestion.  She  has had some low-grade fever.  ED evaluation included chest x-ray that  revealed pneumonia.  In the emergency department setting, she was noted  have a low-grade temperature and a mild leukocytosis with a white count  15.1.  Blood cultures and screening lab were obtained and the patient  has been treated with Rocephin and azithromycin.  She is now admitted  for further evaluation and treatment of her community-acquired  pneumonia.   PAST MEDICAL HISTORY:  The patient has a number of medical problems  including coronary artery disease and atrial fibrillation.  She has a  tachybrady syndrome and in October of this year, had a permanent  pacemaker placed.  She has a history of small cell lung cancer,  peripheral neuropathy, gastroesophageal reflux disease, depression and  hyperlipidemia.  The patient was hospitalized briefly in the Fall and  did have a right-sided pleural effusion; thoracentesis revealed no  evidence of malignancy.  Additionally, she has a history of type 2  diabetes and iron deficiency anemia.  She did have a heart  catheterization in September 2004 that revealed an ejection fraction  that was normal and three-vessel coronary artery disease; medical  therapy was recommended.  She has a history of arthritis and depression.   MEDICATIONS:  She is on multiple  medications including:  1. AcipHex 20 mg daily.  2. Coumadin 5 mg daily.  3. Diclofenac 75 mg b.i.d.  4. Diltiazem extended release 120 daily.  5. Iron sulfate 325 mg b.i.d.  6. Inderal LA 120 mg daily.  7. Lexapro 10 mg daily.  8. Lyrica 75 mg daily.  9. Metformin 1 g b.i.d.  10.Provigil 100 mg daily.  11.Simvastatin 40 mg daily.  12.TriCor 145 mg daily.  13.Vesicare 10 mg daily.  14.Additionally, she has been on multiple vitamins p.r.n..  15.Tylenol.   SOCIAL HISTORY:  She lives in [place] with a grandson.  She has 4  children, multiple grandchildren and great-grandchildren.  She is  widowed.  She is a former smoker, but not in 25 years.   FAMILY HISTORY:  Father died when the patient was young of unknown  causes.  Mother died at 22 of an MI; she did have a history of diabetes.  Two brothers and 3 sisters are deceased, unclear causes.   EXAMINATION:  GENERAL:  A well-developed elderly white female who  appeared mildly confused with some mild resting tachypnea.  VITAL SIGNS:  O2 saturation on arrival was 90%, temperature 98.1, heart  rate 105.  HEAD AND NECK:  Exam revealed normal pupillary responses, conjunctivae  clear.  Oropharynx was slightly dry.  Dentition was in poor repair.  Neck supple.  No adenopathy or neck vein distention.  CHEST:  Revealed coarse rhonchi bilaterally, more involving the right  lower lung field.  CARDIOVASCULAR:  Exam revealed a regular tachycardia.  No murmur was  identified.  ABDOMEN:  Soft and nontender.  No organomegaly.  EXTREMITIES:  No edema.  Dorsalis pedis pulses were full.  Posterior  tibial pulses were not easily palpable.  There is no peripheral edema or  cyanosis.   IMPRESSION:  Community-acquired pneumonia.   ADDITIONAL DIAGNOSES:  1. Type 2 diabetes.  2. Tachybrady syndrome.  3. Chronic Coumadin.  4. History of small cell lung cancer.  5. Dyslipidemia.  6. Peripheral neuropathy.  7. Depression.   DISPOSITION:  The  patient will be admitted to the hospital.  She will be  continued on Rocephin and azithromycin.  Pulmonary toilet will be  instituted.  At this point, her creatinine is 1.9 and her metformin  therapy will be held.  Capillary blood sugars will be followed 3 times  daily before meals and the patient will be covered with a sliding-scale  regimen of short-acting insulin.  Pulmonary toilet will be instituted.      Gordy Savers, MD  Electronically Signed     PFK/MEDQ  D:  07/06/2006  T:  07/07/2006  Job:  [id number]

## 2010-09-26 NOTE — Consult Note (Signed)
[name], [name]                ACCOUNT NO.:  [id number]   MEDICAL RECORD NO.:  [id number]          PATIENT TYPE:  INP   LOCATION:  1442                         FACILITY:  [facility]   PHYSICIAN:  Mark C. Ophelia Charter, M.D.    DATE OF BIRTH:  07/01/1931   DATE OF CONSULTATION:  09/06/2006  DATE OF DISCHARGE:                                 CONSULTATION   REQUESTING PHYSICIAN:  [facility]. Dr. Della Goo.   REASON FOR CONSULTATION:  Right introchanteric fracture.   HISTORY OF PRESENT ILLNESS:  A 77 year old female diabetic, history of  coronary artery disease, atrial fib chronic, diabetes type 2, small cell  lung cancer  2004 on chronic Lovenox with pacemaker, hypertension was  admitted to the medical service with introchanteric fracture after a  fall.  X-rays demonstrated introchanteric fracture displaced.  She is a  good candidate for stabilization so she would mobilized.  Lovenox is  held.   Examine of her foot demonstrates sciatic nerve intact.  Pulses are 2+.   PLAN:  Planned procedure is discussed with the patient.  We will plan on  surgery today and then she can resume  her diabetic diet, sliding scale,  etc.  She is mobilized, weight bearing as tolerated and in a couple of  days should be able to plan on placement.  Thank you for the opportunity  to see her in consultation.      Mark C. Ophelia Charter, M.D.  Electronically Signed     MCY/MEDQ  D:  09/06/2006  T:  09/07/2006  Job:  [id number]   cc:   Della Goo, M.D.  Fax: [phone]

## 2010-09-26 NOTE — Op Note (Signed)
NAME:  [name], [name]                          ACCOUNT NO.:  [id number]   MEDICAL RECORD NO.:  [id number]                   PATIENT TYPE:  AMB   LOCATION:  ENDO                                 FACILITY:  [facility]   PHYSICIAN:  Oley Balm. Sung Amabile, M.D. [facility]          DATE OF BIRTH:  05-07-1932   DATE OF PROCEDURE:  DATE OF DISCHARGE:                                 OPERATIVE REPORT   PROCEDURE:  Bronchoscopy.   SURGEON:  Oley Balm. Simonds, M.D. [facility]   INDICATIONS FOR PROCEDURE:  Right upper lobe lung mass.   PREMEDICATION:  Versed 4 mg IV and Fentanyl 100 mcg IV.   ANESTHESIA:  Topical anesthesia was applied to the nose and throat and 45 mL  of 1% lidocaine were used during the course of the procedure.   DESCRIPTION OF PROCEDURE:  After adequate sedation and anesthesia the  bronchoscope was introduced via the left nares and advanced into the  posterior pharynx.  This demonstrated normal upper airway anatomy.  The  vocal cords moved symmetrically.  Further anesthesia was achieved with 1%  lidocaine and the scope was advanced into the trachea.  Complete airway  anesthesia was achieved with 1% lidocaine and an airway examination was  performed.  This demonstrated normal segmental bronchial anatomy.  The  bronchial mucosa was normal except for the findings described below.  There  were no endobronchial foreign bodies and no bleeding noted.  At the main  carina there was mild splaying and increased vascularity.  At the takeoff of  the right upper lobe bronchus there was tumor eroding through the bronchial  wall and partially obstructing the right upper lobe bronchus.  After  completion fo the airway examination the following specimens were obtained.  First, transbronchial needle aspiration was performed.  However, the  returning specimen was excessively bloody and therefore no further passes  with the needle were performed.  Next, cytology brushing was obtained in the  right upper lobe at  its takeoff.  Next, washings were obtained and further  washings were performed during subsequent biopsies.  Lastly, endobronchial  biopsies were performed at the level of the tumor described above.  Five  biopsies were performed in total.  There was moderate bleeding after the  first biopsy and 0.5 mL of 1% lidocaine was instilled to control the  bleeding so that further biopsies could be performed.  Overall, there were  mild to at most moderate amounts of bleeding which had resolved completely  by the completion of the procedure.  The above specimens were sent for  cytology and surgical pathology.   PLAN:  The findings were discussed with the patient's daughter.  Presently,  the patient is excessively sedated to be able to discuss our findings.  She  will see me in follow-up in the office on the day following this procedure.  Hopefully, the results will be back by that time, but regardless whether  they are or not, I intend to refer her to oncology and to complete her  staging workup.                                               Oley Balm Sung Amabile, M.D. [facility]    DBS/MEDQ  D:  02/19/2003  T:  02/19/2003  Job:  [id number]

## 2010-09-26 NOTE — Discharge Summary (Signed)
NAMEFINESSE, FIELDER                ACCOUNT NO.:  [id number]   MEDICAL RECORD NO.:  [id number]          PATIENT TYPE:  INP   LOCATION:  5739                         FACILITY:  [facility]   PHYSICIAN:  Charlcie Cradle. Delford Field, MD, FCCPDATE OF BIRTH:  10-[birth year]   DATE OF ADMISSION:  07/06/2006  DATE OF DISCHARGE:                               DISCHARGE SUMMARY   DISCHARGE DIAGNOSES:  1. Ventilator-dependent respiratory failure with methicillin-resistant      staphylococcus aureus.  2. Dysphagia with modified barium swallow on March 25 demonstrating      requirement of a dysphagia III nectar thick diet.  3. Diarrhea, which is improved.  4. Hypokalemia, which has been repleted.  5. Chronic atrial fibrillation, which is now on a controlled      ventricular rate.  6. Diabetes mellitus.  7. Debility.   HISTORY OF PRESENT ILLNESS:  [name] is a 77 year old white  female who presented to [facility] on July 06, 2006 with a  chief complaint of cough and shortness of breath.  She had right-sided  pleuritic chest pain with cough and congestion.  Chest x-ray revealed  pneumonia.  In the emergency department setting, she was noted to have a  low-grade temperature, mild leukocytosis, and a white count of 15.1.  She was admitted for further evaluation and treatment.  She has a known  past medical history, including coronary artery disease with chronic  atrial fibrillation, a tachy brady syndrome.  In October of this year,  she had a permanent pacemaker placed.  She has a history of small cell  lung cancer, peripheral neuropathy, gastroesophageal reflux disease,  depression, and hyperlipidemia.  She has been briefly hospitalized in  the past and did have a resolved pleural effusion.  Thoracentesis  revealed no malignancy.  In addition, she has a history of type 2  diabetes, iron-deficiency anemia.  She did have a heart catheterization  in 2004 which revealed an ejection fraction that  was normal and three  vessel coronary artery disease.  Medical therapy was recommended.  She  also has a history of arthritis and depression.   LAB DATA:  Sodium 139, potassium 3.9, chloride 104, CO2 30, glucose 76,  BUN 10, creatinine 0.52, calcium 8.5, magnesium 2.2.  Arterial blood gas  on 40% FiO2 assist control with tidal volume of 550, rate of 10, and 5  of PEEP, 7.46, pCO2 31, pO2 123.  WBCs 10, hemoglobin 9.6, hematocrit  28.3, platelets 202.  Heparin level is 0.36.  Total protein 5.2, albumin  1.3, AST 42, ALT 122, total bilirubin 0.5, magnesium 1.8, phosphorus  3.3.  CK is 140.  Digoxin level is less than 0.2.  Liver fluid pH is 6.  Appearance yellow to clear.  Specific gravity 1.012.  Glucose negative.  Hemoglobin moderate amount.  Negative for bilirubin, negative for  ketones, protein of 30 with rare bacteria.  No growth was noted from the  body fluid culture.  Urine culture showed greater than 100,000 yeast.  Lipase of the pleural fluid was 14.  Cytopathology of pleural fluid  shows reactive  mesothelial cells present, acute inflammation, no  atypicals or malignant cells are identified.  Sputum cultures  demonstrated MRSA, methicillin-resistant staphylococcus aureus.   RADIOGRAPHIC DATA:  There is an increase in her right pleural effusion,  atelectasis changes at the bases.  Pulmonary vascular congestion is  again noted.   PROCEDURES:  She had orotracheal placed on February 28 with subsequent  removal on March 12 with reintubation on March 12 and successful  liberation from mechanical ventilatory support on July 27, 2006 after  being made a no code blue.  Left subclavian central venous catheter on  February 28, removed on March 3 with a left internal jugular CVL placed.  She had a thoracentesis on March 1 per Dr. Sung Amabile with 1200 cc of  transudate fluid.  She had a right chest tube placed on March 9 by Dr.  Danice Goltz, a 28 [place].  She had a modified barium swallow  on March  25, which resulted in a dysphagia III diet with nectar-thick liquids.  She had a repeat thoracentesis on March 25 with 730 cc from a right  pleural effusion.   HOSPITAL COURSE BY DISCHARGE DIAGNOSES:  1. Ventilator-dependent respiratory failure with methicillin-resistant      staphylococcus aureus in the sputum:  Her sputum on February 29 was      positive for MRSA.  On February 10, her urine was positive for      yeast.  She required orotracheal intubation after being admitted to      [facility].  Her sputum was positive for methicillin-      resistant staphylococcus aureus, required mechanical ventilatory      support.  She was weaned on March 12 but subsequently required      reintubation on March 12.  Her pneumonia was treated with      vancomycin along with the addition of Zosyn.  After discussion with      family on March 18, she was re-extubated with no re-intubation and      no further mention of tracheostomy or further aggressive care.  She      was made a no code blue at that time.  She was successfully      liberated from mechanical ventilatory support and is ready for a      skilled nursing facility at this time.   1. Septic shock with Klebsiella bacteremia.  This has been resolved      with the antimicrobial therapy and vasopressor therapy.   1. Chronic atrial fibrillation:  She had been on Coumadin in the past      but due to her weakened health, she could no longer be placed on      Coumadin.  She will be controlled with Cardizem, dig, and      Lopressor.   1. Diabetes mellitus 2, which is being addressed with the appropriate      treatments.   1. Anemia, which is multifactorial and remains relatively stable.   1. Diarrhea, which is negative for C. diff.  She has been treated with      lactobacillus, and this will be continued on an outpatient basis.   DISCHARGE MEDICATIONS: 1. Xopenex 0.63 inhaled nebulizers q.6h.  2. Lovenox 40 mg subcu  q.24h.  3. Lactinex 1 packet q.i.d.  4. Lopressor 25 mg p.o. q.8h.  5. Sliding-scale insulin.  Less than 60, hypoglycemic protocol; 61-      100, no insulin; 101-150, 2 units of Humulin R; 151-200,  3 units;      201-250, 5 units; 251-300, 7 units; 301-350, 9 units; greater than      350, 11 units; greater than 400, repeat and call MD.  6. Lantus 10 units subcu daily in the a.m. with 5 units nightly.  7. Pepcid 20 mg b.i.d.  8. Normal saline flush to peripheral IV q.12h.  9. Petroleum jelly to affected area as needed.  10.Tylenol 650 mg p.o. q.6h.  11.Diltiazem 80 mg p.o. q.24h.  12.Ferrous sulfate 325 mg p.o. b.i.d.  13.Lexapro 10 mg q.24h.  14.Simvastatin 40 mg p.o. q.24h.  15.Coreg 6.25 mg q.24h.  16.Percocet 1 tablet every 8 hours p.r.n.  17.Benadryl 25 mg nightly p.r.n. sleep.   DIET:  Dysphagia III nectar thick, with close supervision for vent  aspiration.   She has reached maximal hospital benefit.  She is to be transferred to a  skilled nursing facility.  She is an out-of-facility do not intubate, do  not resuscitate order.  She has reached maximal hospital benefit and is  being transferred to a skilled nursing facility, hopefully close to her  family in [place], [place].      Barbara Dopp, MSN, ACNP      Charlcie Cradle. Delford Field, MD, [facility]  Electronically Signed    SM/MEDQ  D:  08/03/2006  T:  08/03/2006  Job:  [id number]   cc:   Gordy Savers, MD

## 2010-09-26 NOTE — Discharge Summary (Signed)
[name], [name]                ACCOUNT NO.:  [id number]   MEDICAL RECORD NO.:  [id number]          PATIENT TYPE:  INP   LOCATION:  1515                         FACILITY:  [facility]   PHYSICIAN:  Sean A. Everardo All, M.D. [facility] OF BIRTH:  03/07/1932   DATE OF ADMISSION:  12/05/2004  DATE OF DISCHARGE:  12/06/2004                                 DISCHARGE SUMMARY   REASON FOR ADMISSION:  Ataxia.   HISTORY OF PRESENT ILLNESS:  A 77 year old woman admitted by me yesterday  with severe ataxia.  Please refer to my dictated history and physical for  details.   HOSPITAL COURSE:  The patient was admitted and the Ativan was held.  By the  morning of December 06, 2004 the patient was alert, oriented, and steady on her  feet without assistance.  She was thus discharged home in fair condition.  I  prescribed her BuSpar in leu of the Ativan.   DIAGNOSES AT THE TIME OF DISCHARGE:  1.  Somnolence due to Ativan, much better.  2.  As stated in the history and physical.   MEDICATIONS:  1.  Aciphex 20 mg daily.  2.  BuSpar 15 mg twice daily.  3.  Vicodin 5/500 every 4 hours as needed for pain.  4.  Lipitor 20 mg q.h.s.  5.  Lexapro 10 mg daily.  6.  Lyrica 75 mg daily.   FOLLOW UP:  With Dr. Scotty Court within two weeks.   DIET/ACTIVITY:  No restriction.       SAE/MEDQ  D:  12/06/2004  T:  12/06/2004  Job:  [id number]   cc:   Ellin Saba., M.D.  [address] [place]  [place] [address]  Fax: [phone]

## 2010-09-26 NOTE — Assessment & Plan Note (Signed)
[facility]                              CARDIOLOGY OFFICE NOTE   WINFRED, REDEL                       MRN:          [id number]  DATE:03/16/2006                            DOB:          08-08-1931    A very pleasant 77 year old white female with coronary artery disease,  hyperlipidemia, diabetes, small cell lung cancer with pleural effusion,  peripheral neuropathy, GERD, depression and recent paroxysmal atrial  fibrillation.  We do not have a copy of her recent discharge summary, and  she did not bring her medications.   When I saw her on the 24th of October, we increased her Inderal and gave her  Diltiazem.  Apparently, she continued to have problems with increased rate,  and CHF, was admitted on March 05, 2006.  This is her 3rd admission in  approximately 2 months, initially with a urinary tract infection.   She was thought to have some diastolic CHF exacerbated by atrial  fibrillation and some slight renal insufficiency.  The patient was given IV  Lopressor initially to slow her rate, resumed the Diltiazem.  She ultimately  converted to normal sinus rhythm, and she has felt quite well since  discharge with no cardiac symptoms.  She has had some diarrhea, for which  she is taking Flagyl.  She is also on Lasix, Lexapro, Voltaren 75,  __________, Lyrica, Tricor, Zocor 40, Metformin 1 gm b.i.d., Estrostep,  Vesicare and Coumadin per [facility], Diltiazem 360.   Blood pressure 125/71.  Pulse 86.  Normal sinus rhythm.  GENERAL:  Appears normal.  JVP not elevated.  LUNGS:  Clear.  CARDIAC EXAM:  Unremarkable.  EXTREMITIES:  No edema.   EKG reveals normal sinus rhythm, rate of 86, Borderline PR prolongation,  right atrial abnormality.   IMPRESSION:  Diagnoses as above.  Patient remains in normal sinus rhythm.  I  suggested continuing on the same therapy.  We will plan on seeing her back  in 2 months or p.r.n.  Hopefully, she will  maintain sinus rhythm.  She is to  have a BNP on return.    ______________________________  E. Graceann Congress, MD, [facility]    EJL/MedQ  DD: 03/16/2006  DT: 03/16/2006  Job #: [id number]

## 2010-09-26 NOTE — Assessment & Plan Note (Signed)
[facility]                                 ON-CALL NOTE   DESAREA, OHAGAN                         MRN:          [id number]  DATE:07/02/2006                            DOB:          [birth year]    The phone call comes from her daughter, Floreen Comber, at [phone].  Patient of Dr. Scotty Court.  Phone call at 7:43 p.m.  Ms. Prevo is a  patient of Dr. Scotty Court and Dr. Glennon Hamilton.  She lives with a grandson,  I guess, but has been in bed for a couple of days.  The daughter came to  see her, she has not been able to get out of bed, has not been eating or  drinking, had not even been able to make it to the bathroom and now is  incontinent in bed.  Her daughter feels that she needs to be seen in the  emergency room.   PLAN:  I agreed with her based on the current symptoms that she should  be seen in the emergency room.  I told them if she was not in acute  distress like in respiratory difficulty that they could bring her in  herself.  We discussed what would happen if she needed to be admitted or  not, and that an emergency room physician would evaluate her in the ER,  and she was going to proceed.     Karie Schwalbe, MD  Electronically Signed    RIL/MedQ  DD: 07/02/2006  DT: 07/02/2006  Job #: [id number]   cc:   Ellin Saba., MD

## 2010-09-26 NOTE — H&P (Signed)
NAMEJOSELLE, [name]                ACCOUNT NO.:  [id number]   MEDICAL RECORD NO.:  [id number]          PATIENT TYPE:  INP   LOCATION:  1846                         FACILITY:  [facility]   PHYSICIAN:  Thomas C. Wall, MD, FACCDATE OF BIRTH:  1931/06/11   DATE OF ADMISSION:  03/05/2006  DATE OF DISCHARGE:                                HISTORY & PHYSICAL   PRIMARY CARE PHYSICIAN:  Dr. Scotty Court.   PRIMARY CARDIOLOGIST:  Dr. Corinda Gubler.   EP CARDIOLOGIST:  Dr. Graciela Husbands.   ONCOLOGIST:  Dr. Myna Hidalgo.   BRIEF HISTORY:  [name] is a 77 year old white female who was transported  via EMS secondary to shortness of breath to [facility] for further  evaluation.  [name] describes some shortness of breath that began a few  days after her discharge on February 19, 2006.  She is not a very detailed  historian, her daughter provides much of the information.  The patient and  daughter both feel that her shortness of breath became particularly bad  since this past Tuesday.  Patient describes a rattling in her throat with  increased dyspnea on exertion.  She denies any edema or weight gain or  associated pleuritic discomfort or chest discomfort.  She does describe  orthopnea and PND last night.  The patient states that she saw Dr. Corinda Gubler  twice this week.  Daughter elaborates that this was on Wednesday and  Thursday for shortness of breath.  Daughter relates that her heart rate has  been increased and Dr. Corinda Gubler has been adjusting some of her medications to  help control the heart rate.  He felt that her shortness of breath was  secondary to her elevated heart rate.  She had been given extra medication  in the office.  This morning, her daughter called EMS because the shortness  of breath had worsened.  EMS report is not available, but according to the  patient her oxygen saturations were probably low.   On review of medications with the daughter, it is noted that when patient  was discharged  from the hospital, she was discharged on diltiazem XT 120  daily; however, since her discharge, she has not gotten this medication from  the pharmacy.  The daughter states, that initially, she thought she had the  medication; however, apparently the pharmacy never filled it because they  did not have the medication.  They did not inform the daughter of this.  This was not realized until a day or two ago.  She still has not been able  to obtain the calcium channel blocker from her pharmacy.  She also  elaborates the patient has not taken her medications yet today.   PAST MEDICAL HISTORY:  NO KNOWN DRUG ALLERGIES.   1. Coumadin 5 mg, her current regimen is 1.5 mg for two days and then 1      tablet daily, this was adjusted yesterday secondary to a low INR (INR      not available).  2. Inderal LA 120 daily.  3. Iron 325 b.i.d., another iron preparation 75 daily.  4.  Provigil 100 mg p.o. daily.  5. Lyrica 75 mg q.p.m.  6. TriCor 145 daily.  7. Simvastatin 40 q.h.s.  8. Metformin 1000 mg b.i.d.  9. Aciphex 20 mg daily.  10.Multivitamin daily.  11.Vesicare 10 mg daily.   Her history is notable for:  1. Hyperlipidemia, unknown last check.  2. Noninsulin dependent type 2 diabetes with a hemoglobin A1c of 5.6 on      April 14, 2006 associated with peripheral neuropathy.  3. GERD.  4. Hypertension.  5. Small cell lung cancer in the past with radiation and chemotherapy.  6. Iron deficiency anemia.  7. Recently hospitalized for paroxysmal atrial fibrillation, associated      with brady/tachy syndrome.  She underwent [employer] adapted pacer on      February 15, 2006 by Dr. Graciela Husbands.  Echocardiograms during that time, on the      10th of October, showed an EF of 55%, confirmed a moderate pericardial      effusion without evidence of tamponade.  Trivial AI.  During this      admission, she also had a right thoracentesis secondary to a pleural      effusion.  Cytology was negative for  malignancy.  8. She has a history of coronary artery disease, catheterization on      February 01, 2003, showed an EF of 80%, 3-vessel coronary artery      disease with heavily calcified and small vessels.  It was recommended      medical treatment.  9. She has a history of arthritis and depression.   SOCIAL HISTORY:  She resides in [place] with her grandson.  She has 4  children, 9 grandchildren, 5 great-grandchildren.  She is retired from  _________ for 33 years, where she worked.  She is widowed.  She has not  smoked in 25 years.  Prior to that, she was smoking at least 2 packs per day  for 40 years.  She denies any alcohol, drugs, herbal medications, specific  diet or exercise program.   FAMILY HISTORY:  Mother died at age of 46 with a myocardial infarction,  history of diabetes.  Father, unknown causes, deceased, patient was 7-years-  old.  She has 2 brothers and 3 sisters that are deceased.  One sister died  at the age of 28, uncertain causes, another with cancer and CVA.   REVIEW OF SYSTEMS:  In addition to above, is notable for occasional  headaches, poor dentition, reading glasses, possible hearing loss,  nonproductive cough, wheezing, dysuria, nocturia, post menopausal, weakness,  depression, anxiety without recent change, left shoulder and wrist  arthralgias, rare diarrhea and abdominal discomfort.  Remainder of systems  are negative.   PHYSICAL EXAMINATION:  GENERAL:  A well-nourished, well-developed, pleasant  white female in no apparent distress.  VITAL SIGNS:  Temperature is 95.2.  Blood pressure 132/76.  Pulse 88,  irregular but her pulse varies during interview between the 90s and 130s.  Respirations 22, 95% saturation on 3 liters.  HEENT:  Essentially unremarkable, except for some poor dentition.  No  obvious abscesses or cares.  NECK:  Supple without thyromegaly, adenopathy, JVD or carotid bruits. CHEST:  Symmetrical excursions.  Decreased breath sounds  throughout, but  most particularly in the right base.  I do not appreciate any rales, rhonchi  or wheezing.  HEART:  PMI is not displaced.  Irregular irregular rhythm.  Heart sounds are  distant.  I do not appreciate any murmurs, rubs, clicks or gallop.  SKIN:  Integument appears to be intact.  She does have a healing,  approximately 1 cm, lesion in her left upper quadrant.  ABDOMEN:  Obese.  Bowel sounds present without organomegaly, masses or  tenderness.  EXTREMITIES:  Negative cyanosis, clubbing or edema.  MUSCULOSKELETAL:  Grossly unremarkable.  NEURO:  Grossly unremarkable.   Chest x-ray shows some new atelectasis and right upper lobe opacification.  Stable left upper lobe nodule.  EKG shows atrial fibrillation with a  ventricular rate of 91, long axis, normal intervals, delayed R-waves, low  voltage.  Changed from EKG February 15, 2006.  H&H is 11.1 and 32.9, normal  MCVs, platelets 244, WBCs 7.2.  Sodium 139, potassium 5.3, BUN 36,  creatinine 1.6, glucose 121.  BNP was elevated at 632.  PTT 33, PT 15.3.   IMPRESSION:  1. Atrial fibrillation with a rapid ventricular rate.  Part of the problem      has been she has not been getting her calcium channel blockers since      her discharge, secondary to pharmacy error.  2. Associated diastolic congestive heart failure, probably exacerbated      secondary to her atrial fibrillation with a rapid ventricular rate.      Our plan is an atypical appearing chest x-ray +/- edema with pulmonary      consult compromise.  3. Slight renal insufficiency.  It is noted that her BUN and creatinine on      October 4 and October 7 were 23 and 1.2 and 16 and 1.1 respectively.      History per past medical history.   DISPOSITION:  Dr. Daleen Squibb reviewed the patient's history, spoke with and  examined the patient and agrees with the above.  Here in the emergency room,  we will give her IV Lopressor 5 mg q.5 minutes x3.  We will continue her  home  medications and resume diltiazem.  We will also diurese her and follow  her renal function.  We will place her on heparin until INR is greater than  2.0.  I will also check a urinalysis.      Joellyn Rued, PA-C      Jesse Sans. Daleen Squibb, MD, [facility]  Electronically Signed    EW/MEDQ  D:  03/05/2006  T:  03/05/2006  Job:  [id number]   cc:   Ellin Saba., MD  [name], MD, [facility]  [name], MD, [facility]  Rose Phi. Myna Hidalgo, M.D.

## 2010-09-26 NOTE — H&P (Signed)
[name], [name]                ACCOUNT NO.:  [id number]   MEDICAL RECORD NO.:  [id number]          PATIENT TYPE:  INP   LOCATION:  3735                         FACILITY:  [facility]   PHYSICIAN:  Gerrit Friends. Dietrich Pates, MD, FACCDATE OF BIRTH:  June 28, 1931   DATE OF ADMISSION:  02/11/2006  DATE OF DISCHARGE:                                HISTORY & PHYSICAL   REFERRING:  Theron Arista R. Myna Hidalgo, M.D.   PRIMARY CARE PHYSICIAN:  Tawny Asal, MD.   PRIMARY CARDIOLOGIST:  Bea Laura Graceann Congress, MD, [facility]   HISTORY OF PRESENT ILLNESS:  A 77 year old woman with multiple medical  problems referred for newly noted atrial fibrillation.  [name] cardiac  history dates to at least 2004 when cardiac catheterization revealed  moderate three-vessel coronary disease with the most notable lesions 95%  stenosis in the distal PDA and a 90% first diagonal lesion.  Her anatomy was  not thought favorable for intervention, particularly due to marked  calcification, prompting medical therapy which has been successful to date.  She has hypertension and hyperlipidemia that have been well treated.  She  also has type 2 diabetes.  She has not used tobacco products.   She was evaluated by Dr. Corinda Gubler in office approximately 3 weeks ago at  which time she appeared to be doing well.  She was subsequently seen by Dr.  Scotty Court, and it was mentioned that her heart rate was rapid, but no EKG was  performed.  She was seen by Dr. Myna Hidalgo today where EKG demonstrated atrial  fibrillation with a fairly rapid ventricular response prompting referral to  the emergency department.  [name] notes no palpitations.  She has had no  dyspnea.  She has had back pain in the interscapular region, but this  predated the apparent onset of atrial fibrillation, which was within the  past 2 weeks.  A variety of other problems is described by her family  including intermittent confusion, a sudden loss of vision, lasting for a  second or  two, weakness, malaise and unsteady gait.  She was in the hospital  approximately a month ago for a urinary tract infection, which appeared to  respond well to antibiotics.  Additional issues found at that time were iron-  deficiency anemia and bilateral pleural effusions.  Right-sided  thoracentesis removed a liter of fluid but yielded no specific diagnosis.   Additional medical problems described in the chart include GERD, arthritis,  peripheral neuropathy and depression.  The patient nor her family recall  evaluation by neurologist in the past.   ALLERGIES:  The patient has no known allergies.   MEDICATIONS:  Medications include metoprolol 25 mg daily, diclofenac,  simvastatin 40 mg daily, Lexapro, AcipHex 20 mg daily, Provigil 100 mg  daily, Lyrica 75 mg daily, metformin 1000 mg b.i.d., Vesicare, Tricor 145 mg  daily and aspirin 81 mg daily.   SOCIAL HISTORY:  No history of excessive alcohol use; widowed with four  children.   FAMILY HISTORY:  Positive for coronary disease in both her mother and a  sibling.   REVIEW OF SYSTEMS:  Notable for generalized weakness, occasional mild  palpitations, intermittent dyspnea and dyspnea on exertion and cold  intolerance.  All other systems reviewed and are negative.   PHYSICAL EXAMINATION:  GENERAL:  On exam, pleasant, somewhat vague older  woman in no acute distress.  VITAL SIGNS:  The temperature is 96.7, heart rate 110 and irregular,  respirations 22, blood pressure 130/65, O2 saturation 95% on room air.  HEENT:  Anicteric sclerae; normal lids and conjunctiva; normal EOM's; normal  oral mucosa.  SKIN:  Pallor.  NECK:  No jugular venous distension; normal carotid upstrokes without  bruits.  ENDOCRINE:  No thyromegaly.  HEMATOPOIETIC:  No adenopathy.  LUNGS:  Dullness to percussion and decreased breath sounds at the right  base.  CARDIAC:  Irregular rhythm; normal first and second heart sounds; modest  systolic murmur.  Normal PMI.   ABDOMEN:  Soft and nontender; normal bowel sounds; no masses; no  organomegaly.  EXTREMITIES:  Trace edema; distal pulses intact.  NEUROMUSCULAR:  Symmetric strength and tone; normal cranial nerves.  MUSCULOSKELETAL:  No joint deformities.   EKG:  Atrial fibrillation with a rapid ventricular response; low voltage;  nonspecific ST-T-wave abnormality; delayed R-wave progression.   Other laboratory pending.   IMPRESSION:  [name] presents with multiple medical problems in recent  weeks.  Her atrial fibrillation appears to be unrelated and one of the  lesser issues that she faces at the present time.  Although the transient  visual disturbance she describes is of concern, it is not really suggestive  of a TIA.  Nonetheless, anticoagulation with Lovenox will be initiated with  plans to ultimately start warfarin.  Her dose of metoprolol will be  increased until heart rate is adequately controlled.  I doubt that  cardioversion will be warranted.  TSH is pending.  An echocardiogram will be  obtained.   She has undiagnosed pleural effusion..  Dr. Myna Hidalgo had planned to  consultation CVTS, presumably for a the laparoscopic pleural biopsy.  We  will proceed with this plan.   She has iron deficiency anemia, but is not currently being treated with  iron.  It is not clear that GI evaluation has been undertaken.   She has unsteadiness of gait and confusion.  She will require and neurology  assessment and likely an MRI of her brain.   Despite fairly severe coronary disease, she does not have symptoms  suggestive of ischemia at the present time.  Her usual antihypertensive and  lipid lowering agents will be continued.   Control of diabetes will be assessed.  It may be warranted to obtain  consultation from our primary care colleagues but this will be deferred for  the time being.      Gerrit Friends. Dietrich Pates, MD, [facility] Electronically Signed     RMR/MEDQ  D:  02/11/2006  T:  02/13/2006   Job:  [id number]

## 2010-09-26 NOTE — Assessment & Plan Note (Signed)
[facility]                            CARDIOLOGY OFFICE NOTE   [name], [name]                       MRN:          [id number]  DATE:05/28/2006                            DOB:          11/17/1931    [name] is a very pleasant 77 year old white female with coronary  artery disease, hyperlipidemia, diabetes, small cell lung cancer,  peripheral neuropathy, GERD, depression and recent paroxysmal atrial  fibrillation with tachybrady syndrome. In October, she underwent  implantation of a pacemaker per Dr. Graciela Husbands. She had some subcutaneous  stitch abscess for which she had incision I&D on November 13. She has  had no further problems. She generally feels well. She has occasional  shortness of breath. She was in normal sinus rhythm when seen most  recently on December 13. However, today she is in atrial fibrillation  with ventricular rate of 112. She was not aware of this. Interrogating  the pacemaker reveals that she has had several episodes of atrial fib  over the past 2 months. she is in atrial fib 17% of the time.   MEDICATIONS:  1. Lexapro 10.  2. Voltaren 75.  3. __________ .  4. Lyrica 75.  5. Tricor 145.  6. Zocor 40.  7. Metformin 1 gram.  8. AcipHex.  9. Vesicare.  10.Iron.  11.B12.  12.Coumadin.  13.Inderal LA 120.   PHYSICAL EXAMINATION:  VITAL SIGNS:  Blood pressure is 180/71, pulse  112, atrial fib.  GENERAL:  Normal.  NECK:  JVP is not elevated.  LUNGS:  Clear.  HEART:  Reveals no murmur or gallop. She has atrial fib.  EXTREMITIES:  Reveal no edema.   EKG reveals atrial fib, ventricular rate of 112.   We had planned to start her on digoxin 0.25 daily. Also I did not see  that she was on Inderal LA 120.   I think we will double the inderal. I will see her back in 2 weeks at  which time we will interrogate her again and see if her rate is better  controlled.     [name], MD, [facility]  Electronically  Signed    EJL/MedQ  DD: 05/28/2006  DT: 05/28/2006  Job #: [id number]

## 2010-09-26 NOTE — Op Note (Signed)
NAMEKAELEE, PFEFFER                ACCOUNT NO.:  [id number]   MEDICAL RECORD NO.:  [id number]          PATIENT TYPE:  INP   LOCATION:  3728                         FACILITY:  [facility]   PHYSICIAN:  Doylene Canning. Ladona Ridgel, MD    DATE OF BIRTH:  1932-03-21   DATE OF PROCEDURE:  03/23/2006  DATE OF DISCHARGE:                                 OPERATIVE REPORT   PROCEDURE PERFORMED:  Pacemaker incision I&D.   INDICATIONS:  Pacemaker infection, subcutaneous stitch abscess versus deep  tissue/pocket infection.   INTRODUCTION:  The patient is a 77 year old woman with a history of  symptomatic bradycardia who underwent permanent pacemaker insertion several  weeks ago.  She subsequently developed swelling and tenderness over her  pacemaker pocket.  She denies systemic illness symptoms like fevers or  chills.  She is now referred for pacemaker incision I&D.   PROCEDURE:  After informed consent was obtained, the patient was taken to  the diagnostic EP lab in the fasting state.  After the usual preparation and  draping, intravenous fentanyl and midazolam was given for sedation.  30 mL  of lidocaine was infiltrated over the old pacemaker pocket insertion site.  A 3-cm incision was carried out on the lateral margin of the pocket scar,  and a smaller 1-cm incision was carried out inferior to the pocket.  At both  sites there was evidence of active infection.  A very small amount of  purulent material was expressed on the lateral incision of the previous  pacemaker incision and the incision was inspected.  There was a minimal  amount of necrotic tissue.  Pressure was held and the pocket was irrigated  and the incision was left open to heal by secondary intention.   COMPLICATIONS:  There were no immediate procedure complications.   RESULTS:  This demonstrates what appears to be a deep stitch abscess  involving the first and second layers of the patient's previous pacemaker  insertion with no evidence of any  deep tissue infection at the present time.  The pocket did not appear to be compromised.  The incision will be allowed  to heal with secondary intention.      Doylene Canning. Ladona Ridgel, MD  Electronically Signed     GWT/MEDQ  D:  03/23/2006  T:  03/23/2006  Job:  [id number]   cc:   [name], MD, [facility]

## 2010-09-26 NOTE — Assessment & Plan Note (Signed)
[facility]                            CARDIOLOGY OFFICE NOTE   NIMRIT, KEHRES                       MRN:          [id number]  DATE:06/22/2006                            DOB:          January 29, 1932    HISTORY OF PRESENT ILLNESS:  The patient is a very pleasant 77 year old  white female with coronary artery disease, hyperlipidemia, diabetes,  small cell lung cancer, peripheral neuropathy, GERD, depression, and  proximal atrial fibrillation with tachy-brady syndrome. In October, she  underwent implantation of pacemaker by Dr. Graciela Husbands. She developed  subcutaneous stitch abscess, which has subsequently resolved after she  had I&D on March 23, 2006. The patient is generally feeling well.  Today, she is in normal sinus rhythm. However, the interrogation of the  pacer reveals that she had frequent periods of atrial fibrillation in  the last 2 weeks. When seen on May 28, 2006, we started her on  Digoxin 0.25 and Toprol XL 25 and discontinued her Inderal.   MEDICATIONS:  Lexapro 10, Voltaren 75, Provigil 100, Lyrica 75, Tricor  145, Metformin 1 gram b.i.d., Aciphex, Vesicare, Coumadin, Lasix 20,  Digoxin 0.25, Toprol XL 25, Simvastatin probably 40.   PHYSICAL EXAMINATION:  VITAL SIGNS:  Blood pressure 117/63, pulse 75 and  normal sinus rhythm.  GENERAL:  Normal appearance.  NECK:  JVP not elevated. Carotid pulses equal without bruits.  LUNGS:  Clear.  CARDIOVASCULAR:  No significant murmur.  EXTREMITIES:  Without edema.   LABORATORY DATA:  EKG reveals normal sinus rhythm, first degree AV  block.   IMPRESSION:  Old inferior myocardial infarction.   PLAN:  Because she is still in atrial fibrillation, I suggest that you  increase the Toprol XL to 50. Plan to see her back in 2 months and  interrogate her at that time. She appears to be having fewer and fewer  episodes of atrial fibrillation.     [name], MD, [facility]  Electronically  Signed    EJL/MedQ  DD: 06/22/2006  DT: 06/22/2006  Job #: [id number]

## 2010-09-26 NOTE — Discharge Summary (Signed)
NAME:  [name], [name]                          ACCOUNT NO.:  [id number]   MEDICAL RECORD NO.:  [id number]                   PATIENT TYPE:  INP   LOCATION:  4707                                 FACILITY:  [facility]   PHYSICIAN:  [name], M.D.             DATE OF BIRTH:  May 10, 1932   DATE OF ADMISSION:  01/31/2003  DATE OF DISCHARGE:  02/01/2003                           DISCHARGE SUMMARY - REFERRING   HISTORY OF PRESENT ILLNESS:  This 77 year old female with a seven to eight-  year-history of diabetes, who was seen in the office by Dr. [name] on January 31, 2003, secondary to exertional chest pain which was  becoming progressively worse.  The decision was made to admit the patient  for further evaluation.   PAST MEDICAL HISTORY:  1. Significant for diabetes mellitus.  2. Pneumonia.  3. Back pain.  4. The patient was hospitalized in the past for kidney surgery.  5. Hospitalized in the past for a benign growth.  6. Hospitalized in the past for gallbladder surgery.   ALLERGIES:  No known drug allergies.   MEDICATIONS PRIOR TO ADMISSION:  1. Metformin.  2. Premarin.  3. Vioxx.   FAMILY HISTORY:  The patient's father died from coronary artery disease at a  young age.  Her mother died from a myocardial infarction at age 73.  She has  one brother who died suddenly at age 37.  A sister died from a myocardial  infarction and cancer.   SOCIAL HISTORY:  The patient works at [employer] as a Holiday representative.  She does not  exercise.  She is married and has four children.  She smoked heavily until  25 years ago.  She does not drink alcohol or use drugs.   HOSPITAL COURSE:  As noted, this patient was admitted to [facility] for further evaluation of chest pain.  She was scheduled  for a cardiac catheterization.  Her potassium is noted to be low.  This was  supplemented and improved.  The patient underwent a cardiac catheterization on February 01, 2003,  performed by Dr. Salvadore Farber.  She was found to have diffuse disease.  Please see his dictated report for full details.  It was felt that the  culprit lesion was an ostial diagonal; however, it was heavily calcified,  and it was a small-sized vessel, approximately 2.0 mm.  It was not readily  amenable to a percutaneous intervention.  Dr. Samule Ohm recommended medical  therapy.  If the patient continues to have symptoms, he would consider a  percutaneous intervention, versus coronary artery bypass graft surgery.  The patient's medications were adjusted and arrangements were made to  discharge her later that same day, in an improved and stable condition.   LABORATORY DATA:  A CBC on January 31, 2003, revealed a hemoglobin of  12.2, hematocrit 34.9, WBC 6,800, platelets 207,000.  Chemistry  profile on  February 01, 2003, revealed a BUN of 16, creatinine 0.8, potassium 4.1,  glucose 118.  On admission the potassium had been 3.1.  The cardiac enzymes  were negative.  A chest x-ray showed marked right peritracheal fullness which could possibly  be due to tortuous vessels; however, it was recommended that the patient  have correlation with prior chest x-rays.  If none available, a chest CT was  recommended for further evaluation.  There were no acute pulmonary findings.  There was a calcified granuloma in the left upper lobe.   DISCHARGE MEDICATIONS:  1. Toprol XL 50 mg daily.  2. Metformin 500 mg q.a.m., 1000 mg q. p.m., to be restarted several days     after her discharge from the hospital.  3. Premarin as previously taken.  4. Vioxx as previously taken.  5. Coated aspirin 325 mg daily.  6. Nitroglycerin p.r.n. chest pain.  The patient was given instructions on     how to use nitroglycerin.  7. She is to be on Imdur 30 mg daily.  8. Lipitor 40 mg q. Bedtime.  9. Altace 2.5 mg daily.  10.      Tylenol p.r.n. pain.   DISCHARGE INSTRUCTIONS:  The patient was told to avoid any  strenuous  activities.  She is not to drive for two days.  She was told not to lift  more than 10 pounds, for one week.  She was told that she can return to work  on the following Wednesday.  She is told to call the office if she has any  increased pain, swelling, or bleeding from her groin.   DIET:  She is to be on a low-fat, low-salt, diabetic diet.   FOLLOW UP:  She is to have a fasting lipid profile, as well as a liver  profile and basic metabolic panel on February 14, 2003, at 8:30 a.m. at the  [facility] on [address].  She is to follow up with Dr. [name] on Thursday, February 15, 2003, at 11:15 a.m., and with  Dr. Tawny Asal as scheduled.   DISCHARGE PROBLEM LIST:  1. Chest pain, myocardial infarction ruled out.  2. Cardiac catheterization performed on February 01, 2003, revealing     diffuse disease as noted above.  Medical therapy recommended at this     time.  3. ABNORMAL CHEST X-RAY - FOLLOW-UP CT RECOMMENDED.  4. Diabetes mellitus.  5. Hypertension.  6. History of renal surgery.  7. Status post cholecystectomy.  8. History of pneumonia.  9. History of low back pain, probably osteoarthritis.    ADDENDUM:  As noted, the patient will need an outpatient CT scan.  I will  discuss this with the patient.  Hopefully this can be arranged during her  next office visit.       Delton See, P.A. [facility]                  E. Graceann Congress, M.D.    DR/MEDQ  D:  02/01/2003  T:  02/02/2003  Job:  [id number]   cc:   Dianna Limbo, M.D.

## 2010-10-23 ENCOUNTER — Ambulatory Visit (INDEPENDENT_AMBULATORY_CARE_PROVIDER_SITE_OTHER): Payer: Medicare Other | Admitting: *Deleted

## 2010-10-23 DIAGNOSIS — I495 Sick sinus syndrome: Secondary | ICD-10-CM

## 2010-10-23 DIAGNOSIS — I4891 Unspecified atrial fibrillation: Secondary | ICD-10-CM

## 2010-10-27 NOTE — Progress Notes (Signed)
Pacer remote check  

## 2010-11-19 ENCOUNTER — Encounter: Payer: Self-pay | Admitting: *Deleted

## 2011-01-29 ENCOUNTER — Encounter: Payer: Medicare Other | Admitting: *Deleted

## 2011-02-01 ENCOUNTER — Encounter: Payer: Self-pay | Admitting: *Deleted

## 2011-02-11 LAB — BASIC METABOLIC PANEL
Calcium: 9.4
GFR calc Af Amer: 55 — ABNORMAL LOW
GFR calc non Af Amer: 46 — ABNORMAL LOW
Potassium: 4.6
Sodium: 138

## 2011-02-11 LAB — GLUCOSE, CAPILLARY
Glucose-Capillary: 91
Glucose-Capillary: 93

## 2011-02-11 LAB — CBC
HCT: 33.8 — ABNORMAL LOW
Hemoglobin: 11.7 — ABNORMAL LOW
WBC: 7.4

## 2011-02-11 LAB — PROTIME-INR: INR: 1.1

## 2011-05-28 ENCOUNTER — Encounter: Payer: Self-pay | Admitting: Internal Medicine

## 2011-06-23 ENCOUNTER — Telehealth: Payer: Self-pay | Admitting: Internal Medicine

## 2011-06-23 NOTE — Telephone Encounter (Signed)
05-28-11 sent past due letter/mt 06-23-11 pt made appt for 07-14-11/mt

## 2011-07-10 ENCOUNTER — Encounter: Payer: Self-pay | Admitting: *Deleted

## 2011-07-12 ENCOUNTER — Emergency Department (HOSPITAL_COMMUNITY): Payer: Medicare Other

## 2011-07-12 ENCOUNTER — Encounter (HOSPITAL_COMMUNITY): Payer: Self-pay | Admitting: Emergency Medicine

## 2011-07-12 ENCOUNTER — Other Ambulatory Visit: Payer: Self-pay

## 2011-07-12 ENCOUNTER — Emergency Department (HOSPITAL_COMMUNITY)
Admission: EM | Admit: 2011-07-12 | Discharge: 2011-07-12 | Disposition: A | Discharge status: Home / Self Care | Payer: Medicare Other | Attending: Emergency Medicine | Admitting: Emergency Medicine

## 2011-07-12 DIAGNOSIS — N390 Urinary tract infection, site not specified: Secondary | ICD-10-CM

## 2011-07-12 DIAGNOSIS — R05 Cough: Secondary | ICD-10-CM | POA: Insufficient documentation

## 2011-07-12 DIAGNOSIS — I440 Atrioventricular block, first degree: Secondary | ICD-10-CM | POA: Insufficient documentation

## 2011-07-12 DIAGNOSIS — R5381 Other malaise: Secondary | ICD-10-CM | POA: Insufficient documentation

## 2011-07-12 DIAGNOSIS — R51 Headache: Secondary | ICD-10-CM | POA: Insufficient documentation

## 2011-07-12 DIAGNOSIS — R4182 Altered mental status, unspecified: Secondary | ICD-10-CM | POA: Insufficient documentation

## 2011-07-12 DIAGNOSIS — E119 Type 2 diabetes mellitus without complications: Secondary | ICD-10-CM | POA: Insufficient documentation

## 2011-07-12 DIAGNOSIS — R059 Cough, unspecified: Secondary | ICD-10-CM | POA: Insufficient documentation

## 2011-07-12 DIAGNOSIS — Z950 Presence of cardiac pacemaker: Secondary | ICD-10-CM | POA: Insufficient documentation

## 2011-07-12 DIAGNOSIS — R509 Fever, unspecified: Secondary | ICD-10-CM | POA: Insufficient documentation

## 2011-07-12 DIAGNOSIS — Z85118 Personal history of other malignant neoplasm of bronchus and lung: Secondary | ICD-10-CM | POA: Insufficient documentation

## 2011-07-12 LAB — DIFFERENTIAL
Lymphocytes Relative: 7 % — ABNORMAL LOW (ref 12–46)
Monocytes Absolute: 0.7 10*3/uL (ref 0.1–1.0)
Monocytes Relative: 6 % (ref 3–12)
Neutro Abs: 10 10*3/uL — ABNORMAL HIGH (ref 1.7–7.7)

## 2011-07-12 LAB — CBC
HCT: 35.8 % — ABNORMAL LOW (ref 36.0–46.0)
Hemoglobin: 12.3 g/dL (ref 12.0–15.0)
MCHC: 34.4 g/dL (ref 30.0–36.0)
WBC: 13 10*3/uL — ABNORMAL HIGH (ref 4.0–10.5)

## 2011-07-12 LAB — URINALYSIS, ROUTINE W REFLEX MICROSCOPIC
Bilirubin Urine: NEGATIVE
Nitrite: NEGATIVE
Protein, ur: NEGATIVE mg/dL
Specific Gravity, Urine: 1.014 (ref 1.005–1.030)
Urobilinogen, UA: 0.2 mg/dL (ref 0.0–1.0)

## 2011-07-12 LAB — COMPREHENSIVE METABOLIC PANEL
BUN: 24 mg/dL — ABNORMAL HIGH (ref 6–23)
CO2: 24 mEq/L (ref 19–32)
Chloride: 98 mEq/L (ref 96–112)
Creatinine, Ser: 1.27 mg/dL — ABNORMAL HIGH (ref 0.50–1.10)
GFR calc non Af Amer: 40 mL/min — ABNORMAL LOW (ref >90)
Glucose, Bld: 116 mg/dL — ABNORMAL HIGH (ref 70–99)
Total Bilirubin: 0.4 mg/dL (ref 0.3–1.2)

## 2011-07-12 LAB — LIPASE, BLOOD: Lipase: 19 U/L (ref 11–59)

## 2011-07-12 LAB — POCT I-STAT, CHEM 8
Hemoglobin: 12.2 g/dL (ref 12.0–15.0)
Sodium: 136 mEq/L (ref 135–145)
TCO2: 25 mmol/L (ref 0–100)

## 2011-07-12 LAB — URINE MICROSCOPIC-ADD ON

## 2011-07-12 MED ORDER — DEXTROSE 5 % IV SOLN
1.0000 g | Freq: Once | INTRAVENOUS | Status: AC
  Administered 2011-07-12: 1 g via INTRAVENOUS
  Filled 2011-07-12: qty 10

## 2011-07-12 MED ORDER — CEPHALEXIN 500 MG PO CAPS: 500.0000 mg | ORAL_CAPSULE | Freq: Four times a day (QID) | ORAL | Status: AC

## 2011-07-12 NOTE — ED Provider Notes (Signed)
History     CSN: [id number]  Arrival date & time 07/12/11  1352 2:55 PM HPI Patient reports feeling weak but denies having any pain. Was sent for an assisted living facility to weakness and fever. Patient states she's a mild cough denies urinary symptoms, headache, chest pain, shortness of breath, diarrhea, abdominal pain, nausea, vomiting. Patient is a 78 y.o. female presenting with weakness. The history is provided by the patient, the nursing home and a relative.  Weakness The primary symptoms include headaches and fever. Primary symptoms do not include syncope, loss of consciousness, altered mental status, dizziness, paresthesias, nausea or vomiting. The symptoms began 3 to 5 days ago. The symptoms are worsening. The neurological symptoms are diffuse.  The headache is associated with weakness. The headache is not associated with neck stiffness or paresthesias.  The fever began 3 to 5 days ago. The fever has been unchanged since its onset.  Additional symptoms include weakness. Additional symptoms do not include neck stiffness or pain.    Past Medical History  Diagnosis Date  . Pacemaker     MDT-DDD  . Sick sinus syndrome     tachy-brady  . Atrial fibrillation   . Arthritis   . CAD (coronary artery disease)   . DM (diabetes mellitus)     type II  . Diverticulitis   . Depression     Past Surgical History  Procedure Date  . Kidney tumor   . Lung cancer surgery   . Breast biopsy   . Pacemaker insertion ~  . Cholecystectomy     Family History  Problem Relation Age of Onset  . Diabetes    . Breast cancer    . Alcohol abuse      ADDICTION    History  Substance Use Topics  . Smoking status: Former Games developer  . Smokeless tobacco: Not on file  . Alcohol Use: No    OB History    Grav Para Term Preterm Abortions TAB SAB Ect Mult Living                  Review of Systems  Constitutional: Positive for fever.  HENT: Negative for ear pain, sore throat, rhinorrhea, neck  pain and neck stiffness.   Respiratory: Positive for cough.   Cardiovascular: Negative for syncope.  Gastrointestinal: Negative for nausea, vomiting, abdominal pain, diarrhea and blood in stool.  Genitourinary: Negative for dysuria, urgency, hematuria, flank pain and vaginal bleeding.  Musculoskeletal: Negative for myalgias and back pain.  Neurological: Positive for weakness and headaches. Negative for dizziness, loss of consciousness, numbness and paresthesias.  Psychiatric/Behavioral: Negative for altered mental status.  All other systems reviewed and are negative.    Allergies  Review of patient's allergies indicates no known allergies.  Home Medications   Current Outpatient Rx  Name Route Sig Dispense Refill  . ACETAMINOPHEN 325 MG PO TABS Oral Take 650 mg by mouth every 4 (four) hours as needed. FOR PAIN    . ALBUTEROL SULFATE HFA 108 (90 BASE) MCG/ACT IN AERS Inhalation Inhale 1 puff into the lungs 4 (four) times daily.    Marland Kitchen CALCIUM CITRATE-VITAMIN D 200-200 MG-UNIT PO TABS Oral Take 2 tablets by mouth daily.    Marland Kitchen CARVEDILOL 12.5 MG PO TABS Oral Take 20.75 mg by mouth 2 (two) times daily with a meal. TAKE 1.5 TABLETS (20.75 MG) TWICE DAILY    . CITALOPRAM HYDROBROMIDE 20 MG PO TABS Oral Take 20 mg by mouth at bedtime.     Marland Kitchen  ESTRADIOL 10 MCG VA TABS Vaginal Place 1 tablet vaginally once a week. GIVEN ON WEDNESDAYS    . FERROUS SULFATE 325 (65 FE) MG PO TABS Oral Take 325 mg by mouth daily with breakfast.    . LIDOCAINE 5 % EX OINT Topical Apply 1 application topically 3 (three) times daily. FOR KNEE PAIN    . LOVASTATIN 20 MG PO TABS Oral Take 20 mg by mouth at bedtime.    Marland Kitchen METFORMIN HCL 500 MG PO TABS Oral Take 250 mg by mouth 2 (two) times daily with a meal.     . NIACIN ER (ANTIHYPERLIPIDEMIC) 500 MG PO TBCR Oral Take 500 mg by mouth at bedtime.    . OXYBUTYNIN CHLORIDE 5 MG PO TABS Oral Take 5-10 mg by mouth 2 (two) times daily. TAKE 1 TABLET IN THE MORNING AND 2 TABLETS AT  BEDTIME    . OXYCODONE HCL 5 MG PO TABS Oral Take 5 mg by mouth at bedtime.    Marland Kitchen PANTOPRAZOLE SODIUM 40 MG PO TBEC Oral Take 40 mg by mouth every morning.    Marland Kitchen POLYETHYLENE GLYCOL 3350 PO PACK Oral Take 17 g by mouth every morning.    . TRAZODONE HCL 50 MG PO TABS Oral Take 75 mg by mouth at bedtime.     Marland Kitchen VITAMIN D 400 UNITS PO TABS Oral Take 800 Units by mouth daily.      BP 99/47  Pulse 86  Temp 98.5 F (36.9 C)  Resp 17  SpO2 95%  Physical Exam  Vitals reviewed. Constitutional: She is oriented to person, place, and time. Vital signs are normal. She appears well-developed and well-nourished.  HENT:  Head: Normocephalic and atraumatic.  Mouth/Throat: Uvula is midline and oropharynx is clear and moist. Mucous membranes are dry.  Eyes: Conjunctivae and EOM are normal. Pupils are equal, round, and reactive to light.  Neck: Normal range of motion. Neck supple.  Cardiovascular: Normal rate, regular rhythm and normal heart sounds.  Exam reveals no friction rub.   No murmur heard. Pulmonary/Chest: Effort normal and breath sounds normal. She has no wheezes. She has no rhonchi. She has no rales. She exhibits no tenderness.  Abdominal: Soft. Bowel sounds are normal. She exhibits no distension and no mass. There is no tenderness. There is no rebound and no guarding.  Musculoskeletal: Normal range of motion.  Neurological: She is alert and oriented to person, place, and time. No cranial nerve deficit. She exhibits normal muscle tone. Coordination normal.  Skin: Skin is warm and dry. No rash noted. No erythema. No pallor.    ED Course  Procedures  Results for orders placed during the hospital encounter of 07/12/11  URINALYSIS, ROUTINE W REFLEX MICROSCOPIC      Component Value Range   Color, Urine YELLOW  YELLOW    APPearance CLOUDY (*) CLEAR    Specific Gravity, Urine 1.014  1.005 - 1.030    pH 6.0  5.0 - 8.0    Glucose, UA NEGATIVE  NEGATIVE (mg/dL)   Hgb urine dipstick SMALL (*)  NEGATIVE    Bilirubin Urine NEGATIVE  NEGATIVE    Ketones, ur NEGATIVE  NEGATIVE (mg/dL)   Protein, ur NEGATIVE  NEGATIVE (mg/dL)   Urobilinogen, UA 0.2  0.0 - 1.0 (mg/dL)   Nitrite NEGATIVE  NEGATIVE    Leukocytes, UA LARGE (*) NEGATIVE   CBC      Component Value Range   WBC 13.0 (*) 4.0 - 10.5 (K/uL)   RBC 4.11  3.87 - 5.11 (MIL/uL)   Hemoglobin 12.3  12.0 - 15.0 (g/dL)   HCT 16.1 (*) 09.6 - 46.0 (%)   MCV 87.1  78.0 - 100.0 (fL)   MCH 29.9  26.0 - 34.0 (pg)   MCHC 34.4  30.0 - 36.0 (g/dL)   RDW 04.5  40.9 - 81.1 (%)   Platelets 131 (*) 150 - 400 (K/uL)  DIFFERENTIAL      Component Value Range   Neutrophils Relative 77  43 - 77 (%)   Neutro Abs 10.0 (*) 1.7 - 7.7 (K/uL)   Lymphocytes Relative 7 (*) 12 - 46 (%)   Lymphs Abs 0.9  0.7 - 4.0 (K/uL)   Monocytes Relative 6  3 - 12 (%)   Monocytes Absolute 0.7  0.1 - 1.0 (K/uL)   Eosinophils Relative 10 (*) 0 - 5 (%)   Eosinophils Absolute 1.3 (*) 0.0 - 0.7 (K/uL)   Basophils Relative 0  0 - 1 (%)   Basophils Absolute 0.0  0.0 - 0.1 (K/uL)  COMPREHENSIVE METABOLIC PANEL      Component Value Range   Sodium 133 (*) 135 - 145 (mEq/L)   Potassium 4.3  3.5 - 5.1 (mEq/L)   Chloride 98  96 - 112 (mEq/L)   CO2 24  19 - 32 (mEq/L)   Glucose, Bld 116 (*) 70 - 99 (mg/dL)   BUN 24 (*) 6 - 23 (mg/dL)   Creatinine, Ser 9.14 (*) 0.50 - 1.10 (mg/dL)   Calcium 9.2  8.4 - 78.2 (mg/dL)   Total Protein 6.8  6.0 - 8.3 (g/dL)   Albumin 3.5  3.5 - 5.2 (g/dL)   AST 13  0 - 37 (U/L)   ALT 6  0 - 35 (U/L)   Alkaline Phosphatase 95  39 - 117 (U/L)   Total Bilirubin 0.4  0.3 - 1.2 (mg/dL)   GFR calc non Af Amer 40 (*) >90 (mL/min)   GFR calc Af Amer 46 (*) >90 (mL/min)  LIPASE, BLOOD      Component Value Range   Lipase 19  11 - 59 (U/L)  POCT I-STAT, CHEM 8      Component Value Range   Sodium 136  135 - 145 (mEq/L)   Potassium 4.3  3.5 - 5.1 (mEq/L)   Chloride 105  96 - 112 (mEq/L)   BUN 25 (*) 6 - 23 (mg/dL)   Creatinine, Ser 9.56 (*) 0.50 -  1.10 (mg/dL)   Glucose, Bld 213 (*) 70 - 99 (mg/dL)   Calcium, Ion 0.86  5.78 - 1.32 (mmol/L)   TCO2 25  0 - 100 (mmol/L)   Hemoglobin 12.2  12.0 - 15.0 (g/dL)   HCT 46.9  62.9 - 52.8 (%)  URINE MICROSCOPIC-ADD ON      Component Value Range   Squamous Epithelial / LPF RARE  RARE    WBC, UA TOO NUMEROUS TO COUNT  <3 (WBC/hpf)   Bacteria, UA MANY (*) RARE    Dg Chest 2 View  07/12/2011  *RADIOLOGY REPORT*  Clinical Data: Fever.  Altered mental status.  History of lung cancer with chemotherapy and radiation.  Pacemaker.  History of smoking.  CHEST - 2 VIEW  Comparison: CT of the chest 08/01/2010, chest x-ray 06/19/2010  Findings: The patient has right-sided transvenous pacemaker with lead is to the right atrium, right ventricle.  Heart is mildly enlarged.  Aorta is tortuous.  Calcified mediastinal lymph nodes and calcified left upper lobe nodule are again identified.  Volume  loss at the right lung base appears stable.  Minimal patchy densities at the left lung base are favored to be atelectasis or scarring.  No focal consolidations or definite pleural effusions are identified.  The patient has had vertebral plasty in the upper thoracic spine.  An adjacent level shows complete loss of vertebral height.  IMPRESSION:  1.  Stable appearance of the chest. 2.  Volume loss on the right. 3.  Evidence for prior granulomatous disease.  Original Report Authenticated By: Patterson Hammersmith, M.D.   Ct Head Wo Contrast  07/12/2011  *RADIOLOGY REPORT*  Clinical Data: Pain and weakness the and fever  CT HEAD WITHOUT CONTRAST  Technique:  Contiguous axial images were obtained from the base of the skull through the vertex without contrast.  Comparison: Head CT 08/26/2009  Findings: Exam is motion degraded at the level of the posterior fossa. No acute intracranial hemorrhage.  No focal mass lesion.  No CT evidence of acute infarction.   No midline shift or mass effect. No hydrocephalus.  Basilar cisterns are patent.  There  is extensive periventricular white matter hypodensities.  There is generalized cortical atrophy.  These findings are unchanged from prior. Paranasal sinuses and mastoid air cells are clear.  Orbits are normal.  IMPRESSION:  1.  No acute intracranial findings. 2.  Atrophy and microvascular disease similar to prior.  Original Report Authenticated By: Genevive Bi, M.D.   ED ECG REPORT   Date: 07/12/2011  EKG Time: 6:54 PM  Rate: 87  Rhythm: Sinus Rhythm,  1st degree AV block, LBBB  Axis: left axis deviation   Intervals:first-degree A-V block   ST&T Change: none  Narrative Interpretation: no significant changes sine 06/19/10              MDM   5:01 PM Patient currently receiving 1 g IV Rocephin for urinary tract infection. Reports she feels fine. Patient has a history of low blood pressure. Likely will complete antibiotics and discharged home with antibiotics to have an assisted living facility. Patient agrees with plan.  6:46 PM Discussed plan with Dr. Jeraldine Loots who agrees with plan. Patient has completed IV infusion. Will send back to assisted-living facility with prescription for Keflex. I have also ordered a urine culture. She denies any signs of weakness or discomfort. Patient agrees with plan and is ready for discharge.     Thomasene Lot, PA-C 07/12/11 1858

## 2011-07-12 NOTE — ED Notes (Signed)
[name]-Daughter-[phone]  Call if have any questions

## 2011-07-12 NOTE — ED Notes (Signed)
Per EMS report, pt with 2-3 day weakness, decreased intake and fever.

## 2011-07-12 NOTE — ED Notes (Signed)
Per EMS, pt weak with decreased intake and fever x 3 days.

## 2011-07-12 NOTE — ED Notes (Signed)
BJY:[id number]<FA> Expected date:07/12/11<BR> Expected time: 1:42 PM<BR> Means of arrival:Ambulance<BR> Comments:<BR> M65. 78 yo f. From assisted living. Weak, fever. 10 mins

## 2011-07-12 NOTE — Discharge Instructions (Signed)
[name] has a urinary tract infection. I treated this today with IV antibiotics however I would like her to continue to take Keflex 4 times a day. Please give Tylenol for fever greater than 100.4 every 6 hours. Please return to the emergency department for worsening symptoms such as  confusion and/or weakness. Also return for failure of urinary tract infection to heal. I recommend having her urine rechecked in one week to ensure resolution of infection   Urinary Tract Infection Infections of the urinary tract can start in several places. A bladder infection (cystitis), a kidney infection (pyelonephritis), and a prostate infection (prostatitis) are different types of urinary tract infections (UTIs). They usually get better if treated with medicines (antibiotics) that kill germs. Take all the medicine until it is gone. You or your child may feel better in a few days, but TAKE ALL MEDICINE or the infection may not respond and may become more difficult to treat. HOME CARE INSTRUCTIONS   Drink enough water and fluids to keep the urine clear or pale yellow. Cranberry juice is especially recommended, in addition to large amounts of water.   Avoid caffeine, tea, and carbonated beverages. They tend to irritate the bladder.   Alcohol may irritate the prostate.   Only take over-the-counter or prescription medicines for pain, discomfort, or fever as directed by your caregiver.  To prevent further infections:  Empty the bladder often. Avoid holding urine for long periods of time.   After a bowel movement, women should cleanse from front to back. Use each tissue only once.   Empty the bladder before and after sexual intercourse.  FINDING OUT THE RESULTS OF YOUR TEST Not all test results are available during your visit. If your or your child's test results are not back during the visit, make an appointment with your caregiver to find out the results. Do not assume everything is normal if you have not heard  from your caregiver or the medical facility. It is important for you to follow up on all test results. SEEK MEDICAL CARE IF:   There is back pain.   Your baby is older than 3 months with a rectal temperature of 100.5 F (38.1 C) or higher for more than 1 day.   Your or your child's problems (symptoms) are no better in 3 days. Return sooner if you or your child is getting worse.  SEEK IMMEDIATE MEDICAL CARE IF:   There is severe back pain or lower abdominal pain.   You or your child develops chills.   You have a fever.   Your baby is older than 3 months with a rectal temperature of 102 F (38.9 C) or higher.   Your baby is 93 months old or younger with a rectal temperature of 100.4 F (38 C) or higher.   There is nausea or vomiting.   There is continued burning or discomfort with urination.  MAKE SURE YOU:   Understand these instructions.   Will watch your condition.   Will get help right away if you are not doing well or get worse.  Document Released: 02/04/2005 Document Revised: 04/16/2011 Document Reviewed: 09/09/2006 [facility] Patient Information 2012 [place], [place].

## 2011-07-13 NOTE — ED Provider Notes (Signed)
Medical screening examination/treatment/procedure(s) were conducted as a shared visit with non-physician practitioner(s) and myself.  I personally evaluated the patient during the encounter This elderly female nursing home resident presents with weakness.  On my exam she is in no distress, is pleasant, interactive, denies significant complaints.  Her vital signs are unremarkable.  The patient's labs notable for a urinary tract infection.  She received antibiotics and was discharged to her nursing facility.  Gerhard Munch, MD 07/13/11 [phone]

## 2011-07-14 ENCOUNTER — Encounter: Payer: Self-pay | Admitting: Internal Medicine

## 2011-07-14 ENCOUNTER — Ambulatory Visit (INDEPENDENT_AMBULATORY_CARE_PROVIDER_SITE_OTHER): Payer: Medicare Other | Admitting: Internal Medicine

## 2011-07-14 DIAGNOSIS — I4891 Unspecified atrial fibrillation: Secondary | ICD-10-CM

## 2011-07-14 DIAGNOSIS — R0602 Shortness of breath: Secondary | ICD-10-CM | POA: Insufficient documentation

## 2011-07-14 DIAGNOSIS — I495 Sick sinus syndrome: Secondary | ICD-10-CM

## 2011-07-14 DIAGNOSIS — Z950 Presence of cardiac pacemaker: Secondary | ICD-10-CM | POA: Insufficient documentation

## 2011-07-14 DIAGNOSIS — I2510 Atherosclerotic heart disease of native coronary artery without angina pectoris: Secondary | ICD-10-CM

## 2011-07-14 LAB — URINE CULTURE: Culture  Setup Time: 201303032142

## 2011-07-14 LAB — PACEMAKER DEVICE OBSERVATION
AL AMPLITUDE: 4 mv
BAMS-0001: 175 {beats}/min
BATTERY VOLTAGE: 2.76 V
RV LEAD AMPLITUDE: 15.68 mv
RV LEAD IMPEDENCE PM: 503 Ohm

## 2011-07-14 NOTE — Assessment & Plan Note (Signed)
The patient's device was interrogated.  The information was reviewed. No changes were made in the programming.    

## 2011-07-14 NOTE — Assessment & Plan Note (Signed)
Pacing in the atrium about 12%

## 2011-07-14 NOTE — Assessment & Plan Note (Signed)
She is shortness of breath. It's possible that it is cardiac. However, given the reports from the CT scan from 2012 I think pursuing that angle  first makes more sense.

## 2011-07-14 NOTE — Assessment & Plan Note (Signed)
Non-obstructive

## 2011-07-14 NOTE — Patient Instructions (Signed)

## 2011-07-14 NOTE — Progress Notes (Signed)
HPI  [name] is a 78 y.o. female is seen in followup for PAF with bradycardia and syncope requiring pacemaker ; she has diffuse moderately obstructive coronary disease with normal left ventricular function.    She has complaints of shortness of breath. These are unassociated with chest discomfort. Last echo was 2008 demonstrated normal left ventricular function.  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 No further evaluations of that were done as best as I can tell. She is seeing Dr. Myna Hidalgo in he past   Past Medical History  Diagnosis Date  . Pacemaker     MDT-DDD  . Sick sinus syndrome     tachy-brady  . Atrial fibrillation   . Arthritis   . CAD (coronary artery disease)   . DM (diabetes mellitus)     type II  . Diverticulitis   . Depression     Past Surgical History  Procedure Date  . Kidney tumor   . Lung cancer surgery   . Breast biopsy   . Pacemaker insertion ~  . Cholecystectomy     Current Outpatient Prescriptions  Medication Sig Dispense Refill  . acetaminophen (TYLENOL) 325 MG tablet Take 650 mg by mouth every 4 (four) hours as needed. FOR PAIN      . albuterol (PROVENTIL HFA;VENTOLIN HFA) 108 (90 BASE) MCG/ACT inhaler Inhale 1 puff into the lungs 4 (four) times daily.      . Calcium Citrate-Vitamin D 200-250 MG-UNIT TABS Take 2 tablets by mouth daily.      . carvedilol (COREG) 12.5 MG tablet Take 20.75 mg by mouth 2 (two) times daily with a meal. TAKE 1.5 TABLETS (20.75 MG) TWICE DAILY      . cephALEXin (KEFLEX) 500 MG capsule Take 1 capsule (500 mg total) by mouth 4 (four) times daily.  28 capsule  0  . citalopram (CELEXA) 20 MG tablet Take 20 mg by mouth at bedtime.       . Estradiol 10 MCG TABS Place 1 tablet vaginally once a week. GIVEN ON WEDNESDAYS      . ferrous sulfate 325 (65 FE) MG  tablet Take 325 mg by mouth daily with breakfast.      . lidocaine (XYLOCAINE) 5 % ointment Apply 1 application topically 3 (three) times daily. FOR KNEE PAIN      . lovastatin (MEVACOR) 20 MG tablet Take 20 mg by mouth at bedtime.      . metFORMIN (GLUCOPHAGE) 500 MG tablet Take 500 mg by mouth 2 (two) times daily with a meal.       . miconazole (NEOSPORIN AF) 2 % cream Apply 1 application topically daily.      . niacin (NIASPAN) 500 MG CR tablet Take 500 mg by mouth at bedtime.      Marland Kitchen oxybutynin (DITROPAN) 5 MG tablet Take 5 mg by mouth daily.       Marland Kitchen oxyCODONE (OXY IR/ROXICODONE) 5 MG immediate release tablet Take 5 mg by mouth at bedtime.      . pantoprazole (PROTONIX) 40 MG tablet Take 40 mg by mouth every morning.      . polyethylene glycol (MIRALAX / GLYCOLAX) packet Take 17 g by mouth every morning.      . traZODone (DESYREL) 50 MG tablet Take 50 mg by mouth. Taking 1.5mg  Daily      .  vitamin D, CHOLECALCIFEROL, 400 UNITS tablet Take 800 Units by mouth daily.        No Known Allergies  Review of Systems negative except from HPI and PMH  Physical Exam BP 105/59  Pulse 68  Ht 5\' 4"  (1.626 m)  Wt 133 lb 12.8 oz (60.691 kg)  BMI 22.97 kg/m2 Well developed and well nourished in no acute distress HENT normal Neck Supple JVP flat; carotids brisk and full Clear to ausculation Regular rate and rhythm, no murmurs gallops or rub Soft with active bowel sounds No clubbing cyanosis none Edema Alert and oriented, grossly normal motor and sensory function Skin Warm and Dry   Assessment and  Plan

## 2011-07-14 NOTE — Assessment & Plan Note (Signed)
No significant atrail fibrillation

## 2011-07-15 NOTE — ED Notes (Signed)
+   Urine Patient treated with keflex-sensitive to same-chart appended per protocol MD. 

## 2011-10-09 ENCOUNTER — Emergency Department (HOSPITAL_COMMUNITY)
Admission: EM | Admit: 2011-10-09 | Discharge: 2011-10-10 | Disposition: A | Discharge status: Home / Self Care | Payer: Medicare Other | Attending: Emergency Medicine | Admitting: Emergency Medicine

## 2011-10-09 ENCOUNTER — Encounter (HOSPITAL_COMMUNITY): Payer: Self-pay | Admitting: Emergency Medicine

## 2011-10-09 DIAGNOSIS — IMO0002 Reserved for concepts with insufficient information to code with codable children: Secondary | ICD-10-CM | POA: Insufficient documentation

## 2011-10-09 DIAGNOSIS — F329 Major depressive disorder, single episode, unspecified: Secondary | ICD-10-CM | POA: Insufficient documentation

## 2011-10-09 DIAGNOSIS — Y9210 Unspecified residential institution as the place of occurrence of the external cause: Secondary | ICD-10-CM | POA: Insufficient documentation

## 2011-10-09 DIAGNOSIS — Z79899 Other long term (current) drug therapy: Secondary | ICD-10-CM | POA: Insufficient documentation

## 2011-10-09 DIAGNOSIS — I4891 Unspecified atrial fibrillation: Secondary | ICD-10-CM | POA: Insufficient documentation

## 2011-10-09 DIAGNOSIS — E119 Type 2 diabetes mellitus without complications: Secondary | ICD-10-CM | POA: Insufficient documentation

## 2011-10-09 DIAGNOSIS — T07XXXA Unspecified multiple injuries, initial encounter: Secondary | ICD-10-CM

## 2011-10-09 DIAGNOSIS — F3289 Other specified depressive episodes: Secondary | ICD-10-CM | POA: Insufficient documentation

## 2011-10-09 DIAGNOSIS — W19XXXA Unspecified fall, initial encounter: Secondary | ICD-10-CM | POA: Insufficient documentation

## 2011-10-09 DIAGNOSIS — I2510 Atherosclerotic heart disease of native coronary artery without angina pectoris: Secondary | ICD-10-CM | POA: Insufficient documentation

## 2011-10-09 NOTE — ED Notes (Signed)
Pt presents to ED after a fall that occurred at nursing facility this evening. Pt did not hit head, denies LOC and is able to recall event. Pt has small abrasion on left elbow.

## 2011-10-09 NOTE — ED Provider Notes (Signed)
History     CSN: [id number]  Arrival date & time 10/09/11  2121   First MD Initiated Contact with Patient 10/09/11 2220      Chief Complaint  Patient presents with  . Fall  . Hypotension    (Consider location/radiation/quality/duration/timing/severity/associated sxs/prior treatment) HPI Comments: Patient had a mechanical fall in her room at the nursing home, catching herself with her left elbow, and right outstretched hand.  She now has an abrasion on her elbow and a small skin tear to the lateral right wrist.  Did not hit her head.  She says she sat down hard and her "butt." hurts  Patient is a 78 y.o. female presenting with fall. The history is provided by the patient.  Fall The accident occurred 1 to 2 hours ago. The fall occurred while walking. She fell from a height of 3 to 5 ft. She landed on carpet. There was no blood loss. The point of impact was the left elbow and right wrist. The pain is at a severity of 1/10. The pain is mild. She was not ambulatory at the scene. There was no entrapment after the fall. There was no drug use involved in the accident. There was no alcohol use involved in the accident. Pertinent negatives include no fever, no abdominal pain and no headaches.    Past Medical History  Diagnosis Date  . Pacemaker     MDT-DDD  . Sick sinus syndrome     tachy-brady  . Atrial fibrillation   . Arthritis   . CAD (coronary artery disease)   . DM (diabetes mellitus)     type II  . Diverticulitis   . Depression     Past Surgical History  Procedure Date  . Kidney tumor   . Lung cancer surgery   . Breast biopsy   . Pacemaker insertion ~  . Cholecystectomy     Family History  Problem Relation Age of Onset  . Diabetes    . Breast cancer    . Alcohol abuse      ADDICTION    History  Substance Use Topics  . Smoking status: Former Games developer  . Smokeless tobacco: Not on file  . Alcohol Use: No    OB History    Grav Para Term Preterm Abortions TAB SAB  Ect Mult Living                  Review of Systems  Constitutional: Negative for fever.  Respiratory: Negative for shortness of breath.   Cardiovascular: Negative for chest pain.  Gastrointestinal: Negative for abdominal pain.  Musculoskeletal: Negative for joint swelling.  Skin: Positive for wound.  Neurological: Negative for dizziness, weakness and headaches.    Allergies  Review of patient's allergies indicates no known allergies.  Home Medications   Current Outpatient Rx  Name Route Sig Dispense Refill  . ACETAMINOPHEN 325 MG PO TABS Oral Take 650 mg by mouth every 4 (four) hours as needed. FOR PAIN    . ALBUTEROL SULFATE HFA 108 (90 BASE) MCG/ACT IN AERS Inhalation Inhale 1 puff into the lungs 4 (four) times daily.    Marland Kitchen CALCIUM CITRATE-VITAMIN D 200-250 MG-UNIT PO TABS Oral Take 2 tablets by mouth daily.    Marland Kitchen CARVEDILOL 12.5 MG PO TABS Oral Take 20.75 mg by mouth 2 (two) times daily with a meal. TAKE 1.5 TABLETS (20.75 MG) TWICE DAILY    . CITALOPRAM HYDROBROMIDE 20 MG PO TABS Oral Take 20 mg by mouth at bedtime.     Marland Kitchen  ESTRADIOL 10 MCG VA TABS Vaginal Place 1 tablet vaginally once a week. GIVEN ON WEDNESDAYS    . FERROUS SULFATE 325 (65 FE) MG PO TABS Oral Take 325 mg by mouth daily with breakfast.    . LIDOCAINE 5 % EX OINT Topical Apply 1 application topically 3 (three) times daily. FOR KNEE PAIN    . LOVASTATIN 20 MG PO TABS Oral Take 20 mg by mouth at bedtime.    Marland Kitchen METFORMIN HCL 500 MG PO TABS Oral Take 500 mg by mouth 2 (two) times daily with a meal.     . NIACIN ER (ANTIHYPERLIPIDEMIC) 500 MG PO TBCR Oral Take 500 mg by mouth at bedtime.    . OXYBUTYNIN CHLORIDE 5 MG PO TABS Oral Take 5 mg by mouth daily.     . OXYCODONE HCL 5 MG PO TABS Oral Take 5 mg by mouth at bedtime.    Marland Kitchen PANTOPRAZOLE SODIUM 40 MG PO TBEC Oral Take 40 mg by mouth every morning.    Marland Kitchen POLYETHYLENE GLYCOL 3350 PO PACK Oral Take 17 g by mouth every morning.    . TRAZODONE HCL 50 MG PO TABS Oral Take  50 mg by mouth. Taking 1.5mg  Daily    . VITAMIN D 400 UNITS PO TABS Oral Take 800 Units by mouth daily.      BP 119/98  Pulse 67  Temp(Src) 98.7 F (37.1 C) (Oral)  Resp 15  SpO2 99%  Physical Exam  Constitutional: She appears well-developed and well-nourished.  HENT:  Head: Normocephalic.  Eyes: Pupils are equal, round, and reactive to light.  Cardiovascular: Normal rate.   Pulmonary/Chest: Effort normal.  Abdominal: Soft.  Musculoskeletal: Normal range of motion. She exhibits no edema and no tenderness.  Neurological: She is alert.  Skin: Skin is warm.       Full range of motion of right wrist.  Refill less than 3 seconds, strong pulses, small skin tear on the lateral aspect of the wrist Superficial skin tear, posterior left elbow    ED Course  Procedures (including critical care time)  Labs Reviewed  URINALYSIS, ROUTINE W REFLEX MICROSCOPIC - Abnormal; Notable for the following:    Bilirubin Urine SMALL (*)    Ketones, ur TRACE (*)    Leukocytes, UA TRACE (*)    All other components within normal limits  POCT I-STAT, CHEM 8 - Abnormal; Notable for the following:    BUN 26 (*)    Creatinine, Ser 1.30 (*)    Glucose, Bld 104 (*)    Hemoglobin 10.2 (*)    HCT 30.0 (*)    All other components within normal limits  URINE MICROSCOPIC-ADD ON   No results found.   1. Fall   2. Abrasions of multiple sites       MDM   Mechanical fall.  We'll ambulate patient in the hallway.  She was not in the chart, after the event, per the instructions of the staff at the nursing him and would not allow her to stand or walk. They  immediately called EMS for transport        Arman Filter, NP 10/10/11 [phone]

## 2011-10-09 NOTE — ED Notes (Signed)
[id number]<JY> Expected date:<BR> Expected time:<BR> Means of arrival:<BR> Comments:<BR> EMS 10 GC, 78 yof fall w no complaints

## 2011-10-09 NOTE — ED Notes (Signed)
Per EMS: pt from nursing home,SNF, per staff pt was walking and lost her balance,denies injuring the head,nonotedhemorage or bruseing at this time, no deformity, noted abrasion to the left elbow and right wrist. Pt presented with hypotension.

## 2011-10-09 NOTE — ED Notes (Signed)
Patient ambulated well in hall with minimal assist.

## 2011-10-10 LAB — URINE MICROSCOPIC-ADD ON

## 2011-10-10 LAB — POCT I-STAT, CHEM 8
Chloride: 103 mEq/L (ref 96–112)
HCT: 30 % — ABNORMAL LOW (ref 36.0–46.0)
Hemoglobin: 10.2 g/dL — ABNORMAL LOW (ref 12.0–15.0)
Potassium: 4.7 mEq/L (ref 3.5–5.1)
Sodium: 139 mEq/L (ref 135–145)

## 2011-10-10 LAB — URINALYSIS, ROUTINE W REFLEX MICROSCOPIC
Glucose, UA: NEGATIVE mg/dL
Hgb urine dipstick: NEGATIVE
Specific Gravity, Urine: 1.024 (ref 1.005–1.030)
Urobilinogen, UA: 0.2 mg/dL (ref 0.0–1.0)
pH: 5.5 (ref 5.0–8.0)

## 2011-10-10 NOTE — Discharge Instructions (Signed)
Abrasions Abrasions are skin scrapes. Their treatment depends on how large and deep the abrasion is. Abrasions do not extend through all layers of the skin. A cut or lesion through all skin layers is called a laceration. HOME CARE INSTRUCTIONS   If you were given a dressing, change it at least once a day or as instructed by your caregiver. If the bandage sticks, soak it off with a solution of water or hydrogen peroxide.   Twice a day, wash the area with soap and water to remove all the cream/ointment. You may do this in a sink, under a tub faucet, or in a shower. Rinse off the soap and pat dry with a clean towel. Look for signs of infection (see below).   Reapply cream/ointment according to your caregiver's instruction. This will help prevent infection and keep the bandage from sticking. Telfa or gauze over the wound and under the dressing or wrap will also help keep the bandage from sticking.   If the bandage becomes wet, dirty, or develops a foul smell, change it as soon as possible.   Only take over-the-counter or prescription medicines for pain, discomfort, or fever as directed by your caregiver.  SEEK IMMEDIATE MEDICAL CARE IF:   Increasing pain in the wound.   Signs of infection develop: redness, swelling, surrounding area is tender to touch, or pus coming from the wound.   You have a fever.   Any foul smell coming from the wound or dressing.  Most skin wounds heal within ten days. Facial wounds heal faster. However, an infection may occur despite proper treatment. You should have the wound checked for signs of infection within 24 to 48 hours or sooner if problems arise. If you were not given a wound-check appointment, look closely at the wound yourself on the second day for early signs of infection listed above. MAKE SURE YOU:   Understand these instructions.   Will watch your condition.   Will get help right away if you are not doing well or get worse.  Document Released:  02/04/2005 Document Revised: 04/16/2011 Document Reviewed: 03/31/2011 [employer] Patient Information 2012 [employer]. 

## 2011-10-10 NOTE — ED Provider Notes (Signed)
Medical screening examination/treatment/procedure(s) were conducted as a shared visit with non-physician practitioner(s) and myself.  I personally evaluated the patient during the encounter  Will check urine and istat8--anticipate d/c  Toy Baker, MD 10/10/11 [phone]

## 2011-10-10 NOTE — ED Provider Notes (Signed)
Medical screening examination/treatment/procedure(s) were conducted as a shared visit with non-physician practitioner(s) and myself.  I personally evaluated the patient during the encounter  Toy Baker, MD 10/10/11 [phone]

## 2011-10-15 ENCOUNTER — Encounter: Payer: Medicare Other | Admitting: *Deleted

## 2011-10-26 ENCOUNTER — Encounter: Payer: Self-pay | Admitting: *Deleted

## 2011-11-27 ENCOUNTER — Ambulatory Visit: Payer: Medicare Other | Admitting: Hematology & Oncology

## 2011-11-27 ENCOUNTER — Other Ambulatory Visit: Payer: Medicare Other | Admitting: Lab

## 2011-12-30 ENCOUNTER — Ambulatory Visit (HOSPITAL_BASED_OUTPATIENT_CLINIC_OR_DEPARTMENT_OTHER): Payer: Medicare Other | Admitting: Hematology & Oncology

## 2011-12-30 ENCOUNTER — Ambulatory Visit (HOSPITAL_BASED_OUTPATIENT_CLINIC_OR_DEPARTMENT_OTHER): Payer: Medicare Other | Admitting: Lab

## 2011-12-30 VITALS — BP 111/60 | HR 70 | Temp 97.5°F | Resp 18 | Ht 64.0 in | Wt 125.0 lb

## 2011-12-30 DIAGNOSIS — Z85118 Personal history of other malignant neoplasm of bronchus and lung: Secondary | ICD-10-CM

## 2011-12-30 DIAGNOSIS — C3490 Malignant neoplasm of unspecified part of unspecified bronchus or lung: Secondary | ICD-10-CM

## 2011-12-30 LAB — CBC WITH DIFFERENTIAL (CANCER CENTER ONLY)
BASO#: 0 10*3/uL (ref 0.0–0.2)
EOS%: 3.8 % (ref 0.0–7.0)
HCT: 33.2 % — ABNORMAL LOW (ref 34.8–46.6)
HGB: 11.3 g/dL — ABNORMAL LOW (ref 11.6–15.9)
LYMPH#: 1.1 10*3/uL (ref 0.9–3.3)
MONO#: 0.5 10*3/uL (ref 0.1–0.9)
NEUT#: 5.4 10*3/uL (ref 1.5–6.5)
NEUT%: 73.7 % (ref 39.6–80.0)
RBC: 3.7 10*6/uL (ref 3.70–5.32)
WBC: 7.3 10*3/uL (ref 3.9–10.0)

## 2011-12-30 LAB — COMPREHENSIVE METABOLIC PANEL
ALT: 8 U/L (ref 0–35)
AST: 14 U/L (ref 0–37)
Albumin: 4.1 g/dL (ref 3.5–5.2)
BUN: 24 mg/dL — ABNORMAL HIGH (ref 6–23)
CO2: 25 mEq/L (ref 19–32)
Calcium: 9.3 mg/dL (ref 8.4–10.5)
Chloride: 103 mEq/L (ref 96–112)
Creatinine, Ser: 1.35 mg/dL — ABNORMAL HIGH (ref 0.50–1.10)
Potassium: 4.3 mEq/L (ref 3.5–5.3)

## 2011-12-30 LAB — LACTATE DEHYDROGENASE: LDH: 135 U/L (ref 94–250)

## 2011-12-30 NOTE — Progress Notes (Signed)
This office note has been dictated.

## 2011-12-31 NOTE — Progress Notes (Signed)
CC:   [facility], [place] [phone]  DIAGNOSIS:  Limited-stage small-cell lung cancer, remission.  CURRENT THERAPY:  Observation.  INTERIM HISTORY:  [name] comes in for a long-awaited followup.  We last saw her back in April 2012.  She is now out 8 years from treatment for her small-cell lung cancer.  She has other issues.  She has cardiac issues.  She has been falling. She is at a nursing home.  She wants to go home.  I told that I just did not think that this would really be a good idea for her to go home.  She just does not have the resources at home to be able to, I think be there safely.  Again, she has been admitted to the hospital on several occasions because of falling.  She is on quite a few medications.  She had a CT of the brain back in March.  Everything looked okay with no acute findings.  Her appetite comes and goes.  She has lost some weight since we last saw her.  She has had no nausea.  She has had some constipation.  There has been no leg swelling.  There have been no rashes.  She does have some neurological issues.  It appears that she has some kind of movement disorder.  She seems to roll her tongue quite a bit.  There are no issues with bleeding.  There is no cough.  PHYSICAL EXAMINATION:  General:  This is an elderly, thin white female in no obvious distress.  Vital Signs:  Show a temperature of 97.5, pulse 70, respiratory rate 18, blood pressure 111/60, weight is 125.  Head and Neck Exam:  Shows a normocephalic, atraumatic skull.  There are no ocular or oral lesions.  There are no palpable cervical or supraclavicular lymph nodes.  Lungs:  Relatively clear bilaterally. Cardiac Exam:  Regular rate and rhythm, with a normal S1 and S2.  She may have an occasional extra beat.  Abdominal Exam:  Soft with good bowel sounds.  There is no fluid wave.  There is no palpable abdominal mass.  There is no palpable hepatosplenomegaly.   Extremities:  Show no clubbing, cyanosis, or edema.  She has 4/5 strength in her arms and legs bilaterally.  Neurological Exam:  Does show the tongue movement.  She seems to have some increased movement of her head.  LABORATORY STUDIES:  White cell count is 7.3, hemoglobin 11.3, hematocrit 33.2, platelet count 130.  IMPRESSION:  [name] is a 78 year old female with a past history of limited-stage small-cell lung cancer.  Again, she is cured in my mind.  I do not see any evidence of any issues with other malignancies.  I think her cardiac issues probably will be the most significant thing that she has to deal with.  For now, we will plan to get her back in 6 months.  I think this would be reasonable.    ______________________________ Josph Macho, M.D. PRE/MEDQ  D:  12/30/2011  T:  12/31/2011  Job:  [id number]

## 2012-01-06 ENCOUNTER — Encounter: Payer: Self-pay | Admitting: *Deleted

## 2012-01-13 ENCOUNTER — Encounter: Payer: Self-pay | Admitting: Internal Medicine

## 2012-01-13 ENCOUNTER — Ambulatory Visit (INDEPENDENT_AMBULATORY_CARE_PROVIDER_SITE_OTHER): Payer: Medicare Other | Admitting: *Deleted

## 2012-01-13 DIAGNOSIS — I495 Sick sinus syndrome: Secondary | ICD-10-CM

## 2012-01-13 DIAGNOSIS — Z950 Presence of cardiac pacemaker: Secondary | ICD-10-CM

## 2012-01-14 ENCOUNTER — Telehealth: Payer: Self-pay | Admitting: Internal Medicine

## 2012-01-14 NOTE — Telephone Encounter (Signed)
Peggy nurse with pt's home care. Checking to see if we received transmission yesterday

## 2012-01-14 NOTE — Telephone Encounter (Signed)
Transmission received nurse aware and will call to schedule a December appointment with Dr. Graciela Husbands.

## 2012-01-15 LAB — REMOTE PACEMAKER DEVICE
AL IMPEDENCE PM: 327 Ohm
AL THRESHOLD: 0.75 V
BAMS-0001: 175 {beats}/min
RV LEAD AMPLITUDE: 16 mv
RV LEAD THRESHOLD: 0.75 V

## 2012-01-20 ENCOUNTER — Encounter: Payer: Self-pay | Admitting: *Deleted

## 2012-04-18 ENCOUNTER — Ambulatory Visit (INDEPENDENT_AMBULATORY_CARE_PROVIDER_SITE_OTHER): Payer: Medicare Other | Admitting: *Deleted

## 2012-04-18 DIAGNOSIS — I495 Sick sinus syndrome: Secondary | ICD-10-CM

## 2012-04-18 DIAGNOSIS — Z950 Presence of cardiac pacemaker: Secondary | ICD-10-CM

## 2012-04-27 LAB — REMOTE PACEMAKER DEVICE
AL AMPLITUDE: 2.8 mv
AL IMPEDENCE PM: 327 Ohm
BAMS-0001: 175 {beats}/min
BATTERY VOLTAGE: 2.75 V
VENTRICULAR PACING PM: 0

## 2012-05-12 ENCOUNTER — Encounter: Payer: Self-pay | Admitting: *Deleted

## 2012-05-19 ENCOUNTER — Encounter: Payer: Self-pay | Admitting: Internal Medicine

## 2012-06-28 ENCOUNTER — Telehealth: Payer: Self-pay | Admitting: Hematology & Oncology

## 2012-06-28 NOTE — Telephone Encounter (Signed)
Pt moved 2-19 to 2-21 said had to have Friday's and would see PA

## 2012-06-29 ENCOUNTER — Ambulatory Visit: Payer: Medicare Other | Admitting: Hematology & Oncology

## 2012-06-29 ENCOUNTER — Other Ambulatory Visit: Payer: Medicare Other | Admitting: Lab

## 2012-07-01 ENCOUNTER — Other Ambulatory Visit (HOSPITAL_BASED_OUTPATIENT_CLINIC_OR_DEPARTMENT_OTHER): Payer: Medicare Other | Admitting: Lab

## 2012-07-01 ENCOUNTER — Ambulatory Visit (HOSPITAL_BASED_OUTPATIENT_CLINIC_OR_DEPARTMENT_OTHER): Payer: Medicare Other | Admitting: Medical

## 2012-07-01 VITALS — BP 97/50 | HR 56 | Temp 97.7°F | Resp 16 | Ht 64.0 in | Wt 120.0 lb

## 2012-07-01 DIAGNOSIS — Z85118 Personal history of other malignant neoplasm of bronchus and lung: Secondary | ICD-10-CM

## 2012-07-01 DIAGNOSIS — C3490 Malignant neoplasm of unspecified part of unspecified bronchus or lung: Secondary | ICD-10-CM

## 2012-07-01 LAB — CBC WITH DIFFERENTIAL (CANCER CENTER ONLY)
BASO#: 0 10*3/uL (ref 0.0–0.2)
Eosinophils Absolute: 0.2 10*3/uL (ref 0.0–0.5)
HGB: 12.6 g/dL (ref 11.6–15.9)
LYMPH#: 1.1 10*3/uL (ref 0.9–3.3)
MCH: 30.5 pg (ref 26.0–34.0)
MONO%: 5.2 % (ref 0.0–13.0)
NEUT#: 6.4 10*3/uL (ref 1.5–6.5)
RBC: 4.13 10*6/uL (ref 3.70–5.32)

## 2012-07-01 MED ORDER — ESTRADIOL 10 MCG VA TABS: 10.0000 ug | ORAL_TABLET | VAGINAL | Status: DC

## 2012-07-01 NOTE — Progress Notes (Signed)
diagnosis Limited stage small cell lung cancer, remission.  Current therapy: Observation.  Interim history: [name] presents today for an office followup visit.  Overall, she, reports, that she's doing relatively well.  She remains in a nursing facility.  She states, that she wants to go home.  She's not reported any new problems.  She still continues with her cardiac issues.  She remains on quite a few medications.  She's not reported any breathing issues.  She is now out, a good 8 years from her small cell lung cancer.  She has a good appetite.  She denies any nausea, vomiting, diarrhea, constipation.  She denies any chest pain, shortness of breath, or cough.  She denies any fevers, chills, or night sweats.  She denies any obvious, or abnormal bleeding.  She denies any hemoptysis.  She denies any lower leg swelling.  She denies any headaches, visual changes, or rashes.  She denies any neurological issues.  Review of Systems: Constitutional:Negative for malaise/fatigue, fever, chills, weight loss, diaphoresis, activity change, appetite change, and unexpected weight change.  HEENT: Negative for double vision, blurred vision, visual loss, ear pain, tinnitus, congestion, rhinorrhea, epistaxis sore throat or sinus disease, oral pain/lesion, tongue soreness Respiratory: Negative for cough, chest tightness, shortness of breath, wheezing and stridor.  Cardiovascular: Negative for chest pain, palpitations, leg swelling, orthopnea, PND, DOE or claudication Gastrointestinal: Negative for nausea, vomiting, abdominal pain, diarrhea, constipation, blood in stool, melena, hematochezia, abdominal distention, anal bleeding, rectal pain, anorexia and hematemesis.  Genitourinary: Negative for dysuria, frequency, hematuria,  Musculoskeletal: Negative for myalgias, back pain, joint swelling, arthralgias and gait problem.  Skin: Negative for rash, color change, pallor and wound.  Neurological:. Negative for  dizziness/light-headedness, tremors, seizures, syncope, facial asymmetry, speech difficulty, weakness, numbness, headaches and paresthesias.  Hematological: Negative for adenopathy. Does not bruise/bleed easily.  Psychiatric/Behavioral:  Negative for depression, no loss of interest in normal activity or change in sleep pattern.   Physical Exam: This is a 79 year old, elderly, thin, white female, in no obvious distress Vitals: Temperature 97.7 degrees, pulse 56, respirations 16, blood pressure 97/50 weight 120 pounds HEENT reveals a normocephalic, atraumatic skull, no scleral icterus, no oral lesions  Neck is supple without any cervical or supraclavicular adenopathy.  Lungs are clear to auscultation bilaterally. There are no wheezes, rales or rhonci Cardiac is regular rate and rhythm with a normal S1 and S2. There are no murmurs, rubs, or bruits.  Abdomen is soft with good bowel sounds, there is no palpable mass. There is no palpable hepatosplenomegaly. There is no palpable fluid wave.  Musculoskeletal no tenderness of the spine, ribs, or hips.  Extremities there are no clubbing, cyanosis, or edema.  Skin no petechia, purpura or ecchymosis Neurologic is nonfocal.  Laboratory Data: White count 8.1, hemoglobin 12.6, hematocrit 37.4, platelets 133,000  Current Outpatient Prescriptions on File Prior to Visit  Medication Sig Dispense Refill  . acetaminophen (TYLENOL) 325 MG tablet Take 650 mg by mouth every 4 (four) hours as needed. FOR PAIN      . albuterol (PROVENTIL HFA;VENTOLIN HFA) 108 (90 BASE) MCG/ACT inhaler Inhale 1 puff into the lungs 4 (four) times daily.      . Calcium Citrate-Vitamin D (CITRACAL + D PO) Take 200 mg by mouth every morning. Take 2 tabs in AM      . carvedilol (COREG) 12.5 MG tablet Take 20.75 mg by mouth 2 (two) times daily with a meal. TAKE 1.5 TABLETS (20.75 MG) TWICE DAILY      .  citalopram (CELEXA) 20 MG tablet Take 20 mg by mouth at bedtime.       . Estradiol 10  MCG TABS Place 1 tablet vaginally once a week. GIVEN ON WEDNESDAYS      . ferrous sulfate 325 (65 FE) MG tablet Take 325 mg by mouth daily with breakfast.      . metFORMIN (GLUCOPHAGE) 500 MG tablet Take 500 mg by mouth 2 (two) times daily with a meal.       . niacin (NIASPAN) 500 MG CR tablet Take 500 mg by mouth at bedtime.      Marland Kitchen oxybutynin (DITROPAN) 5 MG tablet Take 5 mg by mouth daily.       Marland Kitchen oxyCODONE (OXY IR/ROXICODONE) 5 MG immediate release tablet Take 5 mg by mouth at bedtime.      . pantoprazole (PROTONIX) 40 MG tablet Take 40 mg by mouth every morning.      . polyethylene glycol (MIRALAX / GLYCOLAX) packet Take 17 g by mouth every morning.      . traZODone (DESYREL) 50 MG tablet Take 50 mg by mouth. Taking 1.5mg  Daily      . vitamin D, CHOLECALCIFEROL, 400 UNITS tablet Take 800 Units by mouth daily.       No current facility-administered medications on file prior to visit.   Assessment/Plan: This is a elderly, 79 year old, white female, with the following issues:  #1.  History of limited stage small cell lung cancer.  She still in remission.  There is no evidence of any issues with any other malignancies.  We will continue to monitor every 6 months.  #2.  Cardiac issues.  She remains on cardiac medications.  She will continue to followup with her cardiologist.  #3.  Followup.  We will follow back up with [name] in 6 months, but before then should there be questions or concerns.

## 2012-07-02 LAB — COMPREHENSIVE METABOLIC PANEL
ALT: <8 U/L (ref 0–35)
AST: 13 U/L (ref 0–37)
Creatinine, Ser: 1.06 mg/dL (ref 0.50–1.10)
Total Bilirubin: 0.4 mg/dL (ref 0.3–1.2)

## 2012-08-02 ENCOUNTER — Encounter: Payer: Medicare Other | Admitting: Internal Medicine

## 2012-08-08 ENCOUNTER — Telehealth: Payer: Self-pay | Admitting: Internal Medicine

## 2012-08-08 NOTE — Telephone Encounter (Signed)
New Prob   Calling to find out if transmission for pacemaker was received.

## 2012-08-08 NOTE — Telephone Encounter (Signed)
Spoke w/Rose in regards to transmission. Transmission was not received. Per Rose will resend transmission.

## 2012-08-23 ENCOUNTER — Telehealth: Payer: Self-pay | Admitting: Internal Medicine

## 2012-08-23 NOTE — Telephone Encounter (Signed)
New Problem:    Called in wanting to know if the patient's latest transmission was received.  Please call back.

## 2012-08-23 NOTE — Telephone Encounter (Signed)
Spoke w/Rose and instructed remote was received on 08-15-12. Rose would like phone call in regards to next transmission date.

## 2012-08-24 ENCOUNTER — Encounter: Payer: Medicare Other | Admitting: Internal Medicine

## 2012-08-29 ENCOUNTER — Emergency Department (HOSPITAL_COMMUNITY): Payer: Medicare Other

## 2012-08-29 ENCOUNTER — Encounter (HOSPITAL_COMMUNITY): Payer: Self-pay | Admitting: Emergency Medicine

## 2012-08-29 ENCOUNTER — Observation Stay (HOSPITAL_COMMUNITY)
Admission: EM | Admit: 2012-08-29 | Discharge: 2012-08-31 | Disposition: A | Discharge status: Skilled Nursing Facility | Payer: Medicare Other | Attending: Internal Medicine | Admitting: Internal Medicine

## 2012-08-29 DIAGNOSIS — S72109A Unspecified trochanteric fracture of unspecified femur, initial encounter for closed fracture: Principal | ICD-10-CM | POA: Insufficient documentation

## 2012-08-29 DIAGNOSIS — Y9210 Unspecified residential institution as the place of occurrence of the external cause: Secondary | ICD-10-CM | POA: Insufficient documentation

## 2012-08-29 DIAGNOSIS — E119 Type 2 diabetes mellitus without complications: Secondary | ICD-10-CM | POA: Insufficient documentation

## 2012-08-29 DIAGNOSIS — Z950 Presence of cardiac pacemaker: Secondary | ICD-10-CM | POA: Insufficient documentation

## 2012-08-29 DIAGNOSIS — F329 Major depressive disorder, single episode, unspecified: Secondary | ICD-10-CM | POA: Insufficient documentation

## 2012-08-29 DIAGNOSIS — S72112A Displaced fracture of greater trochanter of left femur, initial encounter for closed fracture: Secondary | ICD-10-CM

## 2012-08-29 DIAGNOSIS — W1811XA Fall from or off toilet without subsequent striking against object, initial encounter: Secondary | ICD-10-CM | POA: Insufficient documentation

## 2012-08-29 DIAGNOSIS — F3289 Other specified depressive episodes: Secondary | ICD-10-CM | POA: Insufficient documentation

## 2012-08-29 DIAGNOSIS — Z79899 Other long term (current) drug therapy: Secondary | ICD-10-CM | POA: Insufficient documentation

## 2012-08-29 DIAGNOSIS — N390 Urinary tract infection, site not specified: Secondary | ICD-10-CM | POA: Diagnosis present

## 2012-08-29 DIAGNOSIS — M899 Disorder of bone, unspecified: Secondary | ICD-10-CM | POA: Insufficient documentation

## 2012-08-29 DIAGNOSIS — I2510 Atherosclerotic heart disease of native coronary artery without angina pectoris: Secondary | ICD-10-CM

## 2012-08-29 DIAGNOSIS — M949 Disorder of cartilage, unspecified: Secondary | ICD-10-CM | POA: Insufficient documentation

## 2012-08-29 DIAGNOSIS — I4891 Unspecified atrial fibrillation: Secondary | ICD-10-CM | POA: Insufficient documentation

## 2012-08-29 DIAGNOSIS — I10 Essential (primary) hypertension: Secondary | ICD-10-CM | POA: Insufficient documentation

## 2012-08-29 LAB — URINALYSIS, ROUTINE W REFLEX MICROSCOPIC
Bilirubin Urine: NEGATIVE
Glucose, UA: NEGATIVE mg/dL
Hgb urine dipstick: NEGATIVE
Ketones, ur: NEGATIVE mg/dL
Nitrite: NEGATIVE
Protein, ur: NEGATIVE mg/dL
Specific Gravity, Urine: 1.018 (ref 1.005–1.030)
Urobilinogen, UA: 0.2 mg/dL (ref 0.0–1.0)
pH: 6 (ref 5.0–8.0)

## 2012-08-29 LAB — CBC WITH DIFFERENTIAL/PLATELET
Basophils Absolute: 0 10*3/uL (ref 0.0–0.1)
Basophils Relative: 0 % (ref 0–1)
Eosinophils Absolute: 0.2 10*3/uL (ref 0.0–0.7)
Eosinophils Relative: 3 % (ref 0–5)
HCT: 32.6 % — ABNORMAL LOW (ref 36.0–46.0)
Hemoglobin: 11 g/dL — ABNORMAL LOW (ref 12.0–15.0)
Lymphocytes Relative: 11 % — ABNORMAL LOW (ref 12–46)
Lymphs Abs: 0.8 10*3/uL (ref 0.7–4.0)
MCH: 29.6 pg (ref 26.0–34.0)
MCHC: 33.7 g/dL (ref 30.0–36.0)
MCV: 87.9 fL (ref 78.0–100.0)
Monocytes Absolute: 0.4 10*3/uL (ref 0.1–1.0)
Monocytes Relative: 6 % (ref 3–12)
Neutro Abs: 5.7 10*3/uL (ref 1.7–7.7)
Neutrophils Relative %: 80 % — ABNORMAL HIGH (ref 43–77)
Platelets: 165 10*3/uL (ref 150–400)
RBC: 3.71 MIL/uL — ABNORMAL LOW (ref 3.87–5.11)
RDW: 13.1 % (ref 11.5–15.5)
WBC: 7.2 10*3/uL (ref 4.0–10.5)

## 2012-08-29 LAB — URINE MICROSCOPIC-ADD ON

## 2012-08-29 LAB — RETICULOCYTES
RBC.: 3.48 MIL/uL — ABNORMAL LOW (ref 3.87–5.11)
Retic Count, Absolute: 59.2 10*3/uL (ref 19.0–186.0)

## 2012-08-29 LAB — BASIC METABOLIC PANEL
BUN: 23 mg/dL (ref 6–23)
CO2: 26 mEq/L (ref 19–32)
Calcium: 9.4 mg/dL (ref 8.4–10.5)
Chloride: 101 mEq/L (ref 96–112)
Creatinine, Ser: 0.9 mg/dL (ref 0.50–1.10)
GFR calc Af Amer: 69 mL/min — ABNORMAL LOW (ref >90)
GFR calc non Af Amer: 60 mL/min — ABNORMAL LOW (ref >90)
Glucose, Bld: 112 mg/dL — ABNORMAL HIGH (ref 70–99)
Potassium: 3.9 mEq/L (ref 3.5–5.1)
Sodium: 136 mEq/L (ref 135–145)

## 2012-08-29 MED ORDER — SIMVASTATIN 20 MG PO TABS
20.0000 mg | ORAL_TABLET | Freq: Every day | ORAL | Status: DC
  Administered 2012-08-29 – 2012-08-30 (×2): 20 mg via ORAL
  Filled 2012-08-29 (×3): qty 1

## 2012-08-29 MED ORDER — OXYBUTYNIN CHLORIDE 5 MG PO TABS
10.0000 mg | ORAL_TABLET | Freq: Every day | ORAL | Status: DC
  Administered 2012-08-29 – 2012-08-30 (×2): 10 mg via ORAL
  Filled 2012-08-29 (×3): qty 2

## 2012-08-29 MED ORDER — SODIUM CHLORIDE 0.9 % IJ SOLN: 10.0000 mL | INTRAMUSCULAR | Status: DC | PRN

## 2012-08-29 MED ORDER — KETOROLAC TROMETHAMINE 30 MG/ML IJ SOLN: 30.0000 mg | Freq: Once | INTRAMUSCULAR | Status: DC

## 2012-08-29 MED ORDER — TRAZODONE HCL 50 MG PO TABS
75.0000 mg | ORAL_TABLET | Freq: Every day | ORAL | Status: DC
  Administered 2012-08-29 – 2012-08-30 (×2): 75 mg via ORAL
  Filled 2012-08-29 (×3): qty 1

## 2012-08-29 MED ORDER — OXYCODONE HCL 5 MG PO TABS
5.0000 mg | ORAL_TABLET | Freq: Every day | ORAL | Status: DC
  Administered 2012-08-29 – 2012-08-30 (×2): 5 mg via ORAL
  Filled 2012-08-29 (×2): qty 1

## 2012-08-29 MED ORDER — ALBUTEROL SULFATE HFA 108 (90 BASE) MCG/ACT IN AERS
1.0000 | INHALATION_SPRAY | Freq: Four times a day (QID) | RESPIRATORY_TRACT | Status: DC
  Administered 2012-08-29 – 2012-08-31 (×7): 1 via RESPIRATORY_TRACT
  Filled 2012-08-29: qty 6.7

## 2012-08-29 MED ORDER — CITALOPRAM HYDROBROMIDE 20 MG PO TABS
20.0000 mg | ORAL_TABLET | Freq: Every day | ORAL | Status: DC
  Administered 2012-08-29 – 2012-08-30 (×2): 20 mg via ORAL
  Filled 2012-08-29 (×3): qty 1

## 2012-08-29 MED ORDER — LIDOCAINE 5 % EX OINT
1.0000 "application " | TOPICAL_OINTMENT | Freq: Three times a day (TID) | CUTANEOUS | Status: DC | PRN
  Filled 2012-08-29: qty 35.44

## 2012-08-29 MED ORDER — OXYBUTYNIN CHLORIDE 5 MG PO TABS
5.0000 mg | ORAL_TABLET | Freq: Every day | ORAL | Status: DC
  Administered 2012-08-30 – 2012-08-31 (×2): 5 mg via ORAL
  Filled 2012-08-29 (×2): qty 1

## 2012-08-29 MED ORDER — ENOXAPARIN SODIUM 40 MG/0.4ML ~~LOC~~ SOLN
40.0000 mg | SUBCUTANEOUS | Status: DC
Start: 2012-08-29 — End: 2012-08-31
  Administered 2012-08-29 – 2012-08-30 (×2): 40 mg via SUBCUTANEOUS
  Filled 2012-08-29 (×3): qty 0.4

## 2012-08-29 MED ORDER — OXYCODONE HCL 5 MG PO TABS
5.0000 mg | ORAL_TABLET | Freq: Four times a day (QID) | ORAL | Status: DC | PRN
  Administered 2012-08-30 (×2): 5 mg via ORAL
  Filled 2012-08-29 (×2): qty 1

## 2012-08-29 MED ORDER — PANTOPRAZOLE SODIUM 40 MG PO TBEC
40.0000 mg | DELAYED_RELEASE_TABLET | Freq: Every morning | ORAL | Status: DC
  Administered 2012-08-30 – 2012-08-31 (×2): 40 mg via ORAL
  Filled 2012-08-29 (×2): qty 1

## 2012-08-29 MED ORDER — ACETAMINOPHEN 325 MG PO TABS: 650.0000 mg | ORAL_TABLET | ORAL | Status: DC | PRN

## 2012-08-29 MED ORDER — DEXTROSE 5 % IV SOLN
1.0000 g | INTRAVENOUS | Status: DC
  Administered 2012-08-29 – 2012-08-30 (×2): 1 g via INTRAVENOUS
  Filled 2012-08-29 (×2): qty 10

## 2012-08-29 MED ORDER — POLYETHYLENE GLYCOL 3350 17 G PO PACK
17.0000 g | PACK | Freq: Every morning | ORAL | Status: DC
Start: 2012-08-30 — End: 2012-08-31
  Administered 2012-08-30 – 2012-08-31 (×2): 17 g via ORAL

## 2012-08-29 MED ORDER — OXYBUTYNIN CHLORIDE 5 MG PO TABS: 5.0000 mg | ORAL_TABLET | Freq: Two times a day (BID) | ORAL | Status: DC

## 2012-08-29 MED ORDER — ONDANSETRON HCL 4 MG/2ML IJ SOLN: 4.0000 mg | Freq: Four times a day (QID) | INTRAMUSCULAR | Status: DC | PRN

## 2012-08-29 MED ORDER — TRAMADOL HCL 50 MG PO TABS: 50.0000 mg | ORAL_TABLET | Freq: Four times a day (QID) | ORAL | Status: DC | PRN

## 2012-08-29 MED ORDER — SODIUM CHLORIDE 0.9 % IJ SOLN: 3.0000 mL | INTRAMUSCULAR | Status: DC | PRN

## 2012-08-29 MED ORDER — SIMETHICONE 80 MG PO CHEW
80.0000 mg | CHEWABLE_TABLET | Freq: Four times a day (QID) | ORAL | Status: DC | PRN
  Filled 2012-08-29: qty 1

## 2012-08-29 MED ORDER — MORPHINE SULFATE 2 MG/ML IJ SOLN: 1.0000 mg | INTRAMUSCULAR | Status: DC | PRN

## 2012-08-29 MED ORDER — CARVEDILOL 6.25 MG PO TABS
18.7500 mg | ORAL_TABLET | Freq: Two times a day (BID) | ORAL | Status: DC
  Administered 2012-08-29 – 2012-08-31 (×4): 18.75 mg via ORAL
  Filled 2012-08-29 (×7): qty 1

## 2012-08-29 NOTE — H&P (Signed)
[facility] History and Physical  [name] WUJ:[id number] DOB: February 07, 1932 DOA: 08/29/2012   PCP: Florentina Jenny, MD    Chief Complaint: fall at the ALF.   HPI: [name] is a 79 y.o. female with h/o PAF s/p pacemaker, CAD, hypertension, depression, came in after a fall at ALF. As per the patient, she was trying to sit on the toilet but was on the floor. And on arrival to ED she was found to have minimally displaced greater trochanteric fracture on the left side assocaited with painful ROM, . She denies any other complaints. She was admitted to medical service, orthopedics Dr Ophelia Charter consulted by ED.   Review of Systems: The patient denies anorexia, fever, weight loss,, vision loss, decreased hearing, hoarseness, chest pain, syncope, dyspnea on exertion, peripheral edema, balance deficits, hemoptysis, abdominal pain, melena, hematochezia, severe indigestion/heartburn, hematuria, incontinence, genital sores, muscle weakness, suspicious skin lesions, transient blindness, , depression, unusual weight change, abnormal bleeding, enlarged lymph nodes, angioedema, and breast masses.    Past Medical History  Diagnosis Date  . Pacemaker     MDT-DDD  . Sick sinus syndrome     tachy-brady  . Atrial fibrillation   . Arthritis   . CAD (coronary artery disease)   . DM (diabetes mellitus)     type II  . Diverticulitis   . Depression    Past Surgical History  Procedure Laterality Date  . Kidney tumor    . Lung cancer surgery    . Breast biopsy    . Pacemaker insertion  ~  . Cholecystectomy     Social History:  reports that she quit smoking about 30 years ago. She has never used smokeless tobacco. She reports that she does not drink alcohol or use illicit drugs.  where does patient live--, ALF, No Known Allergies  Family History  Problem Relation Age of Onset  . Diabetes    . Breast cancer    . Alcohol abuse      ADDICTION    Prior to Admission medications   Medication Sig  Start Date End Date Taking? Authorizing Provider  acetaminophen (TYLENOL) 325 MG tablet Take 650 mg by mouth every 4 (four) hours as needed. FOR PAIN   Yes Historical Provider, MD  albuterol (PROVENTIL HFA;VENTOLIN HFA) 108 (90 BASE) MCG/ACT inhaler Inhale 1 puff into the lungs 4 (four) times daily.   Yes Historical Provider, MD  Calcium Citrate-Vitamin D (CITRACAL + D PO) Take 2 tablets by mouth every morning.    Yes Historical Provider, MD  carvedilol (COREG) 12.5 MG tablet Take 18.75 mg by mouth 2 (two) times daily with a meal.    Yes Historical Provider, MD  citalopram (CELEXA) 20 MG tablet Take 20 mg by mouth at bedtime.    Yes Historical Provider, MD  Estradiol 10 MCG TABS Place 1 tablet vaginally once a week. GIVEN ON WEDNESDAYS   Yes Historical Provider, MD  ferrous sulfate 325 (65 FE) MG tablet Take 325 mg by mouth daily with breakfast.   Yes Historical Provider, MD  lidocaine (XYLOCAINE) 5 % ointment Apply 1 application topically 3 (three) times daily as needed (as needed for knee pain).   Yes Historical Provider, MD  lovastatin (MEVACOR) 20 MG tablet Take 20 mg by mouth at bedtime.   Yes Historical Provider, MD  metFORMIN (GLUCOPHAGE) 500 MG tablet Take 250 mg by mouth 2 (two) times daily with a meal.    Yes Historical Provider, MD  niacin (NIASPAN) 500 MG  CR tablet Take 500 mg by mouth at bedtime.   Yes Historical Provider, MD  oxybutynin (DITROPAN) 5 MG tablet Take 5-10 mg by mouth 2 (two) times daily. 2 @ bedtime  1 in AM   Yes Historical Provider, MD  oxyCODONE (OXY IR/ROXICODONE) 5 MG immediate release tablet Take 5 mg by mouth at bedtime.   Yes Historical Provider, MD  pantoprazole (PROTONIX) 40 MG tablet Take 40 mg by mouth every morning.   Yes Historical Provider, MD  polyethylene glycol (MIRALAX / GLYCOLAX) packet Take 17 g by mouth every morning.   Yes Historical Provider, MD  simethicone (MYLICON) 125 MG chewable tablet Chew 125 mg by mouth 2 (two) times daily as needed for  flatulence.   Yes Historical Provider, MD  traZODone (DESYREL) 50 MG tablet Take 75 mg by mouth at bedtime.    Yes Historical Provider, MD  vitamin D, CHOLECALCIFEROL, 400 UNITS tablet Take 800 Units by mouth daily.   Yes Historical Provider, MD   Physical Exam: Filed Vitals:   08/29/12 1300 08/29/12 1725  BP: 119/51 117/57  Pulse: 65 84  Temp: 98 F (36.7 C)   TempSrc: Oral   Resp: 16 15  SpO2: 97% 99%    Constitutional: Vital signs reviewed.  Patient is a well-developed and well-nourished  in no acute distress and cooperative with exam. Alert and oriented x3.  Head: Normocephalic and atraumatic Mouth: no erythema or exudates, MMM Eyes: PERRL, EOMI, conjunctivae normal, No scleral icterus.  Neck: Supple, Trachea midline normal ROM, No JVD, mass, thyromegaly, or carotid bruit present.  Cardiovascular: RRR, S1 normal, S2 normal, no MRG, pulses symmetric and intact bilaterally Pulmonary/Chest: CTAB, no wheezes, rales, or rhonchi Abdominal: Soft. Non-tender, non-distended, bowel sounds are normal, no masses, organomegaly, or guarding present.  Musculoskeletal: No joint deformities, erythema, or stiffness,painful ROM ont he left side.  Neurological: A&O x3, Strength is normal and symmetric bilaterally,, no focal motor deficit, sensory intact to light touch bilaterally.  Psychiatric: Normal mood and affect. speech and behavior is normal.  Labs on Admission:  Basic Metabolic Panel:  Recent Labs Lab 08/29/12 1635  NA 136  K 3.9  CL 101  CO2 26  GLUCOSE 112*  BUN 23  CREATININE 0.90  CALCIUM 9.4   Liver Function Tests: No results found for this basename: AST, ALT, ALKPHOS, BILITOT, PROT, ALBUMIN,  in the last 168 hours No results found for this basename: LIPASE, AMYLASE,  in the last 168 hours No results found for this basename: AMMONIA,  in the last 168 hours CBC:  Recent Labs Lab 08/29/12 1635  WBC 7.2  NEUTROABS 5.7  HGB 11.0*  HCT 32.6*  MCV 87.9  PLT 165    Cardiac Enzymes: No results found for this basename: CKTOTAL, CKMB, CKMBINDEX, TROPONINI,  in the last 168 hours  BNP (last 3 results) No results found for this basename: PROBNP,  in the last 8760 hours CBG: No results found for this basename: GLUCAP,  in the last 168 hours  Radiological Exams on Admission: Dg Hip Complete Left  08/29/2012  *RADIOLOGY REPORT*  Clinical Data: Fall with left hip pain.  LEFT HIP - COMPLETE 2+ VIEW  Comparison: 10/11/2007  Findings: No acute fracture or dislocation is identified.  Bony pelvis is intact.  Soft tissues are unremarkable.  IMPRESSION: No acute fracture identified.   Original Report Authenticated By: Irish Lack, M.D.    Ct Hip Left Wo Contrast  08/29/2012  *RADIOLOGY REPORT*  Clinical Data: Left hip pain  since a fall 4 days ago.  No visible fracture on radiographs earlier today.  CT OF THE LEFT HIP WITHOUT CONTRAST  Technique:  Multidetector CT imaging was performed according to the standard protocol. Multiplanar CT image reconstructions were also generated.  Comparison: Radiographs obtained earlier today.  Findings: Diffuse osteopenia.  Fracture through the superior aspect of the left greater trochanter with minimal proximal displacement of the proximal fragment.  The intertrochanteric region and femoral neck are intact.  No acute fractures are seen.  The ischium and acetabulum are intact.  No dislocation.  Mild atheromatous arterial calcifications.  No visible soft tissue abnormalities.  IMPRESSION: Minimally displaced fracture through the superior aspect of the left greater trochanter.   Original Report Authenticated By: Beckie Salts, M.D.     EKG: pending.   Assessment/Plan Active Problems: 1. Left greater trochanteric fracture: bed rest tonight. PT EVAL IN AM. Ortho Dr Ophelia Charter called by ED physician. Probably non surgical management.   2. Hypertension: controlled.   3. CAD; resume home aspirin.   4. UTI: rocephin started. Urine cultures  pending.  5. Depression: continue celexa.   DVT prophylaxis.    Code Status: full code Family Communication:none atbedside Disposition Plan: pending PT eval.    Jawanza Zambito [facility] Pager [phone]  If 7PM-7AM, please contact night-coverage [email or web address] Password [facility] 08/29/2012, 7:00 PM

## 2012-08-29 NOTE — ED Notes (Signed)
[id number]<AV> Expected date:<BR> Expected time:<BR> Means of arrival:<BR> Comments:<BR> EMS 2

## 2012-08-29 NOTE — ED Notes (Signed)
Called to give report, rn is busy with a pt.

## 2012-08-29 NOTE — ED Notes (Signed)
PER EMS- pt picked up from [facility] with c/o L hip pain and fall x4days.  Reports fell off toilet Friday.  Reports L hip pain on movement.  Denies resting pain.  Pt is alert and oriented per baseline. Denies LOC or head injury.

## 2012-08-29 NOTE — ED Notes (Addendum)
Pt daughter reports Dr Ophelia Charter is the pt's orthopedic dr at [facility] orthopedics

## 2012-08-29 NOTE — ED Notes (Signed)
Daughter contact information Theodoro Parma [phone]. Also power of attorney.

## 2012-08-29 NOTE — ED Notes (Signed)
Called to give report, nurse tied up with new pt.

## 2012-08-30 DIAGNOSIS — N390 Urinary tract infection, site not specified: Secondary | ICD-10-CM | POA: Diagnosis present

## 2012-08-30 LAB — IRON AND TIBC: Iron: 24 ug/dL — ABNORMAL LOW (ref 42–135)

## 2012-08-30 LAB — FOLATE: Folate: 18.2 ng/mL

## 2012-08-30 NOTE — Consult Note (Addendum)
Reason for Consult:left troch nondisplaced fx Referring Physician: Thurman Coyer MD  [name] is an 79 y.o. female.  HPI:80yo female pt of mine with previous right IT hip fx treated with troch nail and healed fell yesterday getting off commode with acute left  Hip pain and nondisplaced greater troch fx seen on CT scan.    Past Medical History  Diagnosis Date  . Pacemaker     MDT-DDD  . Sick sinus syndrome     tachy-brady  . Atrial fibrillation   . Arthritis   . CAD (coronary artery disease)   . DM (diabetes mellitus)     type II  . Diverticulitis   . Depression     Past Surgical History  Procedure Laterality Date  . Kidney tumor    . Lung cancer surgery    . Breast biopsy    . Pacemaker insertion  ~  . Cholecystectomy      Family History  Problem Relation Age of Onset  . Diabetes    . Breast cancer    . Alcohol abuse      ADDICTION    Social History:  reports that she quit smoking about 30 years ago. She has never used smokeless tobacco. She reports that she does not drink alcohol or use illicit drugs.  Allergies: No Known Allergies  Medications: I have reviewed the patient's current medications.  Results for orders placed during the hospital encounter of 08/29/12 (from the past 48 hour(s))  URINALYSIS, ROUTINE W REFLEX MICROSCOPIC     Status: Abnormal   Collection Time    08/29/12  4:31 PM      Result Value Range   Color, Urine YELLOW  YELLOW   APPearance CLOUDY (*) CLEAR   Specific Gravity, Urine 1.018  1.005 - 1.030   pH 6.0  5.0 - 8.0   Glucose, UA NEGATIVE  NEGATIVE mg/dL   Hgb urine dipstick NEGATIVE  NEGATIVE   Bilirubin Urine NEGATIVE  NEGATIVE   Ketones, ur NEGATIVE  NEGATIVE mg/dL   Protein, ur NEGATIVE  NEGATIVE mg/dL   Urobilinogen, UA 0.2  0.0 - 1.0 mg/dL   Nitrite NEGATIVE  NEGATIVE   Leukocytes, UA LARGE (*) NEGATIVE  URINE MICROSCOPIC-ADD ON     Status: Abnormal   Collection Time    08/29/12  4:31 PM      Result Value Range   Squamous  Epithelial / LPF FEW (*) RARE   WBC, UA 21-50  <3 WBC/hpf   Bacteria, UA MANY (*) RARE   Urine-Other RENAL TUBULAR EPITHELIALS PRESENT    BASIC METABOLIC PANEL     Status: Abnormal   Collection Time    08/29/12  4:35 PM      Result Value Range   Sodium 136  135 - 145 mEq/L   Potassium 3.9  3.5 - 5.1 mEq/L   Chloride 101  96 - 112 mEq/L   CO2 26  19 - 32 mEq/L   Glucose, Bld 112 (*) 70 - 99 mg/dL   BUN 23  6 - 23 mg/dL   Creatinine, Ser 1.61  0.50 - 1.10 mg/dL   Calcium 9.4  8.4 - 09.6 mg/dL   GFR calc non Af Amer 60 (*) >90 mL/min   GFR calc Af Amer 69 (*) >90 mL/min   Comment:            The eGFR has been calculated     using the CKD EPI equation.     This calculation  has not been     validated in all clinical     situations.     eGFR's persistently     <90 mL/min signify     possible Chronic Kidney Disease.  CBC WITH DIFFERENTIAL     Status: Abnormal   Collection Time    08/29/12  4:35 PM      Result Value Range   WBC 7.2  4.0 - 10.5 K/uL   RBC 3.71 (*) 3.87 - 5.11 MIL/uL   Hemoglobin 11.0 (*) 12.0 - 15.0 g/dL   HCT 81.1 (*) 91.4 - 78.2 %   MCV 87.9  78.0 - 100.0 fL   MCH 29.6  26.0 - 34.0 pg   MCHC 33.7  30.0 - 36.0 g/dL   RDW 95.6  21.3 - 08.6 %   Platelets 165  150 - 400 K/uL   Neutrophils Relative 80 (*) 43 - 77 %   Neutro Abs 5.7  1.7 - 7.7 K/uL   Lymphocytes Relative 11 (*) 12 - 46 %   Lymphs Abs 0.8  0.7 - 4.0 K/uL   Monocytes Relative 6  3 - 12 %   Monocytes Absolute 0.4  0.1 - 1.0 K/uL   Eosinophils Relative 3  0 - 5 %   Eosinophils Absolute 0.2  0.0 - 0.7 K/uL   Basophils Relative 0  0 - 1 %   Basophils Absolute 0.0  0.0 - 0.1 K/uL  VITAMIN B12     Status: None   Collection Time    08/29/12  7:18 PM      Result Value Range   Vitamin B-12 395  211 - 911 pg/mL  FOLATE     Status: None   Collection Time    08/29/12  7:18 PM      Result Value Range   Folate 18.2     Comment: (NOTE)     Reference Ranges            Deficient:       0.4 - 3.3 ng/mL             Indeterminate:   3.4 - 5.4 ng/mL            Normal:              > 5.4 ng/mL  IRON AND TIBC     Status: Abnormal   Collection Time    08/29/12  7:18 PM      Result Value Range   Iron 24 (*) 42 - 135 ug/dL   TIBC 578 (*) 469 - 629 ug/dL   Saturation Ratios 11 (*) 20 - 55 %   UIBC 199  125 - 400 ug/dL  FERRITIN     Status: None   Collection Time    08/29/12  7:18 PM      Result Value Range   Ferritin 168  10 - 291 ng/mL  RETICULOCYTES     Status: Abnormal   Collection Time    08/29/12  7:18 PM      Result Value Range   Retic Ct Pct 1.7  0.4 - 3.1 %   RBC. 3.48 (*) 3.87 - 5.11 MIL/uL   Retic Count, Manual 59.2  19.0 - 186.0 K/uL    Dg Hip Complete Left  08/29/2012  *RADIOLOGY REPORT*  Clinical Data: Fall with left hip pain.  LEFT HIP - COMPLETE 2+ VIEW  Comparison: 10/11/2007  Findings: No acute fracture or dislocation is identified.  Bony pelvis is  intact.  Soft tissues are unremarkable.  IMPRESSION: No acute fracture identified.   Original Report Authenticated By: Irish Lack, M.D.    Ct Hip Left Wo Contrast  08/29/2012  *RADIOLOGY REPORT*  Clinical Data: Left hip pain since a fall 4 days ago.  No visible fracture on radiographs earlier today.  CT OF THE LEFT HIP WITHOUT CONTRAST  Technique:  Multidetector CT imaging was performed according to the standard protocol. Multiplanar CT image reconstructions were also generated.  Comparison: Radiographs obtained earlier today.  Findings: Diffuse osteopenia.  Fracture through the superior aspect of the left greater trochanter with minimal proximal displacement of the proximal fragment.  The intertrochanteric region and femoral neck are intact.  No acute fractures are seen.  The ischium and acetabulum are intact.  No dislocation.  Mild atheromatous arterial calcifications.  No visible soft tissue abnormalities.  IMPRESSION: Minimally displaced fracture through the superior aspect of the left greater trochanter.   Original Report  Authenticated By: Beckie Salts, M.D.     Review of Systems  Constitutional: Negative.   HENT: Negative.   Eyes: Negative.   Respiratory: Negative.   Cardiovascular: Negative.   Gastrointestinal: Negative.   Musculoskeletal:       Pain with left hip ROM  Skin: Negative.   Neurological: Positive for tremors. Negative for focal weakness.   Blood pressure 144/77, pulse 63, temperature 98.1 F (36.7 C), temperature source Oral, resp. rate 16, SpO2 96.00%. Physical Exam  Assessment/Plan: Left nondisplaced troch fx. Plan PT, OT consults  WBAT with walker.  Would get foley out as soon as able to transfer to bedside commode.  Should have significant pain relief in 3 to 4 wks.   No surgery required.   Jocelyn Lamer  [phone] 08/30/2012, 7:18 AM

## 2012-08-30 NOTE — Progress Notes (Addendum)
Clinical Social Work Department CLINICAL SOCIAL WORK PLACEMENT NOTE 08/30/2012  Patient:  TIWATOPE, EMMITT  Account Number:  [id number] Admit date:  08/29/2012  Clinical Social Worker:  Jacelyn Grip  Date/time:  08/30/2012 02:30 PM  Clinical Social Work is seeking post-discharge placement for this patient at the following level of care:   SKILLED NURSING   (*CSW will update this form in Epic as items are completed)   08/30/2012  Patient/family provided with [facility] Department of Clinical Social Work's list of facilities offering this level of care within the geographic area requested by the patient (or if unable, by the patient's family).  08/30/2012  Patient/family informed of their freedom to choose among providers that offer the needed level of care, that participate in Medicare, Medicaid or managed care program needed by the patient, have an available bed and are willing to accept the patient.  08/30/2012  Patient/family informed of [facility]' ownership interest in [facility], as well as of the fact that they are under no obligation to receive care at this facility.  PASARR submitted to EDS on 08/30/2012 PASARR number received from EDS on   FL2 transmitted to all facilities in geographic area requested by pt/family on  08/30/2012 FL2 transmitted to all facilities within larger geographic area on   Patient informed that his/her managed care company has contracts with or will negotiate with  certain facilities, including the following:     Patient/family informed of bed offers received:  08/31/2012 Patient chooses bed at [facility] Physician recommends and patient chooses bed at    Patient to be transferred to  on  [facility] on 08/31/2012 Patient to be transferred to facility by ambulance Sharin Mons)  The following physician request were entered in Epic:   Additional Comments:    Jacklynn Lewis, MSW, LCSWA  Clinical Social Work [phone]

## 2012-08-30 NOTE — Progress Notes (Signed)
Clinical Social Work Department BRIEF PSYCHOSOCIAL ASSESSMENT 08/30/2012  Patient:  [name], [name]     Account Number:  [id number]     Admit date:  08/29/2012  Clinical Social Worker:  Jacelyn Grip  Date/Time:  08/30/2012 02:30 PM  Referred by:  Physician  Date Referred:  08/30/2012 Referred for  ALF Placement  SNF Placement   Other Referral:   Interview type:  Family Other interview type:    PSYCHOSOCIAL DATA Living Status:  FACILITY Admitted from facility:  [facility] Level of care:  Assisted Living Primary support name:  Debbie Drake/daughter/[phone] Primary support relationship to patient:  CHILD, ADULT Degree of support available:   adequate    CURRENT CONCERNS Current Concerns  Post-Acute Placement   Other Concerns:    SOCIAL WORK ASSESSMENT / PLAN CSW attempted to meet with pt at bedside however pt sleeping soundly at this time and did not awake to her her name being called or knock on the door.    CSW contact pt daughter, Eunice Blase via telephone. Pt daughter Eunice Blase confirmed that pt admitted from [facility]F. CSW discussed evaluation from PT and their recommendation for either ST rehab or return to [facility]F with [facility] if ALF feels they can meet pt needs at this time. Pt daughter preference would be to return to ALF, but is agreeable to SNF search for rehab to [facility] and [facility].    CSW contacted [facility] ALF and left mesage for facility resident care coordinator. CSW faxed clinicals to ALF for ALF to review and determine if facility can meet pt needs at this time.    CSW initiated SNF search to [facility] and [facility] at pt daughter request for secondary options if [facility] ALF is unable to accept pt back.    CSW to follow up with ALF re: decision about if pt can return.    CSW to follow up with pt and pt daughter re: bed offers if ALF cannot meet pt needs.    CSW to continue to follow to  assist with pt discharge planning needs.   Assessment/plan status:  Psychosocial Support/Ongoing Assessment of Needs Other assessment/ plan:   discharge planning   Information/referral to community resources:   Referral back to [facility]F, Referral to [facility] and [facility].    PATIENT'S/FAMILY'S RESPONSE TO PLAN OF CARE: Per chart, pt is alert and oriented x 4, however pt was sleeping soundly during time of attempted visit. Pt daughter appears supportive and actively involved in pt care.    Jacklynn Lewis, MSW, LCSWA  Clinical Social Work [phone]

## 2012-08-30 NOTE — Progress Notes (Signed)
Nutrition Brief Note  Patient identified on the Malnutrition Screening Tool (MST) Report  Body mass index is 20.59 kg/(m^2). Patient meets criteria for normal weight based on current BMI. Wt history shows that pt has maintained wt at 120 lbs for the past few months but, pt reports that she usually weighs 110 lbs.  Current diet order is Heart healthy, patient is consuming approximately 100% of meals at this time. Pt reports that she has a good appetite and was eating well PTA with 3 meals daily. Pt has no questions or concerns. Labs and medications reviewed.   No nutrition interventions warranted at this time. If nutrition issues arise, please consult RD.   Ian Malkin RD, LDN Inpatient Clinical Dietitian Pager: [phone] After Hours Pager: [phone]

## 2012-08-30 NOTE — Progress Notes (Addendum)
[facility] PROGRESS NOTE  [name] ZOX:[id number] DOB: 10/15/1931 DOA: 08/29/2012 PCP: Florentina Jenny, MD  Assessment/Plan: 1. Left greater trochanteric fracture: minimally displaced fracture, Probably non surgical management. Ortho on board. Pain control and PT evaluation.  2. Hypertension: controlled.  3. CAD; resume home aspirin.  4. UTI: rocephin started. Urine cultures pending.  5. Depression: continue celexa.  6. PAF s/p pacemaker: stable. 7. DVT prophylaxis   Code Status: presumed full code Family Communication: none at bedside Disposition Plan: SNF tomorrow.    Consultants:  Orthopedics.    Antibiotics:  Rocephin 4/21  HPI/Subjective: Is comfortable. Looking forward to be discharged.   Objective: Filed Vitals:   08/30/12 0600 08/30/12 0746 08/30/12 0857 08/30/12 1105  BP: 144/77  135/67   Pulse: 63  76   Temp: 98.1 F (36.7 C)     TempSrc:      Resp: 16     Height:    5\' 4"  (1.626 m)  Weight:    54.432 kg (120 lb)  SpO2: 96% 95%      Intake/Output Summary (Last 24 hours) at 08/30/12 1654 Last data filed at 08/30/12 1407  Gross per 24 hour  Intake    930 ml  Output    350 ml  Net    580 ml   Filed Weights   08/30/12 1105  Weight: 54.432 kg (120 lb)    Exam:  Cardiovascular: RRR, S1 normal, S2 normal, no MRG, pulses symmetric and intact bilaterally  Pulmonary/Chest: CTAB, no wheezes, rales, or rhonchi  Abdominal: Soft. Non-tender, non-distended, bowel sounds are normal, no masses, organomegaly, or guarding present.  Musculoskeletal: No joint deformities, erythema, or stiffness,painful ROM ont he left side.  Neurological: A&O x3, Strength is normal and symmetric bilaterally,, no focal motor deficit   Data Reviewed: Basic Metabolic Panel:  Recent Labs Lab 08/29/12 1635  NA 136  K 3.9  CL 101  CO2 26  GLUCOSE 112*  BUN 23  CREATININE 0.90  CALCIUM 9.4   Liver Function Tests: No results found for this basename: AST, ALT,  ALKPHOS, BILITOT, PROT, ALBUMIN,  in the last 168 hours No results found for this basename: LIPASE, AMYLASE,  in the last 168 hours No results found for this basename: AMMONIA,  in the last 168 hours CBC:  Recent Labs Lab 08/29/12 1635  WBC 7.2  NEUTROABS 5.7  HGB 11.0*  HCT 32.6*  MCV 87.9  PLT 165   Cardiac Enzymes: No results found for this basename: CKTOTAL, CKMB, CKMBINDEX, TROPONINI,  in the last 168 hours BNP (last 3 results) No results found for this basename: PROBNP,  in the last 8760 hours CBG: No results found for this basename: GLUCAP,  in the last 168 hours  No results found for this or any previous visit (from the past 240 hour(s)).   Studies: Dg Hip Complete Left  08/29/2012  *RADIOLOGY REPORT*  Clinical Data: Fall with left hip pain.  LEFT HIP - COMPLETE 2+ VIEW  Comparison: 10/11/2007  Findings: No acute fracture or dislocation is identified.  Bony pelvis is intact.  Soft tissues are unremarkable.  IMPRESSION: No acute fracture identified.   Original Report Authenticated By: Irish Lack, M.D.    Ct Hip Left Wo Contrast  08/29/2012  *RADIOLOGY REPORT*  Clinical Data: Left hip pain since a fall 4 days ago.  No visible fracture on radiographs earlier today.  CT OF THE LEFT HIP WITHOUT CONTRAST  Technique:  Multidetector CT imaging was performed according to  the standard protocol. Multiplanar CT image reconstructions were also generated.  Comparison: Radiographs obtained earlier today.  Findings: Diffuse osteopenia.  Fracture through the superior aspect of the left greater trochanter with minimal proximal displacement of the proximal fragment.  The intertrochanteric region and femoral neck are intact.  No acute fractures are seen.  The ischium and acetabulum are intact.  No dislocation.  Mild atheromatous arterial calcifications.  No visible soft tissue abnormalities.  IMPRESSION: Minimally displaced fracture through the superior aspect of the left greater trochanter.    Original Report Authenticated By: Beckie Salts, M.D.     Scheduled Meds: . albuterol  1 puff Inhalation QID  . carvedilol  18.75 mg Oral BID WC  . cefTRIAXone (ROCEPHIN)  IV  1 g Intravenous Q24H  . citalopram  20 mg Oral QHS  . enoxaparin (LOVENOX) injection  40 mg Subcutaneous Q24H  . oxybutynin  10 mg Oral QHS  . oxybutynin  5 mg Oral Daily  . oxyCODONE  5 mg Oral QHS  . pantoprazole  40 mg Oral q morning - 10a  . polyethylene glycol  17 g Oral q morning - 10a  . simvastatin  20 mg Oral q1800  . traZODone  75 mg Oral QHS   Continuous Infusions:         [facility]  [facility] Pager [phone]. If 7PM-7AM, please contact night-coverage at [email or web address], password [facility] 08/30/2012, 4:54 PM  LOS: 1 day

## 2012-08-30 NOTE — Evaluation (Signed)
Physical Therapy Evaluation Patient Details Name: [name] MRN: [id number] DOB: 08/25/1931 Today's Date: 08/30/2012 Time: 4098-1191 PT Time Calculation (min): 16 min  PT Assessment / Plan / Recommendation Clinical Impression  79 yo female s/p fall 4 days PTA, found to have nondisplaced greater trochanter  fx on L. Pt was able to ambulate 30 ft today with RW. Pt. will benefit from PT while in acute care. ALF will need to determine if pt. will be able to return.Pt. will need assistance  for mobility at this time.    PT Assessment       Follow Up Recommendations  SNF;Home health PT    Does the patient have the potential to tolerate intense rehabilitation      Barriers to Discharge        Equipment Recommendations  None recommended by PT    Recommendations for Other Services     Frequency Min 3X/week    Precautions / Restrictions Precautions Precautions: Fall Restrictions Weight Bearing Restrictions: Yes LLE Weight Bearing: Weight bearing as tolerated   Pertinent Vitals/Pain Pt states  Hip hurts "when I put weight on it"       Mobility  Bed Mobility Bed Mobility: Supine to Sit;Sitting - Scoot to Edge of Bed Supine to Sit: 3: Mod assist;HOB elevated Sitting - Scoot to Edge of Bed: 3: Mod assist Details for Bed Mobility Assistance: supportm of LLE to slide to edge. Transfers Transfers: Sit to Stand;Stand to Sit Sit to Stand: 1: +2 Total assist Sit to Stand: Patient Percentage: 60% Stand to Sit: To chair/3-in-1;With upper extremity assist Details for Transfer Assistance: tactile cues for reaching back to armrests. Ambulation/Gait Ambulation/Gait Assistance: 1: +2 Total assist Ambulation/Gait: Patient Percentage: 60% Ambulation Distance (Feet): 30 Feet Assistive device: Rolling walker Gait Pattern: Step-to pattern;Antalgic    Exercises     PT Diagnosis: Difficulty walking;Acute pain  PT Problem List: Decreased strength;Decreased range of motion;Decreased  activity tolerance;Decreased mobility;Decreased balance;Decreased knowledge of precautions;Decreased safety awareness;Decreased knowledge of use of DME;Pain PT Treatment Interventions: DME instruction;Gait training;Functional mobility training;Therapeutic activities;Therapeutic exercise;Patient/family education   PT Goals Acute Rehab PT Goals PT Goal Formulation: With patient Time For Goal Achievement: 09/13/12 Potential to Achieve Goals: Good Pt will go Supine/Side to Sit: with supervision PT Goal: Supine/Side to Sit - Progress: Goal set today Pt will go Sit to Supine/Side: with supervision PT Goal: Sit to Supine/Side - Progress: Goal set today Pt will go Sit to Stand: with supervision PT Goal: Sit to Stand - Progress: Goal set today Pt will go Stand to Sit: with supervision PT Goal: Stand to Sit - Progress: Goal set today Pt will Ambulate: 51 - 150 feet;with supervision;with rolling walker PT Goal: Ambulate - Progress: Goal set today Pt will Perform Home Exercise Program: with min assist PT Goal: Perform Home Exercise Program - Progress: Goal set today  Visit Information  Last PT Received On: 08/30/12 Assistance Needed: +2    Subjective Data  Subjective: I fell getting on the toilet. It was better than it has been. I think I did pretty good. Patient Stated Goal: agreed to walk.   Prior Functioning  Home Living Type of Home: Assisted living Home Adaptive Equipment: Walker - rolling Prior Function Comments: Pt reports she ambulates with a RW, no additional information is available. Communication Communication: HOH    Cognition  Cognition Arousal/Alertness: Awake/alert Behavior During Therapy: WFL for tasks assessed/performed Overall Cognitive Status: No family/caregiver present to determine baseline cognitive functioning Area of Impairment: Orientation Orientation  Level: Place General Comments: looked at calendar and stated April    Extremity/Trunk Assessment Right  Lower Extremity Assessment RLE ROM/Strength/Tone: [facility] for tasks assessed RLE Sensation: WFL - Light Touch Left Lower Extremity Assessment LLE ROM/Strength/Tone: Deficits LLE ROM/Strength/Tone Deficits: slow to allow hip to flex, knee flexion to about 90 at edge of bed. LLE Sensation: WFL - Light Touch   Balance    End of Session PT - End of Session Activity Tolerance: Patient tolerated treatment well Patient left: in chair;with call bell/phone within reach Nurse Communication: Mobility status  GP     Rada Hay 08/30/2012, 1:21 PM Blanchard Kelch PT [phone]

## 2012-08-30 NOTE — ED Provider Notes (Signed)
History     CSN: [id number]  Arrival date & time 08/29/12  1250   First MD Initiated Contact with Patient 08/29/12 1343      Chief Complaint  Patient presents with  . Hip Pain  . Fall    (Consider location/radiation/quality/duration/timing/severity/associated sxs/prior treatment) HPI The patient presents emergency department with left hip pain.  Patient, states she fell on Friday and started having pain that got worse over the weekend.  Patient, states she was attempting to sit on the toilet, when she missed, landing on her left.  Patient, states, that movement and palpation make the pain, worse.  Patient denies chest pain, shortness of breath, back pain, neck pain, loss of consciousness, headache, blurred vision, fever, dysuria, or syncope.  Patient, states, that she did not take anything prior to arrival for her pain.  Patient, states, that sitting still makes the pain, better. Past Medical History  Diagnosis Date  . Pacemaker     MDT-DDD  . Sick sinus syndrome     tachy-brady  . Atrial fibrillation   . Arthritis   . CAD (coronary artery disease)   . DM (diabetes mellitus)     type II  . Diverticulitis   . Depression     Past Surgical History  Procedure Laterality Date  . Kidney tumor    . Lung cancer surgery    . Breast biopsy    . Pacemaker insertion  ~  . Cholecystectomy      Family History  Problem Relation Age of Onset  . Diabetes    . Breast cancer    . Alcohol abuse      ADDICTION    History  Substance Use Topics  . Smoking status: Former Smoker    Quit date: 08/30/1982  . Smokeless tobacco: Never Used  . Alcohol Use: No    OB History   Grav Para Term Preterm Abortions TAB SAB Ect Mult Living                  Review of Systems All other systems negative except as documented in the HPI. All pertinent positives and negatives as reviewed in the HPI.  Allergies  Review of patient's allergies indicates no known allergies.  Home Medications   No current outpatient prescriptions on file.  BP 135/67  Pulse 76  Temp(Src) 98.1 F (36.7 C) (Oral)  Resp 16  Ht 5\' 4"  (1.626 m)  Wt 120 lb (54.432 kg)  BMI 20.59 kg/m2  SpO2 95%  Physical Exam  Nursing note and vitals reviewed. Constitutional: She is oriented to person, place, and time. She appears well-developed and well-nourished. No distress.  HENT:  Head: Normocephalic and atraumatic.  Mouth/Throat: Oropharynx is clear and moist.  Eyes: Pupils are equal, round, and reactive to light.  Neck: Normal range of motion. Neck supple.  Cardiovascular: Normal rate, regular rhythm and normal heart sounds.  Exam reveals no gallop and no friction rub.   No murmur heard. Pulmonary/Chest: Effort normal and breath sounds normal. No respiratory distress.  Abdominal: Soft. Bowel sounds are normal. She exhibits no distension. There is no tenderness.  Musculoskeletal:       Left hip: She exhibits decreased range of motion, tenderness and bony tenderness. She exhibits no crepitus, no deformity and no laceration.  Neurological: She is alert and oriented to person, place, and time. She exhibits normal muscle tone. Coordination normal.  Skin: Skin is warm and dry.    ED Course  Procedures (including critical care  time)  Labs Reviewed  BASIC METABOLIC PANEL - Abnormal; Notable for the following:    Glucose, Bld 112 (*)    GFR calc non Af Amer 60 (*)    GFR calc Af Amer 69 (*)    All other components within normal limits  CBC WITH DIFFERENTIAL - Abnormal; Notable for the following:    RBC 3.71 (*)    Hemoglobin 11.0 (*)    HCT 32.6 (*)    Neutrophils Relative 80 (*)    Lymphocytes Relative 11 (*)    All other components within normal limits  URINALYSIS, ROUTINE W REFLEX MICROSCOPIC - Abnormal; Notable for the following:    APPearance CLOUDY (*)    Leukocytes, UA LARGE (*)    All other components within normal limits  URINE MICROSCOPIC-ADD ON - Abnormal; Notable for the following:     Squamous Epithelial / LPF FEW (*)    Bacteria, UA MANY (*)    All other components within normal limits  IRON AND TIBC - Abnormal; Notable for the following:    Iron 24 (*)    TIBC 223 (*)    Saturation Ratios 11 (*)    All other components within normal limits  RETICULOCYTES - Abnormal; Notable for the following:    RBC. 3.48 (*)    All other components within normal limits  URINE CULTURE  VITAMIN B12  FOLATE  FERRITIN   Dg Hip Complete Left  08/29/2012  *RADIOLOGY REPORT*  Clinical Data: Fall with left hip pain.  LEFT HIP - COMPLETE 2+ VIEW  Comparison: 10/11/2007  Findings: No acute fracture or dislocation is identified.  Bony pelvis is intact.  Soft tissues are unremarkable.  IMPRESSION: No acute fracture identified.   Original Report Authenticated By: Irish Lack, M.D.    Ct Hip Left Wo Contrast  08/29/2012  *RADIOLOGY REPORT*  Clinical Data: Left hip pain since a fall 4 days ago.  No visible fracture on radiographs earlier today.  CT OF THE LEFT HIP WITHOUT CONTRAST  Technique:  Multidetector CT imaging was performed according to the standard protocol. Multiplanar CT image reconstructions were also generated.  Comparison: Radiographs obtained earlier today.  Findings: Diffuse osteopenia.  Fracture through the superior aspect of the left greater trochanter with minimal proximal displacement of the proximal fragment.  The intertrochanteric region and femoral neck are intact.  No acute fractures are seen.  The ischium and acetabulum are intact.  No dislocation.  Mild atheromatous arterial calcifications.  No visible soft tissue abnormalities.  IMPRESSION: Minimally displaced fracture through the superior aspect of the left greater trochanter.   Original Report Authenticated By: Beckie Salts, M.D.      1. Greater trochanter fracture, left, closed, initial encounter   2. Coronary atherosclerosis of unspecified type of vessel, native or graft   3. Depressive disorder, not elsewhere  classified   4. Pacemaker    I spoke with Dr. Ophelia Charter from orthopedics, who will come to see the patient.  I spoke with the, [facility] about admission for her.   MDM  MDM Reviewed: nursing note, vitals and previous chart Interpretation: labs, CT scan and x-ray Consults: admitting MD and orthopedics            Carlyle Dolly, PA-C 08/30/12 1532

## 2012-08-31 DIAGNOSIS — N390 Urinary tract infection, site not specified: Secondary | ICD-10-CM

## 2012-08-31 DIAGNOSIS — I2510 Atherosclerotic heart disease of native coronary artery without angina pectoris: Secondary | ICD-10-CM

## 2012-08-31 DIAGNOSIS — S72109A Unspecified trochanteric fracture of unspecified femur, initial encounter for closed fracture: Secondary | ICD-10-CM

## 2012-08-31 DIAGNOSIS — E119 Type 2 diabetes mellitus without complications: Secondary | ICD-10-CM

## 2012-08-31 LAB — URINE CULTURE

## 2012-08-31 MED ORDER — SULFAMETHOXAZOLE-TMP DS 800-160 MG PO TABS: 1.0000 | ORAL_TABLET | Freq: Two times a day (BID) | ORAL | Status: DC

## 2012-08-31 MED ORDER — OXYCODONE HCL 5 MG PO TABS: 5.0000 mg | ORAL_TABLET | Freq: Every day | ORAL | Status: DC

## 2012-08-31 MED ORDER — SULFAMETHOXAZOLE-TMP DS 800-160 MG PO TABS
1.0000 | ORAL_TABLET | Freq: Two times a day (BID) | ORAL | Status: DC
  Administered 2012-08-31: 1 via ORAL
  Filled 2012-08-31 (×2): qty 1

## 2012-08-31 NOTE — Progress Notes (Signed)
Attempted to call report x2 to [facility] RN [phone]).  No answer.

## 2012-08-31 NOTE — Progress Notes (Signed)
Noted pt was to be D/C'd to [facility] today.  Will defer this OT eval to [facility] since OT will not be able to follow up with this pt due to d/c.  If d/c status changes,  please re-order OT services. Tory Emerald, [facility] [phone]

## 2012-08-31 NOTE — Discharge Summary (Signed)
Physician Discharge Summary  [name] ZOX:[id number] DOB: 17-Mar-1932 DOA: 08/29/2012  PCP: Florentina Jenny, MD  Admit date: 08/29/2012 Discharge date: 08/31/2012  Time spent: 40 minutes  Recommendations for Outpatient Follow-up:  1. Follow up as an outpatient as scheduled below  2. Continue treatment for UTI with Bactrim for 5 additional days. Please obtain urinalysis and urine cultures to document clearance after treatment.   Discharge Diagnoses:  Active Problems:   UTI (urinary tract infection)  Discharge Condition: stable  Diet recommendation: heart healthy  Filed Weights   08/30/12 1105  Weight: 54.432 kg (120 lb)   History of present illness:  [name] is a 79 y.o. female with h/o PAF s/p pacemaker, CAD, hypertension, depression, came in after a fall at ALF. As per the patient, she was trying to sit on the toilet but was on the floor. And on arrival to ED she was found to have minimally displaced greater trochanteric fracture on the left side assocaited with painful ROM, . She denies any other complaints. She was admitted to medical service, orthopedics Dr Ophelia Charter consulted by ED.   Hospital Course:  Left greater trochanteric fracture: minimally displaced fracture per imaging. Orthopedics was consulted on admission and suggested non surgical management at this point. WBAT as tolerated and expected to have pain improvement in 3-4 weeks. PT evaluated patient as well and recommended SNF before returning to ALF, but that is to be determined based on clinical evolution.  UTI: rocephin started. Urine cultures showed E coli resistant to Ciprofloxacin but sensitive to Bactrim. She has already received Ceftriaxone for 2 days, will recommend Bactrim for 5 additional days to complete a 7 day course. Please recheck urinalysis and urine cultures to document clearance.  Hypertension: controlled.  CAD; resume home aspirin.  Depression: continue celexa.  PAF s/p pacemaker: stable.    Procedures:  none   Consultations:  Orthopedics  Discharge Exam: Filed Vitals:   08/30/12 2049 08/31/12 0557 08/31/12 0819 08/31/12 0856  BP: 144/63 123/66 138/64   Pulse: 71 84 87   Temp: 98.4 F (36.9 C) 98.3 F (36.8 C)    TempSrc:      Resp: 16 14    Height:      Weight:      SpO2: 98% 97%  99%   General: NAD Cardiovascular: RRR without MRG Respiratory: CTA biL  Discharge Instructions   Future Appointments Provider Department Dept Phone   11/04/2012 11:15 AM [name], MD [facility] Main [facility] [place]) [phone]   12/30/2012 11:30 AM Rachael Fee [facility] [phone]   12/30/2012 12:00 PM Josph Macho, MD [facility] [phone]       Medication List    TAKE these medications       acetaminophen 325 MG tablet  Commonly known as:  TYLENOL  Take 650 mg by mouth every 4 (four) hours as needed. FOR PAIN     albuterol 108 (90 BASE) MCG/ACT inhaler  Commonly known as:  PROVENTIL HFA;VENTOLIN HFA  Inhale 1 puff into the lungs 4 (four) times daily.     carvedilol 12.5 MG tablet  Commonly known as:  COREG  Take 18.75 mg by mouth 2 (two) times daily with a meal.     citalopram 20 MG tablet  Commonly known as:  CELEXA  Take 20 mg by mouth at bedtime.     CITRACAL + D PO  Take 2 tablets by mouth  every morning.     Estradiol 10 MCG Tabs  Place 1 tablet vaginally once a week. GIVEN ON WEDNESDAYS     ferrous sulfate 325 (65 FE) MG tablet  Take 325 mg by mouth daily with breakfast.     lidocaine 5 % ointment  Commonly known as:  XYLOCAINE  Apply 1 application topically 3 (three) times daily as needed (as needed for knee pain).     lovastatin 20 MG tablet  Commonly known as:  MEVACOR  Take 20 mg by mouth at bedtime.     metFORMIN 500 MG tablet  Commonly known as:  GLUCOPHAGE  Take 250 mg by mouth 2 (two) times daily with a meal.     niacin 500 MG CR tablet  Commonly  known as:  NIASPAN  Take 500 mg by mouth at bedtime.     oxybutynin 5 MG tablet  Commonly known as:  DITROPAN  Take 5-10 mg by mouth 2 (two) times daily. 2 @ bedtime  1 in AM     oxyCODONE 5 MG immediate release tablet  Commonly known as:  Oxy IR/ROXICODONE  Take 5 mg by mouth at bedtime.     oxyCODONE 5 MG immediate release tablet  Commonly known as:  Oxy IR/ROXICODONE  Take 1 tablet (5 mg total) by mouth at bedtime.     pantoprazole 40 MG tablet  Commonly known as:  PROTONIX  Take 40 mg by mouth every morning.     polyethylene glycol packet  Commonly known as:  MIRALAX / GLYCOLAX  Take 17 g by mouth every morning.     simethicone 125 MG chewable tablet  Commonly known as:  MYLICON  Chew 125 mg by mouth 2 (two) times daily as needed for flatulence.     sulfamethoxazole-trimethoprim 800-160 MG per tablet  Commonly known as:  BACTRIM DS  Take 1 tablet by mouth every 12 (twelve) hours.     traZODone 50 MG tablet  Commonly known as:  DESYREL  Take 75 mg by mouth at bedtime.     vitamin D (CHOLECALCIFEROL) 400 UNITS tablet  Take 800 Units by mouth daily.         The results of significant diagnostics from this hospitalization (including imaging, microbiology, ancillary and laboratory) are listed below for reference.    Significant Diagnostic Studies: Dg Hip Complete Left  08/29/2012  *RADIOLOGY REPORT*  Clinical Data: Fall with left hip pain.  LEFT HIP - COMPLETE 2+ VIEW  Comparison: 10/11/2007  Findings: No acute fracture or dislocation is identified.  Bony pelvis is intact.  Soft tissues are unremarkable.  IMPRESSION: No acute fracture identified.   Original Report Authenticated By: Irish Lack, M.D.    Ct Hip Left Wo Contrast  08/29/2012  *RADIOLOGY REPORT*  Clinical Data: Left hip pain since a fall 4 days ago.  No visible fracture on radiographs earlier today.  CT OF THE LEFT HIP WITHOUT CONTRAST  Technique:  Multidetector CT imaging was performed according to the  standard protocol. Multiplanar CT image reconstructions were also generated.  Comparison: Radiographs obtained earlier today.  Findings: Diffuse osteopenia.  Fracture through the superior aspect of the left greater trochanter with minimal proximal displacement of the proximal fragment.  The intertrochanteric region and femoral neck are intact.  No acute fractures are seen.  The ischium and acetabulum are intact.  No dislocation.  Mild atheromatous arterial calcifications.  No visible soft tissue abnormalities.  IMPRESSION: Minimally displaced fracture through the superior aspect of the left  greater trochanter.   Original Report Authenticated By: Beckie Salts, M.D.   Microbiology: Recent Results (from the past 240 hour(s))  URINE CULTURE     Status: None   Collection Time    08/29/12 12:50 PM      Result Value Range Status   Specimen Description URINE, RANDOM STAT   Final   Special Requests NONE   Final   Culture  Setup Time 08/30/2012 00:30   Final   Colony Count >=100,000 COLONIES/ML   Final   Culture ESCHERICHIA COLI   Final   Report Status 08/31/2012 FINAL   Final   Organism ID, Bacteria ESCHERICHIA COLI   Final  AMPICILLIN Resistant >=32 CEFAZOLIN Sensitive <=4 CEFTRIAXONE Sensitive <=1  CIPROFLOXACIN Resistant >=4 GENTAMICIN Sensitive <=1  LEVOFLOXACIN Resistant >=8  NITROFURANTOIN Sensitive 32  PIP/TAZO Sensitive <=4  TOBRAMYCIN Sensitive <=1 TRIMETH/SULFA Sensitive <=20  Labs: Basic Metabolic Panel:  Recent Labs Lab 08/29/12 1635  NA 136  K 3.9  CL 101  CO2 26  GLUCOSE 112*  BUN 23  CREATININE 0.90  CALCIUM 9.4   CBC:  Recent Labs Lab 08/29/12 1635  WBC 7.2  NEUTROABS 5.7  HGB 11.0*  HCT 32.6*  MCV 87.9  PLT 165   Signed:  Laurena Valko  [facility] 08/31/2012, 11:14 AM

## 2012-08-31 NOTE — Progress Notes (Signed)
CSW received notification from [facility] that facility feels that pt would benefit from short term rehab at [facility] prior to returning to ALF.   CSW notified pt daughter and provided SNF bed offers.  Pt daughter chooses bed at [facility].  CSW contacted [facility] who confirmed bed availability for today.  CSW to facilitate pt discharge needs this afternoon.  Jacklynn Lewis, MSW, LCSWA  Clinical Social Work [phone]

## 2012-08-31 NOTE — Progress Notes (Signed)
Pt for discharge to [facility].   CSW facilitated pt discharge needs including contacting facility, faxing pt discharge information, discussing with pt and pt daughter at bedside, providing RN phone number to call report, and arranging ambulance transportation for pt to [facility].   No further social work needs identified at this time.  CSW signing off.   Jacklynn Lewis, MSW, LCSWA  Clinical Social Work [phone]

## 2012-09-01 NOTE — ED Provider Notes (Signed)
  I performed a history and physical examination of [name] and discussed her management with Mr. Lawyer.  I agree with the history, physical, assessment, and plan of care, with the following exceptions: None  I was present for the following procedures: None Time Spent in Critical Care of the patient: None Time spent in discussions with the patient and family: 5    Linnet Bottari Corlis Leak, MD 09/01/12 2314

## 2012-09-05 ENCOUNTER — Other Ambulatory Visit: Payer: Self-pay | Admitting: *Deleted

## 2012-09-05 MED ORDER — OXYCODONE HCL 5 MG PO TABS: ORAL_TABLET | ORAL | Status: DC

## 2012-09-06 ENCOUNTER — Non-Acute Institutional Stay (SKILLED_NURSING_FACILITY): Payer: Medicare Other | Admitting: Internal Medicine

## 2012-09-06 DIAGNOSIS — I2510 Atherosclerotic heart disease of native coronary artery without angina pectoris: Secondary | ICD-10-CM

## 2012-09-06 DIAGNOSIS — I10 Essential (primary) hypertension: Secondary | ICD-10-CM

## 2012-09-06 DIAGNOSIS — S72112E Displaced fracture of greater trochanter of left femur, subsequent encounter for open fracture type I or II with routine healing: Secondary | ICD-10-CM

## 2012-09-06 DIAGNOSIS — S72009D Fracture of unspecified part of neck of unspecified femur, subsequent encounter for closed fracture with routine healing: Secondary | ICD-10-CM

## 2012-09-06 DIAGNOSIS — D62 Acute posthemorrhagic anemia: Secondary | ICD-10-CM

## 2012-09-07 ENCOUNTER — Non-Acute Institutional Stay (SKILLED_NURSING_FACILITY): Payer: Medicare Other | Admitting: Adult Health

## 2012-09-07 ENCOUNTER — Other Ambulatory Visit: Payer: Self-pay | Admitting: *Deleted

## 2012-09-07 DIAGNOSIS — E119 Type 2 diabetes mellitus without complications: Secondary | ICD-10-CM

## 2012-09-07 MED ORDER — OXYCODONE HCL 10 MG PO TABS: ORAL_TABLET | ORAL | Status: DC

## 2012-09-22 DIAGNOSIS — I10 Essential (primary) hypertension: Secondary | ICD-10-CM | POA: Insufficient documentation

## 2012-09-22 DIAGNOSIS — I2510 Atherosclerotic heart disease of native coronary artery without angina pectoris: Secondary | ICD-10-CM | POA: Insufficient documentation

## 2012-09-22 NOTE — Progress Notes (Signed)
Patient ID: [name], female   DOB: 09-14-1931, 79 y.o.   MRN: [id number]        HISTORY & PHYSICAL  DATE: 09/06/2012   FACILITY: [facility]  LEVEL OF CARE: SNF (31)  ALLERGIES:  No Known Allergies  CHIEF COMPLAINT:  Manage left greater trochanter fracture, hypertension, and coronary artery disease.    HISTORY OF PRESENT ILLNESS:  The patient is a 79 year-old, Caucasian female.    HIP FRACTURE: The patient had a mechanical fall and sustained a femur fracture.   Patient did not undergo surgery. Orthopedics recommended non-surgical management.  Patient is admitted to this facility for short-term rehabilitation. Patient denies hip pain currently. No complications reported from the pain medications currently being used.   HTN: Pt 's HTN remains stable.  Denies CP, sob, DOE, pedal edema, headaches, dizziness or visual disturbances.  No complications from the medications currently being used.  Last BP : 138/64, 123/66, 144/63.  CAD: The angina has been stable. The patient denies dyspnea on exertion, orthopnea, pedal edema, palpitations and paroxysmal nocturnal dyspnea. No complications noted from the medication presently being used.   PAST MEDICAL HISTORY :  Past Medical History  Diagnosis Date  . Pacemaker     MDT-DDD  . Sick sinus syndrome     tachy-brady  . Atrial fibrillation   . Arthritis   . CAD (coronary artery disease)   . DM (diabetes mellitus)     type II  . Diverticulitis   . Depression     PAST SURGICAL HISTORY: Past Surgical History  Procedure Laterality Date  . Kidney tumor    . Lung cancer surgery    . Breast biopsy    . Pacemaker insertion  ~  . Cholecystectomy      SOCIAL HISTORY:  reports that she quit smoking about 30 years ago. She has never used smokeless tobacco. She reports that she does not drink alcohol or use illicit drugs.  FAMILY HISTORY:  Family History  Problem Relation Age of Onset  . Diabetes    . Breast cancer     . Alcohol abuse      ADDICTION    CURRENT MEDICATIONS: Reviewed per [facility]  REVIEW OF SYSTEMS:  See HPI otherwise 14 point ROS is negative.  PHYSICAL EXAMINATION  VS:  T 98.3      P 87       RR 14     BP 138/64     POX 97% room air        WT (Lb)  GENERAL: no acute distress, normal body habitus EYES: conjunctivae normal, sclerae normal, normal eye lids MOUTH/THROAT: lips without lesions,no lesions in the mouth,tongue is without lesions,uvula elevates in midline NECK: supple, trachea midline, no neck masses, no thyroid tenderness, no thyromegaly LYMPHATICS: no LAN in the neck, no supraclavicular LAN RESPIRATORY: breathing is even & unlabored, BS CTAB CARDIAC: RRR, no murmur,no extra heart sounds, no edema GI:  ABDOMEN: abdomen soft, normal BS, no masses, no tenderness  LIVER/SPLEEN: no hepatomegaly, no splenomegaly MUSCULOSKELETAL: HEAD: normal to inspection & palpation BACK: no kyphosis, scoliosis or spinal processes tenderness EXTREMITIES: LEFT UPPER EXTREMITY: full range of motion, normal strength & tone RIGHT UPPER EXTREMITY:  full range of motion, normal strength & tone LEFT LOWER EXTREMITY: strength intact, range of motion not tested due to surgery  RIGHT LOWER EXTREMITY: strength intact, range of motion moderate  PSYCHIATRIC: the patient is alert & oriented to person, affect & behavior  appropriate  LABS/RADIOLOGY: Left hip x-ray:  No acute fracture.    Left hip CT showed minimally displaced fracture of the superior aspect of the left greater trochanter.   Urine culture great E.coli significantly.    Glucose 112, otherwise BMP normal.    Hemoglobin 11, MCV  87.9, otherwise CBC normal.    ASSESSMENT/PLAN:  Left greater trochanteric fracture.  Continue rehabilitation and conservative treatment.   Hypertension.  Well controlled.   Coronary artery disease.  Stable.   Acute blood loss anemia.  Reassess hemoglobin level.  Continue iron.   UTI.  Continue Bactrim as  prescribed.   Atrial fibrillation.  Status post pacemaker placement.    Diabetes mellitus.  Continue current medications.   Check CBC and BMP.   I have reviewed patient's medical records received at admission/from hospitalization.  CPT CODE: 16109

## 2012-09-28 DIAGNOSIS — S72009D Fracture of unspecified part of neck of unspecified femur, subsequent encounter for closed fracture with routine healing: Secondary | ICD-10-CM

## 2012-10-07 ENCOUNTER — Encounter: Payer: Self-pay | Admitting: Adult Health

## 2012-10-07 NOTE — Progress Notes (Signed)
  Subjective:    Patient ID: [name], female    DOB: 14-Jun-1931, 79 y.o.   MRN: [id number]  HPI This is a 79 year old female who is currently taking Metformin for Diabetes. Noted fingerstick blood sugars to be 89, 90, 92, 95, 104. Creatinine level noted to 1.61 - elevated. Patient is not complaining of dizziness.   Review of Systems  Constitutional: Negative.   HENT: Negative.   Eyes: Negative.   Respiratory: Negative for cough and shortness of breath.   Cardiovascular: Negative for leg swelling.  Gastrointestinal: Negative.   Endocrine: Negative.   Genitourinary: Negative.   Neurological: Negative.   Hematological: Negative for adenopathy. Does not bruise/bleed easily.  Psychiatric/Behavioral: Negative.        Objective:   Physical Exam  Nursing note and vitals reviewed. Constitutional: She is oriented to person, place, and time. She appears well-developed and well-nourished.  HENT:  Head: Normocephalic and atraumatic.  Right Ear: External ear normal.  Left Ear: External ear normal.  Eyes: Conjunctivae are normal. Pupils are equal, round, and reactive to light.  Neck: Normal range of motion. Neck supple. No thyromegaly present.  Cardiovascular: Normal rate.   Pulmonary/Chest: Effort normal and breath sounds normal. No respiratory distress.  Abdominal: Soft. Bowel sounds are normal. She exhibits no distension. There is no tenderness.  Musculoskeletal: She exhibits no edema and no tenderness.  Neurological: She is alert and oriented to person, place, and time.  Skin: Skin is warm and dry.  Psychiatric: She has a normal mood and affect. Her behavior is normal. Judgment and thought content normal.   LABS: 09/07/12  Wbc 6.9  hgb 11.3  hct 33.3  NA 133 K 55.4  Glucose 101  BUN 30  Creatinine 1.61    Medications reviewed per [facility]     Assessment & Plan:   DIABETES MELLITUS, TYPE II - discontinue Metformin and change CBG monitoring to daily

## 2012-11-04 ENCOUNTER — Ambulatory Visit (INDEPENDENT_AMBULATORY_CARE_PROVIDER_SITE_OTHER): Payer: Medicare Other | Admitting: Internal Medicine

## 2012-11-04 ENCOUNTER — Encounter: Payer: Self-pay | Admitting: Internal Medicine

## 2012-11-04 VITALS — BP 104/52 | HR 75 | Ht 64.0 in | Wt 116.2 lb

## 2012-11-04 DIAGNOSIS — I495 Sick sinus syndrome: Secondary | ICD-10-CM

## 2012-11-04 DIAGNOSIS — I4891 Unspecified atrial fibrillation: Secondary | ICD-10-CM

## 2012-11-04 DIAGNOSIS — Z950 Presence of cardiac pacemaker: Secondary | ICD-10-CM

## 2012-11-04 DIAGNOSIS — F329 Major depressive disorder, single episode, unspecified: Secondary | ICD-10-CM

## 2012-11-04 DIAGNOSIS — F3289 Other specified depressive episodes: Secondary | ICD-10-CM

## 2012-11-04 LAB — PACEMAKER DEVICE OBSERVATION
AL AMPLITUDE: 4 mv
AL IMPEDENCE PM: 331 Ohm
AL THRESHOLD: 0.75 V
BAMS-0001: 175 {beats}/min
BATTERY VOLTAGE: 2.75 V
RV LEAD AMPLITUDE: 15.68 mv

## 2012-11-04 NOTE — Assessment & Plan Note (Signed)
Infrequent atrial pacing-17%

## 2012-11-04 NOTE — Assessment & Plan Note (Signed)
Encouraged her to try to be proactive in speaking engagement at the nursing facility where she lives. I'm sure she is lonely with loss of her female companion

## 2012-11-04 NOTE — Addendum Note (Signed)
Addended by: Lisabeth Devoid F on: 11/04/2012 01:11 PM   Modules accepted: Orders

## 2012-11-04 NOTE — Assessment & Plan Note (Signed)
Infrequent paroxysms

## 2012-11-04 NOTE — Patient Instructions (Addendum)
Your physician recommends that you continue on your current medications as directed. Please refer to the Current Medication list given to you today.  Your physician wants you to follow-up in: 1 year with Dr. Klein.  You will receive a reminder letter in the mail two months in advance. If you don't receive a letter, please call our office to schedule the follow-up appointment.  

## 2012-11-04 NOTE — Progress Notes (Signed)
Patient Care Team: Florentina Jenny, MD as PCP - General (Family Medicine)   HPI  [name] is a 79 y.o. female is seen in followup for PAF with bradycardia and syncope requiring pacemaker.   She has diffuse moderately obstructive coronary disease with normal left ventricular function.     Last echo was 2008 demonstrated normal left ventricular function.      Past Medical History  Diagnosis Date  . Pacemaker     MDT-DDD  . Sick sinus syndrome     tachy-brady  . Atrial fibrillation   . Arthritis   . CAD (coronary artery disease)   . DM (diabetes mellitus)     type II  . Diverticulitis   . Depression     Past Surgical History  Procedure Laterality Date  . Kidney tumor    . Lung cancer surgery    . Breast biopsy    . Pacemaker insertion  ~  . Cholecystectomy      Current Outpatient Prescriptions  Medication Sig Dispense Refill  . acetaminophen (TYLENOL) 325 MG tablet Take 650 mg by mouth every 4 (four) hours as needed. FOR PAIN      . albuterol (PROVENTIL HFA;VENTOLIN HFA) 108 (90 BASE) MCG/ACT inhaler Inhale 1 puff into the lungs 4 (four) times daily.      . Calcium Citrate-Vitamin D (CITRACAL + D PO) Take 2 tablets by mouth every morning.       . carvedilol (COREG) 12.5 MG tablet Take 18.75 mg by mouth 2 (two) times daily with a meal.       . citalopram (CELEXA) 20 MG tablet Take 20 mg by mouth at bedtime.       . Estradiol 10 MCG TABS Place 1 tablet vaginally once a week. GIVEN ON WEDNESDAYS      . ferrous sulfate 325 (65 FE) MG tablet Take 325 mg by mouth daily with breakfast.      . lidocaine (XYLOCAINE) 5 % ointment Apply 1 application topically 3 (three) times daily as needed (as needed for knee pain).      . niacin (NIASPAN) 500 MG CR tablet Take 500 mg by mouth at bedtime.      Marland Kitchen oxybutynin (DITROPAN) 5 MG tablet Take 5-10 mg by mouth 2 (two) times daily. 2 @ bedtime  1 in AM      . oxyCODONE (OXY IR/ROXICODONE) 5 MG immediate release tablet Take 1 tablet  (5 mg total) by mouth at bedtime.  30 tablet  0  . pantoprazole (PROTONIX) 40 MG tablet Take 40 mg by mouth every morning.      . polyethylene glycol (MIRALAX / GLYCOLAX) packet Take 17 g by mouth every morning.      . sennosides-docusate sodium (SENOKOT-S) 8.6-50 MG tablet Take 2 tablets by mouth at bedtime.      . simethicone (MYLICON) 125 MG chewable tablet Chew 125 mg by mouth 2 (two) times daily as needed for flatulence.      . traZODone (DESYREL) 50 MG tablet Take 75 mg by mouth at bedtime.       . vitamin D, CHOLECALCIFEROL, 400 UNITS tablet Take 800 Units by mouth daily.       No current facility-administered medications for this visit.    No Known Allergies  Review of Systems negative except from HPI and PMH  Physical Exam  BP 104/52  Pulse 75  Ht 5\' 4"  (1.626 m)  Wt 116 lb 4 oz (52.731 kg)  BMI 19.94 kg/m2  Well developed and cachectic in no acute distress HENT normal Neck supple with JVP-flat Clear Device pocket well healed; without hematoma or erythema tthere is call migration of the device Regular rate and rhythm, no murmurs or gallops Abd-soft with active BS No Clubbing cyanosis edema Skin-warm and dry A & Oriented  Grossly normal sensory and motor function  ECG demonstrates sinus rhythm at 69  Intervals 21/10/47  Assessment and  Plan

## 2012-11-04 NOTE — Assessment & Plan Note (Signed)
The patient's device was interrogated.  The information was reviewed. No changes were made in the programming.    

## 2012-12-30 ENCOUNTER — Encounter: Payer: Self-pay | Admitting: Hematology & Oncology

## 2012-12-30 ENCOUNTER — Ambulatory Visit (HOSPITAL_BASED_OUTPATIENT_CLINIC_OR_DEPARTMENT_OTHER): Payer: Medicare Other | Admitting: Hematology & Oncology

## 2012-12-30 ENCOUNTER — Ambulatory Visit (HOSPITAL_BASED_OUTPATIENT_CLINIC_OR_DEPARTMENT_OTHER): Payer: Medicare Other | Admitting: Lab

## 2012-12-30 ENCOUNTER — Ambulatory Visit (HOSPITAL_BASED_OUTPATIENT_CLINIC_OR_DEPARTMENT_OTHER): Payer: Medicare Other

## 2012-12-30 DIAGNOSIS — C3492 Malignant neoplasm of unspecified part of left bronchus or lung: Secondary | ICD-10-CM

## 2012-12-30 DIAGNOSIS — D509 Iron deficiency anemia, unspecified: Secondary | ICD-10-CM

## 2012-12-30 DIAGNOSIS — Z85118 Personal history of other malignant neoplasm of bronchus and lung: Secondary | ICD-10-CM

## 2012-12-30 DIAGNOSIS — C3490 Malignant neoplasm of unspecified part of unspecified bronchus or lung: Secondary | ICD-10-CM

## 2012-12-30 HISTORY — DX: Malignant neoplasm of unspecified part of unspecified bronchus or lung: C34.90

## 2012-12-30 HISTORY — DX: Iron deficiency anemia, unspecified: D50.9

## 2012-12-30 LAB — COMPREHENSIVE METABOLIC PANEL
AST: 14 U/L (ref 0–37)
Alkaline Phosphatase: 53 U/L (ref 39–117)
BUN: 17 mg/dL (ref 6–23)
Creatinine, Ser: 1.17 mg/dL — ABNORMAL HIGH (ref 0.50–1.10)
Glucose, Bld: 89 mg/dL (ref 70–99)
Total Bilirubin: 0.3 mg/dL (ref 0.3–1.2)

## 2012-12-30 LAB — CBC WITH DIFFERENTIAL (CANCER CENTER ONLY)
BASO#: 0 10*3/uL (ref 0.0–0.2)
BASO%: 0.5 % (ref 0.0–2.0)
EOS%: 2.8 % (ref 0.0–7.0)
HCT: 33.2 % — ABNORMAL LOW (ref 34.8–46.6)
HGB: 10.8 g/dL — ABNORMAL LOW (ref 11.6–15.9)
LYMPH#: 1 10*3/uL (ref 0.9–3.3)
MCH: 29.8 pg (ref 26.0–34.0)
MCHC: 32.5 g/dL (ref 32.0–36.0)
MONO%: 6.5 % (ref 0.0–13.0)
NEUT%: 77.3 % (ref 39.6–80.0)
RDW: 13.4 % (ref 11.1–15.7)

## 2012-12-30 MED ORDER — SODIUM CHLORIDE 0.9 % IV SOLN
Freq: Once | INTRAVENOUS | Status: AC
  Administered 2012-12-30: 13:00:00 via INTRAVENOUS

## 2012-12-30 MED ORDER — SODIUM CHLORIDE 0.9 % IV SOLN
1020.0000 mg | Freq: Once | INTRAVENOUS | Status: AC
  Administered 2012-12-30: 1020 mg via INTRAVENOUS
  Filled 2012-12-30: qty 34

## 2012-12-30 NOTE — Progress Notes (Signed)
This office note has been dictated.

## 2012-12-30 NOTE — Patient Instructions (Addendum)
Ferumoxytol injection What is this medicine? FERUMOXYTOL is an iron complex. Iron is used to make healthy red blood cells, which carry oxygen and nutrients throughout the body. This medicine is used to treat iron deficiency anemia in people with chronic kidney disease. This medicine may be used for other purposes; ask your health care provider or pharmacist if you have questions. What should I tell my health care provider before I take this medicine? They need to know if you have any of these conditions: -anemia not caused by low iron levels -high levels of iron in the blood -magnetic resonance imaging (MRI) test scheduled -an unusual or allergic reaction to iron, other medicines, foods, dyes, or preservatives -pregnant or trying to get pregnant -breast-feeding How should I use this medicine? This medicine is for infusion into a vein. It is given by a health care professional in a hospital or clinic setting. Talk to your pediatrician regarding the use of this medicine in children. Special care may be needed. Overdosage: If you think you've taken too much of this medicine contact a poison control center or emergency room at once. Overdosage: If you think you have taken too much of this medicine contact a poison control center or emergency room at once. NOTE: This medicine is only for you. Do not share this medicine with others. What if I miss a dose? It is important not to miss your dose. Call your doctor or health care professional if you are unable to keep an appointment. What may interact with this medicine? This medicine may interact with the following medications: -other iron products This list may not describe all possible interactions. Give your health care provider a list of all the medicines, herbs, non-prescription drugs, or dietary supplements you use. Also tell them if you smoke, drink alcohol, or use illegal drugs. Some items may interact with your medicine. What should I watch  for while using this medicine? Visit your doctor or healthcare professional regularly. Tell your doctor or healthcare professional if your symptoms do not start to get better or if they get worse. You may need blood work done while you are taking this medicine. You may need to follow a special diet. Talk to your doctor. Foods that contain iron include: whole grains/cereals, dried fruits, beans, or peas, leafy green vegetables, and organ meats (liver, kidney). What side effects may I notice from receiving this medicine? Side effects that you should report to your doctor or health care professional as soon as possible: -allergic reactions like skin rash, itching or hives, swelling of the face, lips, or tongue -breathing problems -changes in blood pressure -feeling faint or lightheaded, falls -fever or chills -flushing, sweating, or hot feelings -swelling of the ankles or feet Side effects that usually do not require medical attention (Report these to your doctor or health care professional if they continue or are bothersome.): -diarrhea -headache -nausea, vomiting -stomach pain This list may not describe all possible side effects. Call your doctor for medical advice about side effects. You may report side effects to FDA at 1-800-FDA-1088. Where should I keep my medicine? This drug is given in a hospital or clinic and will not be stored at home. NOTE: This sheet is a summary. It may not cover all possible information. If you have questions about this medicine, talk to your doctor, pharmacist, or health care provider.  2013, Elsevier/Gold Standard. (01/18/2008 9:48:25 PM)  

## 2012-12-31 NOTE — Progress Notes (Signed)
CC:   Barbara Jenny, MD  DIAGNOSIS: 1. History of limited-stage small-cell lung cancer, clinical     remission. 2. Nondisplaced fracture of the left hip. 3. Progressive neurologic decline.  CURRENT THERAPY:  Observation.  INTERIM HISTORY:  [name] comes in for followup.  She is doing okay. We last saw her back in February.  Since then, she fell at the nursing home and sustained a minimally displaced fracture of the left hip.  She did not require any surgery for this.  She was just supported.  I think she had, I think, a brace put on.  She then went to a rehabilitation center.  She is now back at her assisted-living center.  She is doing fairly well out there.  She does see Dr. Graciela Husbands of Cardiology.  She has history of paroxysmal atrial fibrillation.  She has a pacemaker in place.  Again, at the assisted-living facility she is doing okay.  She does not like the food there all that much.  It is hard to say how much she is eating.  She does not seem to have any pain right now.  PHYSICAL EXAM:  General:  This is an elderly, thin white female in no obvious distress.  Vital Signs:  Show a temperature of 98.1, pulse 64, respiratory rate 14, blood pressure 95/60.  Weight is 117.  Head and Neck:  Show a normocephalic, atraumatic skull.  There are no ocular or oral lesions.  There are no palpable cervical or supraclavicular lymph nodes.  Lungs:  Clear bilaterally.  Cardiac:  Regular rate and rhythm with a normal S1 and S2.  There are no murmurs, rubs, or bruits. Abdomen:  Soft.  She has good bowel sounds.  There is no fluid wave. There is no palpable hepatosplenomegaly.  Extremities:  Show no clubbing, cyanosis, or edema.  Skin:  No rashes, ecchymosis, or petechia.  LABORATORY STUDIES:  White cell count is 7.9.  Hemoglobin 10.8, hematocrit 33.2, platelet count 127.  IMPRESSION:  [name] is a 79 year old white female with history of limited-stage small-cell lung cancer.  She has  been in remission from this for probably close to 8 or 9 years.  I believe she is cured of this.  Her prognosis will be based on the cardiac status in my mind.  She is a little bit more anemic today.  I noticed that back in April, she was iron deficient.  We will see about trying to give her some iron today if possible.  I will plan to get her back in 3 more months now for followup.    ______________________________ Josph Macho, M.D. PRE/MEDQ  D:  12/30/2012  T:  12/31/2012  Job:  [id number]

## 2013-02-06 ENCOUNTER — Encounter: Payer: Medicare Other | Admitting: *Deleted

## 2013-02-10 ENCOUNTER — Telehealth: Payer: Self-pay | Admitting: Internal Medicine

## 2013-02-10 NOTE — Telephone Encounter (Signed)
Patient now in Assisted Living @ [facility].  Spoke with nurse, Cherlynn June and reviewed procedure for transmitting since last attempt was not received.  She will resend later today.

## 2013-02-10 NOTE — Telephone Encounter (Signed)
Pt's transmission was sent on Wednesday by her nursing facility, wants to know if received? pls call [phone] rose

## 2013-02-13 ENCOUNTER — Telehealth: Payer: Self-pay | Admitting: Internal Medicine

## 2013-02-13 NOTE — Telephone Encounter (Signed)
Rose, pt's nurse, attempted to transmit [facility] but was unsuccessful.  She was able to successfully transmit another patient in same facility also with [facility].  Rose will try again tomorrow.  If transmission fails, I will give her pt svcs #.

## 2013-02-13 NOTE — Telephone Encounter (Signed)
New Problem  Pt nurse calling to see if the signal was received. Friday 10/3 at 5pm// request a call back to confirm that the message was received.

## 2013-02-15 ENCOUNTER — Encounter: Payer: Self-pay | Admitting: *Deleted

## 2013-02-27 ENCOUNTER — Ambulatory Visit (INDEPENDENT_AMBULATORY_CARE_PROVIDER_SITE_OTHER): Payer: Medicare Other

## 2013-02-27 ENCOUNTER — Encounter: Payer: Self-pay | Admitting: Internal Medicine

## 2013-02-27 DIAGNOSIS — Z950 Presence of cardiac pacemaker: Secondary | ICD-10-CM

## 2013-02-27 DIAGNOSIS — I4891 Unspecified atrial fibrillation: Secondary | ICD-10-CM

## 2013-03-02 ENCOUNTER — Telehealth: Payer: Self-pay | Admitting: Internal Medicine

## 2013-03-02 NOTE — Telephone Encounter (Signed)
New problem:  Pt's caregiver Rose from [facility] states she is checking to see if we ever received a remote transmission. Rose states she has tried 4 or 5 times to send one. Rose would like to be advised on what to do if we have not yet received the transmission. Please advise

## 2013-03-03 NOTE — Telephone Encounter (Signed)
Spoke with Rose, let her know transmission was successful on 02/27/13.

## 2013-03-06 LAB — REMOTE PACEMAKER DEVICE
ATRIAL PACING PM: 8
BAMS-0001: 175 {beats}/min
RV LEAD IMPEDENCE PM: 493 Ohm
RV LEAD THRESHOLD: 0.75 V
VENTRICULAR PACING PM: 0

## 2013-03-17 ENCOUNTER — Encounter: Payer: Self-pay | Admitting: *Deleted

## 2013-03-31 ENCOUNTER — Ambulatory Visit (HOSPITAL_BASED_OUTPATIENT_CLINIC_OR_DEPARTMENT_OTHER): Payer: Medicare Other | Admitting: Hematology & Oncology

## 2013-03-31 ENCOUNTER — Other Ambulatory Visit (HOSPITAL_BASED_OUTPATIENT_CLINIC_OR_DEPARTMENT_OTHER): Payer: Medicare Other | Admitting: Lab

## 2013-03-31 VITALS — BP 121/50 | HR 65 | Temp 97.4°F | Resp 14 | Ht 64.0 in | Wt 122.0 lb

## 2013-03-31 DIAGNOSIS — Z85118 Personal history of other malignant neoplasm of bronchus and lung: Secondary | ICD-10-CM

## 2013-03-31 DIAGNOSIS — D696 Thrombocytopenia, unspecified: Secondary | ICD-10-CM

## 2013-03-31 DIAGNOSIS — C3491 Malignant neoplasm of unspecified part of right bronchus or lung: Secondary | ICD-10-CM

## 2013-03-31 DIAGNOSIS — D509 Iron deficiency anemia, unspecified: Secondary | ICD-10-CM

## 2013-03-31 DIAGNOSIS — C3492 Malignant neoplasm of unspecified part of left bronchus or lung: Secondary | ICD-10-CM

## 2013-03-31 LAB — CMP (CANCER CENTER ONLY)
AST: 22 U/L (ref 11–38)
BUN, Bld: 17 mg/dL (ref 7–22)
Calcium: 9.4 mg/dL (ref 8.0–10.3)
Chloride: 101 mEq/L (ref 98–108)
Creat: 1 mg/dl (ref 0.6–1.2)
Glucose, Bld: 100 mg/dL (ref 73–118)
Potassium: 4.6 mEq/L (ref 3.3–4.7)
Total Bilirubin: 0.7 mg/dl (ref 0.20–1.60)

## 2013-03-31 LAB — CBC WITH DIFFERENTIAL (CANCER CENTER ONLY)
BASO%: 0.5 % (ref 0.0–2.0)
HCT: 34.8 % (ref 34.8–46.6)
HGB: 11.7 g/dL (ref 11.6–15.9)
LYMPH#: 1.1 10*3/uL (ref 0.9–3.3)
LYMPH%: 17.6 % (ref 14.0–48.0)
MCV: 94 fL (ref 81–101)
MONO#: 0.5 10*3/uL (ref 0.1–0.9)
MONO%: 8 % (ref 0.0–13.0)
NEUT#: 4.3 10*3/uL (ref 1.5–6.5)
Platelets: 118 10*3/uL — ABNORMAL LOW (ref 145–400)
RBC: 3.72 10*6/uL (ref 3.70–5.32)
RDW: 13.7 % (ref 11.1–15.7)
WBC: 6.1 10*3/uL (ref 3.9–10.0)

## 2013-03-31 LAB — IRON AND TIBC CHCC
%SAT: 36 % (ref 21–57)
Iron: 82 ug/dL (ref 41–142)
TIBC: 230 ug/dL — ABNORMAL LOW (ref 236–444)

## 2013-03-31 NOTE — Progress Notes (Signed)
This office note has been dictated.

## 2013-04-02 NOTE — Progress Notes (Signed)
CC:   Florentina Jenny, MD  DIAGNOSIS: 1. Limited stage small-cell lung cancer -- remission. 2. Neurological dysfunction.  CURRENT THERAPY:  Observation.  INTERIM HISTORY:  [name] comes in for followup.  We see her every 4- 6 months.  Since we last saw her, she has been doing okay.  She has had no problems since we last saw her.  She still is at the Assisted Living Center.  She is eating okay.  She does not eat all that much, according to her daughter.  The patient has not had any problems with the heart.  She does have a pacemaker in.  She is followed by Dr. Sherryl Manges for this.  She continues on her medications, which are quite extensive.  She has had no bleeding or bruising.  There has been no change in bowel or bladder habits.  She has had no rashes.  She has this neurological disorder, which appears to be relatively stable.  There may be some element of dementia.  PHYSICAL EXAMINATION:  General:  This is an elderly white female, in no obvious distress.  Vital Signs:  Temperature of 99, pulse 82, respiratory rate 18, blood pressure 121/50.  Weight is 122 pounds.  Head and Neck:  Normocephalic, atraumatic skull.  There are no ocular or oral lesions.  There are no palpable cervical or supraclavicular lymph nodes. She has a tongue-rolling phenomenon.  No adenopathy noted in the neck. Thyroid is not palpable.  Lungs:  With slight decrease at the bases. She has good breath sounds bilaterally.  Cardiac:  Regular rate and rhythm, with no murmurs, rubs, or bruits.  Abdomen:  Soft.  She has good bowel sounds.  There is no fluid wave.  There is no palpable abdominal mass.  No palpable hepatosplenomegaly.  Back:  Shows some slight kyphosis.  She has no tenderness over the spine, ribs, or hips. Extremities:  Show no clubbing, cyanosis, or edema.  She has decreased strength bilaterally in upper and lower extremities.  Neurological: Shows no focal neurological deficits.  LABORATORY  STUDIES:  White cell count is 6.1, hemoglobin 11.7, hematocrit 34.8, platelet count 118.  MCV is 94.  IMPRESSION:  [name] is a very charming 79 year old white female with a remote history of limited-stage small-cell lung cancer.  She underwent chemo and radiation therapy.  She did have prophylactic cranial radiation.  I have noticed the thrombocytopenia.  I looked at her blood smear.  I did not see anything that looked suspicious from my point of view.  I think we can probably still get her back in about 6 months.  I think we can just follow this along.  Again, she is asymptomatic with the thrombocytopenia.  Her performance status is marginal, so I could not imagine [place] doing anything aggressive.    ______________________________ Josph Macho, M.D. PRE/MEDQ  D:  03/31/2013  T:  04/01/2013  Job:  [id number]

## 2013-06-02 ENCOUNTER — Encounter: Payer: Medicare Other | Admitting: *Deleted

## 2013-06-06 ENCOUNTER — Encounter: Payer: Self-pay | Admitting: *Deleted

## 2013-09-29 ENCOUNTER — Encounter: Payer: Self-pay | Admitting: Hematology & Oncology

## 2013-09-29 ENCOUNTER — Other Ambulatory Visit (HOSPITAL_BASED_OUTPATIENT_CLINIC_OR_DEPARTMENT_OTHER): Payer: Medicare Other | Admitting: Lab

## 2013-09-29 ENCOUNTER — Ambulatory Visit (HOSPITAL_BASED_OUTPATIENT_CLINIC_OR_DEPARTMENT_OTHER): Payer: Medicare Other | Admitting: Hematology & Oncology

## 2013-09-29 VITALS — BP 106/42 | HR 67 | Temp 97.4°F | Resp 14 | Ht 64.0 in | Wt 121.0 lb

## 2013-09-29 DIAGNOSIS — Z85118 Personal history of other malignant neoplasm of bronchus and lung: Secondary | ICD-10-CM

## 2013-09-29 DIAGNOSIS — D696 Thrombocytopenia, unspecified: Secondary | ICD-10-CM

## 2013-09-29 DIAGNOSIS — C3491 Malignant neoplasm of unspecified part of right bronchus or lung: Secondary | ICD-10-CM

## 2013-09-29 DIAGNOSIS — C3490 Malignant neoplasm of unspecified part of unspecified bronchus or lung: Secondary | ICD-10-CM

## 2013-09-29 DIAGNOSIS — D509 Iron deficiency anemia, unspecified: Secondary | ICD-10-CM

## 2013-09-29 DIAGNOSIS — D649 Anemia, unspecified: Secondary | ICD-10-CM

## 2013-09-29 DIAGNOSIS — I2510 Atherosclerotic heart disease of native coronary artery without angina pectoris: Secondary | ICD-10-CM

## 2013-09-29 LAB — CBC WITH DIFFERENTIAL (CANCER CENTER ONLY)
BASO#: 0 10*3/uL (ref 0.0–0.2)
BASO%: 0.3 % (ref 0.0–2.0)
EOS%: 2.3 % (ref 0.0–7.0)
Eosinophils Absolute: 0.2 10*3/uL (ref 0.0–0.5)
HCT: 34.2 % — ABNORMAL LOW (ref 34.8–46.6)
HGB: 11.4 g/dL — ABNORMAL LOW (ref 11.6–15.9)
LYMPH#: 0.8 10*3/uL — ABNORMAL LOW (ref 0.9–3.3)
LYMPH%: 11.4 % — AB (ref 14.0–48.0)
MCH: 30.6 pg (ref 26.0–34.0)
MCHC: 33.3 g/dL (ref 32.0–36.0)
MCV: 92 fL (ref 81–101)
MONO#: 0.4 10*3/uL (ref 0.1–0.9)
MONO%: 6 % (ref 0.0–13.0)
NEUT#: 5.8 10*3/uL (ref 1.5–6.5)
NEUT%: 80 % (ref 39.6–80.0)
PLATELETS: 113 10*3/uL — AB (ref 145–400)
RBC: 3.73 10*6/uL (ref 3.70–5.32)
RDW: 13.3 % (ref 11.1–15.7)
WBC: 7.3 10*3/uL (ref 3.9–10.0)

## 2013-09-29 LAB — CMP (CANCER CENTER ONLY)
ALT(SGPT): 11 U/L (ref 10–47)
AST: 17 U/L (ref 11–38)
Albumin: 3.4 g/dL (ref 3.3–5.5)
Alkaline Phosphatase: 47 U/L (ref 26–84)
BUN: 15 mg/dL (ref 7–22)
CALCIUM: 8.8 mg/dL (ref 8.0–10.3)
CHLORIDE: 99 meq/L (ref 98–108)
CO2: 29 mEq/L (ref 18–33)
Creat: 1.2 mg/dl (ref 0.6–1.2)
GLUCOSE: 93 mg/dL (ref 73–118)
POTASSIUM: 3.6 meq/L (ref 3.3–4.7)
SODIUM: 140 meq/L (ref 128–145)
TOTAL PROTEIN: 7.2 g/dL (ref 6.4–8.1)
Total Bilirubin: 0.6 mg/dl (ref 0.20–1.60)

## 2013-09-29 LAB — IRON AND TIBC CHCC
%SAT: 30 % (ref 21–57)
IRON: 71 ug/dL (ref 41–142)
TIBC: 236 ug/dL (ref 236–444)
UIBC: 166 ug/dL (ref 120–384)

## 2013-09-29 LAB — CHCC SATELLITE - SMEAR

## 2013-09-29 LAB — FERRITIN CHCC: Ferritin: 111 ng/ml (ref 9–269)

## 2013-09-29 MED ORDER — CARVEDILOL 12.5 MG PO TABS: 12.5000 mg | ORAL_TABLET | Freq: Two times a day (BID) | ORAL | Status: DC

## 2013-09-29 NOTE — Progress Notes (Signed)
CC:   Reymundo Poll, MD  DIAGNOSIS: 1. Limited stage small-cell lung cancer -- remission. 2. Neurological dysfunction.  CURRENT THERAPY:  Observation.  INTERIM HISTORY:  Ms. Cerro comes in for followup.  We see her every 4- 6 months.  Since we last saw her, she has been doing okay.  She has had no problems since we last saw her.  She still is at the [facility].  She is eating okay.  Her appetite has been doing a little better. She's had no nausea or vomiting. daughter.  The patient has not had any problems with the heart.  She does have a pacemaker in.  She is followed by Dr. Virl Axe for this.  She has had no bleeding or bruising.  There has been no change in bowel or bladder habits.  She has had no rashes.  She has this neurological disorder, which appears to be relatively stable.  There may be some element of dementia.  PHYSICAL EXAMINATION:  General:  This is an elderly white female, in no obvious distress.  Vital Signs:  Temperature of 97.4, pulse 67 respiratory rate 18, blood pressure 106/42.  Weight is 121 pounds.  Head and Neck:  Normocephalic, atraumatic skull.  There are no ocular or oral lesions.  There are no palpable cervical or supraclavicular lymph nodes. She has a tongue-rolling phenomenon.  No adenopathy noted in the neck. Thyroid is not palpable.  Lungs:  With slight decrease at the bases. She has good breath sounds bilaterally.  Cardiac:  Regular rate and rhythm, with no murmurs, rubs, or bruits.  Abdomen:  Soft.  She has good bowel sounds.  There is no fluid wave.  There is no palpable abdominal mass.  No palpable hepatosplenomegaly.  Back:  Shows some slight kyphosis.  She has no tenderness over the spine, ribs, or hips. Extremities:  Show no clubbing, cyanosis, or edema.  She has decreased strength bilaterally in upper and lower extremities.  Neurological: Shows no focal neurological deficits.  LABORATORY STUDIES:  White cell count is 7.3,  hemoglobin 11.4, hematocrit 34.2, platelet count 113.  MCV is 94.  IMPRESSION:  Ms. Drenning is a very charming 80 year old white female with a remote history of limited-stage small-cell lung cancer.  She underwent chemo and radiation therapy.  She did have prophylactic cranial radiation.   I have noticed the thrombocytopenia. This is holding relatively stable right now. She is a symptomatically with this.  I looked at her blood smear.  I did not see anything that looked suspicious from my point of view.  I think we can probably still get her back in about 6 months.  I think we can just follow this along.  Again, she is asymptomatic with the thrombocytopenia.  Her performance status is marginal, so I could not imagine [place] doing anything aggressive.  Her blood pressure still on the low side. I think that her medications can probably be adjusted a little bit.

## 2013-10-10 ENCOUNTER — Encounter: Payer: Self-pay | Admitting: Cardiology

## 2013-10-30 ENCOUNTER — Emergency Department (HOSPITAL_COMMUNITY): Payer: Medicare Other

## 2013-10-30 ENCOUNTER — Encounter (HOSPITAL_COMMUNITY): Payer: Self-pay | Admitting: Emergency Medicine

## 2013-10-30 ENCOUNTER — Emergency Department (HOSPITAL_COMMUNITY)
Admission: EM | Admit: 2013-10-30 | Discharge: 2013-10-30 | Disposition: A | Discharge status: Skilled Nursing Facility | Payer: Medicare Other | Attending: Emergency Medicine | Admitting: Emergency Medicine

## 2013-10-30 DIAGNOSIS — Z950 Presence of cardiac pacemaker: Secondary | ICD-10-CM | POA: Insufficient documentation

## 2013-10-30 DIAGNOSIS — Y9389 Activity, other specified: Secondary | ICD-10-CM | POA: Insufficient documentation

## 2013-10-30 DIAGNOSIS — Z87891 Personal history of nicotine dependence: Secondary | ICD-10-CM | POA: Insufficient documentation

## 2013-10-30 DIAGNOSIS — Y9210 Unspecified residential institution as the place of occurrence of the external cause: Secondary | ICD-10-CM | POA: Insufficient documentation

## 2013-10-30 DIAGNOSIS — W010XXA Fall on same level from slipping, tripping and stumbling without subsequent striking against object, initial encounter: Secondary | ICD-10-CM | POA: Insufficient documentation

## 2013-10-30 DIAGNOSIS — I951 Orthostatic hypotension: Secondary | ICD-10-CM

## 2013-10-30 DIAGNOSIS — Z85118 Personal history of other malignant neoplasm of bronchus and lung: Secondary | ICD-10-CM | POA: Insufficient documentation

## 2013-10-30 DIAGNOSIS — Z8719 Personal history of other diseases of the digestive system: Secondary | ICD-10-CM | POA: Insufficient documentation

## 2013-10-30 DIAGNOSIS — F3289 Other specified depressive episodes: Secondary | ICD-10-CM | POA: Insufficient documentation

## 2013-10-30 DIAGNOSIS — Z862 Personal history of diseases of the blood and blood-forming organs and certain disorders involving the immune mechanism: Secondary | ICD-10-CM | POA: Insufficient documentation

## 2013-10-30 DIAGNOSIS — S4980XA Other specified injuries of shoulder and upper arm, unspecified arm, initial encounter: Secondary | ICD-10-CM | POA: Insufficient documentation

## 2013-10-30 DIAGNOSIS — S46909A Unspecified injury of unspecified muscle, fascia and tendon at shoulder and upper arm level, unspecified arm, initial encounter: Secondary | ICD-10-CM | POA: Insufficient documentation

## 2013-10-30 DIAGNOSIS — W19XXXA Unspecified fall, initial encounter: Secondary | ICD-10-CM

## 2013-10-30 DIAGNOSIS — E119 Type 2 diabetes mellitus without complications: Secondary | ICD-10-CM | POA: Insufficient documentation

## 2013-10-30 DIAGNOSIS — I2510 Atherosclerotic heart disease of native coronary artery without angina pectoris: Secondary | ICD-10-CM | POA: Insufficient documentation

## 2013-10-30 DIAGNOSIS — F329 Major depressive disorder, single episode, unspecified: Secondary | ICD-10-CM | POA: Insufficient documentation

## 2013-10-30 DIAGNOSIS — I4891 Unspecified atrial fibrillation: Secondary | ICD-10-CM | POA: Insufficient documentation

## 2013-10-30 DIAGNOSIS — M129 Arthropathy, unspecified: Secondary | ICD-10-CM | POA: Insufficient documentation

## 2013-10-30 DIAGNOSIS — N390 Urinary tract infection, site not specified: Secondary | ICD-10-CM

## 2013-10-30 LAB — CBC WITH DIFFERENTIAL/PLATELET
BASOS PCT: 0 % (ref 0–1)
Basophils Absolute: 0 10*3/uL (ref 0.0–0.1)
Eosinophils Absolute: 0.2 10*3/uL (ref 0.0–0.7)
Eosinophils Relative: 3 % (ref 0–5)
HEMATOCRIT: 32.2 % — AB (ref 36.0–46.0)
HEMOGLOBIN: 10.8 g/dL — AB (ref 12.0–15.0)
LYMPHS ABS: 1 10*3/uL (ref 0.7–4.0)
LYMPHS PCT: 17 % (ref 12–46)
MCH: 29.8 pg (ref 26.0–34.0)
MCHC: 33.5 g/dL (ref 30.0–36.0)
MCV: 89 fL (ref 78.0–100.0)
Monocytes Absolute: 0.4 10*3/uL (ref 0.1–1.0)
Monocytes Relative: 6 % (ref 3–12)
Neutro Abs: 4.3 10*3/uL (ref 1.7–7.7)
Neutrophils Relative %: 74 % (ref 43–77)
Platelets: 91 10*3/uL — ABNORMAL LOW (ref 150–400)
RBC: 3.62 MIL/uL — ABNORMAL LOW (ref 3.87–5.11)
RDW: 13.4 % (ref 11.5–15.5)
WBC: 5.9 10*3/uL (ref 4.0–10.5)

## 2013-10-30 LAB — COMPREHENSIVE METABOLIC PANEL
ALK PHOS: 51 U/L (ref 39–117)
ALT: 7 U/L (ref 0–35)
AST: 16 U/L (ref 0–37)
Albumin: 3.4 g/dL — ABNORMAL LOW (ref 3.5–5.2)
BUN: 20 mg/dL (ref 6–23)
CO2: 22 mEq/L (ref 19–32)
CREATININE: 1.19 mg/dL — AB (ref 0.50–1.10)
Calcium: 8.7 mg/dL (ref 8.4–10.5)
Chloride: 103 mEq/L (ref 96–112)
GFR calc non Af Amer: 42 mL/min — ABNORMAL LOW (ref >90)
GFR, EST AFRICAN AMERICAN: 49 mL/min — AB (ref >90)
GLUCOSE: 108 mg/dL — AB (ref 70–99)
POTASSIUM: 3.8 meq/L (ref 3.7–5.3)
Sodium: 140 mEq/L (ref 137–147)
Total Protein: 6.9 g/dL (ref 6.0–8.3)

## 2013-10-30 LAB — URINE MICROSCOPIC-ADD ON

## 2013-10-30 LAB — URINALYSIS, ROUTINE W REFLEX MICROSCOPIC
BILIRUBIN URINE: NEGATIVE
GLUCOSE, UA: NEGATIVE mg/dL
Hgb urine dipstick: NEGATIVE
Ketones, ur: NEGATIVE mg/dL
NITRITE: POSITIVE — AB
PH: 5.5 (ref 5.0–8.0)
PROTEIN: NEGATIVE mg/dL
Specific Gravity, Urine: 1.017 (ref 1.005–1.030)
Urobilinogen, UA: 0.2 mg/dL (ref 0.0–1.0)

## 2013-10-30 LAB — TROPONIN I

## 2013-10-30 MED ORDER — CEPHALEXIN 250 MG PO CAPS: 250.0000 mg | ORAL_CAPSULE | Freq: Four times a day (QID) | ORAL | Status: DC

## 2013-10-30 MED ORDER — SODIUM CHLORIDE 0.9 % IV BOLUS (SEPSIS)
500.0000 mL | Freq: Once | INTRAVENOUS | Status: AC
  Administered 2013-10-30: 500 mL via INTRAVENOUS

## 2013-10-30 NOTE — Discharge Instructions (Signed)
Fall Prevention and Home Safety Falls cause injuries and can affect all age groups. It is possible to use preventive measures to significantly decrease the likelihood of falls. There are many simple measures which can make your home safer and prevent falls. OUTDOORS  Repair cracks and edges of walkways and driveways.  Remove high doorway thresholds.  Trim shrubbery on the main path into your home.  Have good outside lighting.  Clear walkways of tools, rocks, debris, and clutter.  Check that handrails are not broken and are securely fastened. Both sides of steps should have handrails.  Have leaves, snow, and ice cleared regularly.  Use sand or salt on walkways during winter months.  In the garage, clean up grease or oil spills. BATHROOM  Install night lights.  Install grab bars by the toilet and in the tub and shower.  Use non-skid mats or decals in the tub or shower.  Place a plastic non-slip stool in the shower to sit on, if needed.  Keep floors dry and clean up all water on the floor immediately.  Remove soap buildup in the tub or shower on a regular basis.  Secure bath mats with non-slip, double-sided rug tape.  Remove throw rugs and tripping hazards from the floors. BEDROOMS  Install night lights.  Make sure a bedside light is easy to reach.  Do not use oversized bedding.  Keep a telephone by your bedside.  Have a firm chair with side arms to use for getting dressed.  Remove throw rugs and tripping hazards from the floor. KITCHEN  Keep handles on pots and pans turned toward the center of the stove. Use back burners when possible.  Clean up spills quickly and allow time for drying.  Avoid walking on wet floors.  Avoid hot utensils and knives.  Position shelves so they are not too high or low.  Place commonly used objects within easy reach.  If necessary, use a sturdy step stool with a grab bar when reaching.  Keep electrical cables out of the  way.  Do not use floor polish or wax that makes floors slippery. If you must use wax, use non-skid floor wax.  Remove throw rugs and tripping hazards from the floor. STAIRWAYS  Never leave objects on stairs.  Place handrails on both sides of stairways and use them. Fix any loose handrails. Make sure handrails on both sides of the stairways are as long as the stairs.  Check carpeting to make sure it is firmly attached along stairs. Make repairs to worn or loose carpet promptly.  Avoid placing throw rugs at the top or bottom of stairways, or properly secure the rug with carpet tape to prevent slippage. Get rid of throw rugs, if possible.  Have an electrician put in a light switch at the top and bottom of the stairs. OTHER FALL PREVENTION TIPS  Wear low-heel or rubber-soled shoes that are supportive and fit well. Wear closed toe shoes.  When using a stepladder, make sure it is fully opened and both spreaders are firmly locked. Do not climb a closed stepladder.  Add color or contrast paint or tape to grab bars and handrails in your home. Place contrasting color strips on first and last steps.  Learn and use mobility aids as needed. Install an electrical emergency response system.  Turn on lights to avoid dark areas. Replace light bulbs that burn out immediately. Get light switches that glow.  Arrange furniture to create clear pathways. Keep furniture in the same place.  Firmly attach carpet with non-skid or double-sided tape.  Eliminate uneven floor surfaces.  Select a carpet pattern that does not visually hide the edge of steps.  Be aware of all pets. OTHER HOME SAFETY TIPS  Set the water temperature for 120 F (48.8 C).  Keep emergency numbers on or near the telephone.  Keep smoke detectors on every level of the home and near sleeping areas. Document Released: 04/17/2002 Document Revised: 10/27/2011 Document Reviewed: 07/17/2011 [facility] Patient Information 2015  [place], [place]. This information is not intended to replace advice given to you by your health care provider. Make sure you discuss any questions you have with your health care provider.  Orthostatic Hypotension Orthostatic hypotension is a sudden drop in blood pressure. It happens when you quickly stand up from a seated or lying position. You may feel dizzy or light-headed. This can last for just a few seconds or for up to a few minutes. It is usually not a serious problem. However, if this happens frequently or gets worse, it can be a sign of something more serious. CAUSES  Different things can cause orthostatic hypotension, including:   Loss of body fluids (dehydration).  Medicines that lower blood pressure.  Sudden changes in posture, such as standing up quickly after you have been sitting or lying down.  Taking too much of your medicine. SIGNS AND SYMPTOMS   Light-headedness or dizziness.   Fainting or near-fainting.   A fast heart rate.   Weakness.   Feeling tired (fatigue).  DIAGNOSIS  Your health care provider may do several things to help diagnose your condition and identify the cause. These may include:   Taking a medical history and doing a physical exam.  Checking your blood pressure. Your health care provider will check your blood pressure when you are:  Lying down.  Sitting.  Standing.  Using tilt table testing. In this test, you lie down on a table that moves from a lying position to a standing position. You will be strapped onto the table. This test monitors your blood pressure and heart rate when you are in different positions. TREATMENT  Treatment will vary depending on the cause. Possible treatments include:   Changing the dosage of your medicines.  Wearing compression stockings on your lower legs.  Standing up slowly after sitting or lying down.  Eating more salt.  Eating frequent, small meals.  In some cases, getting IV fluids.  Taking  medicine to enhance fluid retention. HOME CARE INSTRUCTIONS  Only take over-the-counter or prescription medicines as directed by your health care provider.  Follow your health care provider's instructions for changing the dosage of your current medicines.  Do not stop or adjust your medicine on your own.  Stand up slowly after sitting or lying down. This allows your body to adjust to the different position.  Wear compression stockings as directed.  Eat extra salt as directed.  Do not add extra salt to your diet unless directed to by your health care provider.  Eat frequent, small meals.  Avoid standing suddenly after eating.  Avoid hot showers or excessive heat as directed by your health care provider.  Keep all follow-up appointments. SEEK MEDICAL CARE IF:  You continue to feel dizzy or light-headed after standing.  You feel groggy or confused.  You feel cold, clammy, or sick to your stomach (nauseous).  You have blurred vision.  You feel short of breath. SEEK IMMEDIATE MEDICAL CARE IF:   You faint after standing.  You  have chest pain.  You have difficulty breathing.   You lose feeling or movement in your arms or legs.   You have slurred speech or difficulty talking, or you are unable to talk.  MAKE SURE YOU:   Understand these instructions.  Will watch your condition.  Will get help right away if you are not doing well or get worse. Document Released: 04/17/2002 Document Revised: 05/02/2013 Document Reviewed: 02/17/2013 [facility] Patient Information 2015 [place], [place]. This information is not intended to replace advice given to you by your health care provider. Make sure you discuss any questions you have with your health care provider.  Urinary Tract Infection Urinary tract infections (UTIs) can develop anywhere along your urinary tract. Your urinary tract is your body's drainage system for removing wastes and extra water. Your urinary tract includes  two kidneys, two ureters, a bladder, and a urethra. Your kidneys are a pair of bean-shaped organs. Each kidney is about the size of your fist. They are located below your ribs, one on each side of your spine. CAUSES Infections are caused by microbes, which are microscopic organisms, including fungi, viruses, and bacteria. These organisms are so small that they can only be seen through a microscope. Bacteria are the microbes that most commonly cause UTIs. SYMPTOMS  Symptoms of UTIs may vary by age and gender of the patient and by the location of the infection. Symptoms in young women typically include a frequent and intense urge to urinate and a painful, burning feeling in the bladder or urethra during urination. Older women and men are more likely to be tired, shaky, and weak and have muscle aches and abdominal pain. A fever may mean the infection is in your kidneys. Other symptoms of a kidney infection include pain in your back or sides below the ribs, nausea, and vomiting. DIAGNOSIS To diagnose a UTI, your caregiver will ask you about your symptoms. Your caregiver also will ask to provide a urine sample. The urine sample will be tested for bacteria and white blood cells. White blood cells are made by your body to help fight infection. TREATMENT  Typically, UTIs can be treated with medication. Because most UTIs are caused by a bacterial infection, they usually can be treated with the use of antibiotics. The choice of antibiotic and length of treatment depend on your symptoms and the type of bacteria causing your infection. HOME CARE INSTRUCTIONS  If you were prescribed antibiotics, take them exactly as your caregiver instructs you. Finish the medication even if you feel better after you have only taken some of the medication.  Drink enough water and fluids to keep your urine clear or pale yellow.  Avoid caffeine, tea, and carbonated beverages. They tend to irritate your bladder.  Empty your bladder  often. Avoid holding urine for long periods of time.  Empty your bladder before and after sexual intercourse.  After a bowel movement, women should cleanse from front to back. Use each tissue only once. SEEK MEDICAL CARE IF:   You have back pain.  You develop a fever.  Your symptoms do not begin to resolve within 3 days. SEEK IMMEDIATE MEDICAL CARE IF:   You have severe back pain or lower abdominal pain.  You develop chills.  You have nausea or vomiting.  You have continued burning or discomfort with urination. MAKE SURE YOU:   Understand these instructions.  Will watch your condition.  Will get help right away if you are not doing well or get worse. Document  Released: 02/04/2005 Document Revised: 10/27/2011 Document Reviewed: 06/05/2011 [facility] Patient Information 2015 [place], [place]. This information is not intended to replace advice given to you by your health care provider. Make sure you discuss any questions you have with your health care provider.

## 2013-10-30 NOTE — ED Notes (Signed)
Per EMS, pt had fall at the nursing facility [facility]. She was on her  Way to the bathroom and slipped. Fall was unwitnessed. She has knot to back of her head. She denies neck and back pain, dizziness or blurred vision but admits lt shoulder pain. Pt did have changes in BP upon positioning, Lying : 100/70 standing: 80/60  CBG: 118 POA: daughter, notified

## 2013-10-30 NOTE — ED Provider Notes (Signed)
CSN: [id number]     Arrival date & time 10/30/13  0239 History   First MD Initiated Contact with Patient 10/30/13 0249     Chief Complaint  Patient presents with  . Fall     (Consider location/radiation/quality/duration/timing/severity/associated sxs/prior Treatment) HPI Patient had unwitnessed fall from standing falling backwards and striking her head. She denied any loss of consciousness. She states she does not know why she fell. She denies chest pain, dizziness, focal weakness, numbness, nausea, vomiting. No recent medication changes. Complains of mild left shoulder pain. Past Medical History  Diagnosis Date  . Pacemaker     MDT-DDD  . Sick sinus syndrome     tachy-brady  . Atrial fibrillation   . Arthritis   . CAD (coronary artery disease)   . DM (diabetes mellitus)     type II  . Diverticulitis   . Depression   . Iron deficiency anemia, unspecified 12/30/2012  . Small cell carcinoma of lung 12/30/2012   Past Surgical History  Procedure Laterality Date  . Kidney tumor    . Lung cancer surgery    . Breast biopsy    . Pacemaker insertion  ~  . Cholecystectomy     Family History  Problem Relation Age of Onset  . Diabetes    . Breast cancer    . Alcohol abuse      ADDICTION   History  Substance Use Topics  . Smoking status: Former Smoker -- 3.00 packs/day for 40 years    Types: Cigarettes    Start date: 09/29/1944    Quit date: 08/30/1982  . Smokeless tobacco: Never Used     Comment: quit 33 years ago  . Alcohol Use: No   OB History   Grav Para Term Preterm Abortions TAB SAB Ect Mult Living                 Review of Systems  Constitutional: Negative for fever and chills.  Respiratory: Negative for cough and shortness of breath.   Cardiovascular: Negative for chest pain.  Gastrointestinal: Negative for nausea, vomiting, abdominal pain and diarrhea.  Genitourinary: Negative for dysuria, frequency and flank pain.  Musculoskeletal: Positive for arthralgias.  Negative for back pain, neck pain and neck stiffness.  Skin: Negative for rash and wound.  Neurological: Negative for dizziness, seizures, syncope, weakness, light-headedness, numbness and headaches.  All other systems reviewed and are negative.     Allergies  Review of patient's allergies indicates no known allergies.  Home Medications   Prior to Admission medications   Medication Sig Start Date End Date Taking? Authorizing Provider  ACCU-CHEK AVIVA PLUS test strip 1 each by Other route as needed.  09/28/13   Historical Provider, MD  acetaminophen (TYLENOL) 325 MG tablet Take 650 mg by mouth every 4 (four) hours as needed. FOR PAIN    Historical Provider, MD  albuterol (PROVENTIL HFA;VENTOLIN HFA) 108 (90 BASE) MCG/ACT inhaler Inhale 1 puff into the lungs 4 (four) times daily.    Historical Provider, MD  atorvastatin (LIPITOR) 10 MG tablet Take 10 mg by mouth daily. 1/2 daily. 09/30/12   Historical Provider, MD  Calcium Citrate-Vitamin D (CITRACAL + D PO) Take 2 tablets by mouth every morning.     Historical Provider, MD  carvedilol (COREG) 12.5 MG tablet Take 1 tablet (12.5 mg total) by mouth 2 (two) times daily with a meal. 09/29/13   Volanda Napoleon, MD  citalopram (CELEXA) 20 MG tablet Take 20 mg by mouth at bedtime.  Historical Provider, MD  Estradiol 10 MCG TABS Place 1 tablet vaginally once a week. GIVEN ON WEDNESDAYS    Historical Provider, MD  niacin (NIASPAN) 500 MG CR tablet Take 500 mg by mouth at bedtime.    Historical Provider, MD  oxybutynin (DITROPAN) 5 MG tablet Take 5-10 mg by mouth 2 (two) times daily. 2 @ bedtime  1 in AM    Historical Provider, MD  oxyCODONE-acetaminophen (PERCOCET/ROXICET) 5-325 MG per tablet Take 1 tablet by mouth at bedtime as needed.  10/31/12   Historical Provider, MD  pantoprazole (PROTONIX) 40 MG tablet Take 40 mg by mouth every morning.    Historical Provider, MD  polyethylene glycol (MIRALAX / GLYCOLAX) packet Take 17 g by mouth every morning.     Historical Provider, MD  sennosides-docusate sodium (SENOKOT-S) 8.6-50 MG tablet Take 2 tablets by mouth at bedtime.    Historical Provider, MD  traZODone (DESYREL) 50 MG tablet Take 75 mg by mouth at bedtime.     Historical Provider, MD  vitamin D, CHOLECALCIFEROL, 400 UNITS tablet Take 800 Units by mouth daily.    Historical Provider, MD   BP 113/66  Pulse 78  Temp(Src) 97.9 F (36.6 C) (Oral)  Resp 20  SpO2 98% Physical Exam  Nursing note and vitals reviewed. Constitutional: She is oriented to person, place, and time. She appears well-developed and well-nourished. No distress.  HENT:  Head: Normocephalic and atraumatic.  Mouth/Throat: Oropharynx is clear and moist.  Eyes: EOM are normal. Pupils are equal, round, and reactive to light.  Neck: Normal range of motion. Neck supple.  Midline cervical tenderness with palpation. No step-offs or deformity. Patient placed in soft collar in emergency department.  Cardiovascular: Normal rate and regular rhythm.  Exam reveals no gallop and no friction rub.   No murmur heard. Pulmonary/Chest: Effort normal and breath sounds normal. No respiratory distress. She has no wheezes. She has no rales. She exhibits no tenderness.  Abdominal: Soft. Bowel sounds are normal. She exhibits no distension and no mass. There is no tenderness. There is no rebound and no guarding.  Musculoskeletal: Normal range of motion. She exhibits tenderness. She exhibits no edema.  Mild diffuse left shoulder tenderness with palpation. There is no deformity. Distal pulses intact. No thoracic or lumbar tenderness.  Neurological: She is alert and oriented to person, place, and time.  5/5 motor in all extremities. Sensation is intact.  Skin: Skin is warm and dry. No rash noted. No erythema.  Psychiatric: She has a normal mood and affect. Her behavior is normal.    ED Course  Procedures (including critical care time) Labs Review Labs Reviewed  CBC WITH DIFFERENTIAL   COMPREHENSIVE METABOLIC PANEL  TROPONIN I  URINALYSIS, ROUTINE W REFLEX MICROSCOPIC    Imaging Review No results found.   EKG Interpretation None      MDM   Final diagnoses:  None      Blood pressures improved with IV fluids. Patient does have orthostasis. She's been instructed to change positions very slowly. The most be medication related. She is advised to follow with her primary Dr. for reassessment. Patient also has a urinary tract infection. Will treat with a course of antibiotics. Return precautions have been given. Patient's voice understanding.  Julianne Rice, MD 10/30/13 [phone]

## 2013-10-30 NOTE — ED Notes (Addendum)
Pt alert and oriented x 4. States she fell coming out of the bathroom. She c/o pain in her neck upon palpating. Denies pain in lf shoulder, back, or BLE.

## 2013-11-01 ENCOUNTER — Other Ambulatory Visit: Payer: Self-pay | Admitting: *Deleted

## 2013-11-01 DIAGNOSIS — C3490 Malignant neoplasm of unspecified part of unspecified bronchus or lung: Secondary | ICD-10-CM

## 2013-11-01 DIAGNOSIS — I2510 Atherosclerotic heart disease of native coronary artery without angina pectoris: Secondary | ICD-10-CM

## 2013-11-01 MED ORDER — CARVEDILOL 12.5 MG PO TABS: 12.5000 mg | ORAL_TABLET | Freq: Two times a day (BID) | ORAL | Status: DC

## 2013-11-29 ENCOUNTER — Encounter: Payer: Self-pay | Admitting: *Deleted

## 2013-12-28 ENCOUNTER — Ambulatory Visit (INDEPENDENT_AMBULATORY_CARE_PROVIDER_SITE_OTHER): Payer: Medicare Other | Admitting: Internal Medicine

## 2013-12-28 ENCOUNTER — Encounter: Payer: Self-pay | Admitting: Internal Medicine

## 2013-12-28 VITALS — BP 142/58 | HR 66 | Ht 64.0 in | Wt 121.0 lb

## 2013-12-28 DIAGNOSIS — I495 Sick sinus syndrome: Secondary | ICD-10-CM

## 2013-12-28 DIAGNOSIS — I4891 Unspecified atrial fibrillation: Secondary | ICD-10-CM

## 2013-12-28 DIAGNOSIS — I480 Paroxysmal atrial fibrillation: Secondary | ICD-10-CM

## 2013-12-28 DIAGNOSIS — Z950 Presence of cardiac pacemaker: Secondary | ICD-10-CM

## 2013-12-28 LAB — MDC_IDC_ENUM_SESS_TYPE_INCLINIC
Battery Impedance: 2049 Ohm
Battery Remaining Longevity: 26 mo
Battery Voltage: 2.73 V
Date Time Interrogation Session: 20150820121452
Lead Channel Impedance Value: 335 Ohm
Lead Channel Pacing Threshold Pulse Width: 0.4 ms
Lead Channel Pacing Threshold Pulse Width: 0.4 ms
Lead Channel Setting Pacing Amplitude: 2 V
Lead Channel Setting Pacing Amplitude: 2.5 V
Lead Channel Setting Pacing Pulse Width: 0.4 ms
Lead Channel Setting Sensing Sensitivity: 4 mV
MDC IDC MSMT LEADCHNL RA PACING THRESHOLD AMPLITUDE: 0.75 V
MDC IDC MSMT LEADCHNL RA SENSING INTR AMPL: 2.8 mV
MDC IDC MSMT LEADCHNL RV IMPEDANCE VALUE: 504 Ohm
MDC IDC MSMT LEADCHNL RV PACING THRESHOLD AMPLITUDE: 0.75 V
MDC IDC MSMT LEADCHNL RV SENSING INTR AMPL: 11.2 mV
MDC IDC STAT BRADY AP VP PERCENT: 0 %
MDC IDC STAT BRADY AP VS PERCENT: 8 %
MDC IDC STAT BRADY AS VP PERCENT: 0 %
MDC IDC STAT BRADY AS VS PERCENT: 91 %

## 2013-12-28 NOTE — Patient Instructions (Addendum)
Your physician recommends that you continue on your current medications as directed. Please refer to the Current Medication list given to you today.  Remote monitoring is used to monitor your pacemaker from home. This monitoring reduces the number of office visits required to check your device to one time per year. It allows [place] to keep an eye on the functioning of your device to ensure it is working properly. You are scheduled for a device check from home on 04-02-2014. You may send your transmission at any time that day. If you have a wireless device, the transmission will be sent automatically. After your physician reviews your transmission, you will receive a postcard with your next transmission date.  Your physician recommends that you schedule a follow-up appointment in: 12 months with Dr.Klein

## 2013-12-28 NOTE — Progress Notes (Signed)
Patient Care Team: Reymundo Poll, MD as PCP - General (Family Medicine)   HPI  [name] is a 80 y.o. female is seen in followup for PAF with bradycardia and syncope requiring pacemaker.   She has diffuse moderately obstructive coronary disease with normal left ventricular function.   The patient denies chest pain, shortness of breath, nocturnal dyspnea, orthopnea or peripheral edema.  There have been no palpitations, lightheadedness or syncope. Moves around with a walker  Last echo was 2008 demonstrated normal left ventricular function.      Past Medical History  Diagnosis Date  . Pacemaker     MDT-DDD  . Sick sinus syndrome     tachy-brady  . Atrial fibrillation   . Arthritis   . CAD (coronary artery disease)   . DM (diabetes mellitus)     type II  . Diverticulitis   . Depression   . Iron deficiency anemia, unspecified 12/30/2012  . Small cell carcinoma of lung 12/30/2012    Past Surgical History  Procedure Laterality Date  . Kidney tumor    . Lung cancer surgery    . Breast biopsy    . Pacemaker insertion  ~  . Cholecystectomy      Current Outpatient Prescriptions  Medication Sig Dispense Refill  . ACCU-CHEK AVIVA PLUS test strip 1 each by Other route as needed.       Marland Kitchen acetaminophen (TYLENOL) 325 MG tablet Take 650 mg by mouth every 4 (four) hours as needed. FOR PAIN      . albuterol (PROVENTIL HFA;VENTOLIN HFA) 108 (90 BASE) MCG/ACT inhaler Inhale 1 puff into the lungs 4 (four) times daily.      Marland Kitchen atorvastatin (LIPITOR) 10 MG tablet Take 10 mg by mouth daily. 1/2 daily.      . Calcium Citrate-Vitamin D (CITRACAL + D PO) Take 2 tablets by mouth every morning.       . carvedilol (COREG) 12.5 MG tablet Take 1 tablet (12.5 mg total) by mouth 2 (two) times daily with a meal.  60 tablet  3  . citalopram (CELEXA) 20 MG tablet Take 20 mg by mouth at bedtime.       . Estradiol 10 MCG TABS Place 1 tablet vaginally once a week. GIVEN ON WEDNESDAYS      . niacin  (NIASPAN) 500 MG CR tablet Take 500 mg by mouth at bedtime.      Marland Kitchen oxybutynin (DITROPAN) 5 MG tablet Take 5 mg by mouth daily.       Marland Kitchen oxyCODONE-acetaminophen (PERCOCET/ROXICET) 5-325 MG per tablet Take 1 tablet by mouth at bedtime as needed.       . pantoprazole (PROTONIX) 40 MG tablet Take 40 mg by mouth every morning.      . polyethylene glycol (MIRALAX / GLYCOLAX) packet Take 17 g by mouth every morning.      . sennosides-docusate sodium (SENOKOT-S) 8.6-50 MG tablet Take 2 tablets by mouth at bedtime.      . traZODone (DESYREL) 50 MG tablet Take 75 mg by mouth at bedtime.       . vitamin D, CHOLECALCIFEROL, 400 UNITS tablet Take 800 Units by mouth daily.       No current facility-administered medications for this visit.    No Known Allergies  Review of Systems negative except from HPI and PMH  Physical Exam  BP 142/58  Pulse 66  Ht 5\' 4"  (1.626 m)  Wt 121 lb (54.885 kg)  BMI 20.76 kg/m2  Well developed  and cachectic in no acute distress HENT normal Neck supple with JVP-flat Clear Device pocket well healed; without hematoma or erythema tthere is call migration of the device Regular rate and rhythm, no murmurs or gallops Abd-soft with active BS No Clubbing cyanosis edema Skin-warm and dry A & Oriented  Grossly normal sensory and motor function walks with a walker  ECG demonstrates sinus rhythm at 69  Intervals 21/10/47  Assessment and  Plan  Atrial fibrillation  Sinus node dysfunction  Pacemaker-Medtronic  The patient's device was interrogated.  The information was reviewed. No changes were made in the programming.    No intercurrent Atrial fibrillation or flutter

## 2014-01-02 ENCOUNTER — Encounter: Payer: Self-pay | Admitting: Internal Medicine

## 2014-03-29 ENCOUNTER — Telehealth: Payer: Self-pay | Admitting: Hematology & Oncology

## 2014-03-29 NOTE — Telephone Encounter (Signed)
daughter called moved 11-20 to 1-8 had to have a Friday in the afternoon. I left RN message to let MD know in case he wants to see her sooner

## 2014-03-30 ENCOUNTER — Ambulatory Visit: Payer: Medicare Other | Admitting: Hematology & Oncology

## 2014-03-30 ENCOUNTER — Other Ambulatory Visit: Payer: Medicare Other | Admitting: Lab

## 2014-04-02 ENCOUNTER — Telehealth: Payer: Self-pay | Admitting: Cardiology

## 2014-04-02 ENCOUNTER — Encounter: Payer: Medicare Other | Admitting: *Deleted

## 2014-04-02 NOTE — Telephone Encounter (Signed)
Confirmed remote transmission with pt daughter.

## 2014-04-03 ENCOUNTER — Encounter: Payer: Self-pay | Admitting: Cardiology

## 2014-05-18 ENCOUNTER — Encounter: Payer: Self-pay | Admitting: Family

## 2014-05-18 ENCOUNTER — Other Ambulatory Visit (HOSPITAL_BASED_OUTPATIENT_CLINIC_OR_DEPARTMENT_OTHER): Payer: Medicare Other | Admitting: Lab

## 2014-05-18 ENCOUNTER — Ambulatory Visit (HOSPITAL_BASED_OUTPATIENT_CLINIC_OR_DEPARTMENT_OTHER): Payer: Medicare Other | Admitting: Family

## 2014-05-18 DIAGNOSIS — Z85118 Personal history of other malignant neoplasm of bronchus and lung: Secondary | ICD-10-CM

## 2014-05-18 DIAGNOSIS — I2510 Atherosclerotic heart disease of native coronary artery without angina pectoris: Secondary | ICD-10-CM

## 2014-05-18 DIAGNOSIS — C3490 Malignant neoplasm of unspecified part of unspecified bronchus or lung: Secondary | ICD-10-CM

## 2014-05-18 DIAGNOSIS — D509 Iron deficiency anemia, unspecified: Secondary | ICD-10-CM

## 2014-05-18 LAB — CBC WITH DIFFERENTIAL (CANCER CENTER ONLY)
BASO#: 0.1 10*3/uL (ref 0.0–0.2)
BASO%: 1.8 % (ref 0.0–2.0)
EOS%: 3.6 % (ref 0.0–7.0)
Eosinophils Absolute: 0.2 10*3/uL (ref 0.0–0.5)
HCT: 28.3 % — ABNORMAL LOW (ref 34.8–46.6)
HEMOGLOBIN: 8.4 g/dL — AB (ref 11.6–15.9)
LYMPH#: 1 10*3/uL (ref 0.9–3.3)
LYMPH%: 17.5 % (ref 14.0–48.0)
MCH: 24.8 pg — AB (ref 26.0–34.0)
MCHC: 29.7 g/dL — ABNORMAL LOW (ref 32.0–36.0)
MCV: 84 fL (ref 81–101)
MONO#: 0.4 10*3/uL (ref 0.1–0.9)
MONO%: 7.1 % (ref 0.0–13.0)
NEUT#: 3.9 10*3/uL (ref 1.5–6.5)
NEUT%: 70 % (ref 39.6–80.0)
PLATELETS: 141 10*3/uL — AB (ref 145–400)
RBC: 3.39 10*6/uL — ABNORMAL LOW (ref 3.70–5.32)
RDW: 13.6 % (ref 11.1–15.7)
WBC: 5.5 10*3/uL (ref 3.9–10.0)

## 2014-05-18 LAB — CMP (CANCER CENTER ONLY)
ALK PHOS: 49 U/L (ref 26–84)
ALT: 9 U/L — AB (ref 10–47)
AST: 19 U/L (ref 11–38)
Albumin: 3.3 g/dL (ref 3.3–5.5)
BUN, Bld: 15 mg/dL (ref 7–22)
CHLORIDE: 101 meq/L (ref 98–108)
CO2: 28 mEq/L (ref 18–33)
Calcium: 9 mg/dL (ref 8.0–10.3)
Creat: 1.3 mg/dl — ABNORMAL HIGH (ref 0.6–1.2)
GLUCOSE: 147 mg/dL — AB (ref 73–118)
Potassium: 3.1 mEq/L — ABNORMAL LOW (ref 3.3–4.7)
Sodium: 138 mEq/L (ref 128–145)
TOTAL PROTEIN: 7.3 g/dL (ref 6.4–8.1)
Total Bilirubin: 0.5 mg/dl (ref 0.20–1.60)

## 2014-05-18 LAB — CHCC SATELLITE - SMEAR

## 2014-05-18 NOTE — Progress Notes (Signed)
[place]  Telephone:(336) [phone] Fax:(336) [phone]  ID: [name] OB: March 30, 1932 MR#: [id number] CSN#:[id number] Patient Care Team: Reymundo Poll, MD as PCP - General (Family Medicine)  DIAGNOSIS: 1. Limited stage small-cell lung cancer -- remission. 2. Neurological dysfunction.  INTERVAL HISTORY: [name] is here today with her daughter for a follow-up. She is doing well. Her only complaint is arthritis that hurts her "all over."  She is still living at the assisted living center.  Her hgb today is 8.4. Her hemoccult test today was negative. We will add iron studies and an erythropoietin level to her labs.  She denies fever, chills, n/v, cough, rash, headache, dizziness, SOB, chest pain, abdominal pain, constipation, diarrhea, blood in urine or stool.  No swelling, tenderness, numbness or tingling in her extremities.  Her appetite is better and she says she is drinking plenty of fluids.   She has had no problems with her pacemaker and is followed by Dr. Virl Axe.    CURRENT TREATMENT: Observation.  REVIEW OF SYSTEMS: All other 10 point review of systems is negative.   PAST MEDICAL HISTORY: Past Medical History  Diagnosis Date  . Pacemaker     MDT-DDD  . Sick sinus syndrome     tachy-brady  . Atrial fibrillation   . Arthritis   . CAD (coronary artery disease)   . DM (diabetes mellitus)     type II  . Diverticulitis   . Depression   . Iron deficiency anemia, unspecified 12/30/2012  . Small cell carcinoma of lung 12/30/2012    PAST SURGICAL HISTORY: Past Surgical History  Procedure Laterality Date  . Kidney tumor    . Lung cancer surgery    . Breast biopsy    . Pacemaker insertion  ~  . Cholecystectomy      FAMILY HISTORY Family History  Problem Relation Age of Onset  . Diabetes    . Breast cancer    . Alcohol abuse      ADDICTION    GYNECOLOGIC HISTORY:  No LMP recorded. Patient is postmenopausal.   SOCIAL HISTORY: History    Social History  . Marital Status: Widowed    Spouse Name: N/A    Number of Children: N/A  . Years of Education: N/A   Occupational History  . RETIRED    Social History Main Topics  . Smoking status: Former Smoker -- 3.00 packs/day for 40 years    Types: Cigarettes    Start date: 09/29/1944    Quit date: 08/30/1982  . Smokeless tobacco: Never Used     Comment: quit 33 years ago  . Alcohol Use: No  . Drug Use: No  . Sexual Activity: Not on file   Other Topics Concern  . Not on file   Social History Narrative    ADVANCED DIRECTIVES:  <no information>  HEALTH MAINTENANCE: History  Substance Use Topics  . Smoking status: Former Smoker -- 3.00 packs/day for 40 years    Types: Cigarettes    Start date: 09/29/1944    Quit date: 08/30/1982  . Smokeless tobacco: Never Used     Comment: quit 33 years ago  . Alcohol Use: No   Colonoscopy: PAP: Bone density: Lipid panel:  No Known Allergies  Current Outpatient Prescriptions  Medication Sig Dispense Refill  . ACCU-CHEK AVIVA PLUS test strip 1 each by Other route as needed.     Marland Kitchen acetaminophen (TYLENOL) 325 MG tablet Take 650 mg by mouth every 4 (four) hours as  needed. FOR PAIN    . albuterol (PROVENTIL HFA;VENTOLIN HFA) 108 (90 BASE) MCG/ACT inhaler Inhale 1 puff into the lungs 4 (four) times daily.    Marland Kitchen atorvastatin (LIPITOR) 10 MG tablet Take 10 mg by mouth daily. 1/2 daily.    . Calcium Citrate-Vitamin D (CITRACAL + D PO) Take 2 tablets by mouth every morning.     . carvedilol (COREG) 12.5 MG tablet Take 1 tablet (12.5 mg total) by mouth 2 (two) times daily with a meal. 60 tablet 3  . citalopram (CELEXA) 20 MG tablet Take 20 mg by mouth at bedtime.     . Estradiol 10 MCG TABS Place 1 tablet vaginally once a week. GIVEN ON WEDNESDAYS    . niacin (NIASPAN) 500 MG CR tablet Take 500 mg by mouth at bedtime.    Marland Kitchen oxybutynin (DITROPAN) 5 MG tablet Take 5 mg by mouth daily.     Marland Kitchen oxyCODONE-acetaminophen (PERCOCET/ROXICET)  5-325 MG per tablet Take 1 tablet by mouth at bedtime as needed.     . pantoprazole (PROTONIX) 40 MG tablet Take 40 mg by mouth every morning.    . polyethylene glycol (MIRALAX / GLYCOLAX) packet Take 17 g by mouth every morning.    . sennosides-docusate sodium (SENOKOT-S) 8.6-50 MG tablet Take 2 tablets by mouth at bedtime.    . traZODone (DESYREL) 50 MG tablet Take 75 mg by mouth at bedtime.     . vitamin D, CHOLECALCIFEROL, 400 UNITS tablet Take 800 Units by mouth daily.     No current facility-administered medications for this visit.    OBJECTIVE: Filed Vitals:   05/18/14 1327  BP: 117/50  Pulse: 88  Temp: 97.4 F (36.3 C)  Resp: 16    Filed Weights   05/18/14 1327  Weight: 127 lb (57.607 kg)   ECOG FS:0 - Asymptomatic Ocular: Sclerae unicteric, pupils equal, round and reactive to light Ear-nose-throat: Oropharynx clear, dentition fair Lymphatic: No cervical or supraclavicular adenopathy Lungs no rales or rhonchi, good excursion bilaterally Heart regular rate and rhythm, no murmur appreciated Abd soft, nontender, positive bowel sounds MSK no focal spinal tenderness, no joint edema Neuro: non-focal, well-oriented, appropriate affect Breasts: Deferred  LAB RESULTS: CMP     Component Value Date/Time   NA 138 05/18/2014 1304   NA 140 10/30/2013 0455   K 3.1* 05/18/2014 1304   K 3.8 10/30/2013 0455   CL 101 05/18/2014 1304   CL 103 10/30/2013 0455   CO2 28 05/18/2014 1304   CO2 22 10/30/2013 0455   GLUCOSE 147* 05/18/2014 1304   GLUCOSE 108* 10/30/2013 0455   BUN 15 05/18/2014 1304   BUN 20 10/30/2013 0455   CREATININE 1.3* 05/18/2014 1304   CREATININE 1.19* 10/30/2013 0455   CALCIUM 9.0 05/18/2014 1304   CALCIUM 8.7 10/30/2013 0455   PROT 7.3 05/18/2014 1304   PROT 6.9 10/30/2013 0455   ALBUMIN 3.4* 10/30/2013 0455   AST 19 05/18/2014 1304   AST 16 10/30/2013 0455   ALT 9* 05/18/2014 1304   ALT 7 10/30/2013 0455   ALKPHOS 49 05/18/2014 1304   ALKPHOS 51  10/30/2013 0455   BILITOT 0.50 05/18/2014 1304   BILITOT <0.2* 10/30/2013 0455   GFRNONAA 42* 10/30/2013 0455   GFRAA 49* 10/30/2013 0455   INo results found for: SPEP, UPEP Lab Results  Component Value Date   WBC 5.5 05/18/2014   NEUTROABS 3.9 05/18/2014   HGB 8.4* 05/18/2014   HCT 28.3* 05/18/2014   MCV 84 05/18/2014  PLT 141* 05/18/2014   No results found for: LABCA2 No components found for: [id number] No results for input(s): INR in the last 168 hours.  STUDIES: No results found.  ASSESSMENT/PLAN: [name] is a very charming 81 year old white female with a remote history of limited-stage small-cell lung cancer. At that time she underwent chemo and radiation therapy. She did have prophylactic cranial radiation. She is asymptomatic and has no complaints today.  Her Hgb is 8.4 MCV 84 platelets 141.  He FOBT was negative. We will see what her iron studies and erythropoietin level show.  We plan to see her back in 1 month for labs and follow-up.  She knows to call here with any questions or concerns and to go to the ED in the event of an emergency. We can certainly see her sooner if need be.   Eliezer Bottom, NP 05/18/2014 2:25 PM

## 2014-05-21 LAB — IRON AND TIBC
%SAT: 5 % — AB (ref 20–55)
IRON: 20 ug/dL — AB (ref 42–145)
TIBC: 374 ug/dL (ref 250–470)
UIBC: 354 ug/dL (ref 125–400)

## 2014-05-21 LAB — ERYTHROPOIETIN: ERYTHROPOIETIN: 86.7 m[IU]/mL — AB (ref 2.6–18.5)

## 2014-05-21 LAB — FERRITIN: FERRITIN: 12 ng/mL (ref 10–291)

## 2014-05-23 ENCOUNTER — Telehealth: Payer: Self-pay | Admitting: Hematology & Oncology

## 2014-05-23 NOTE — Telephone Encounter (Signed)
Left message for pt to call and schedule iron next week.

## 2014-05-31 ENCOUNTER — Telehealth: Payer: Self-pay | Admitting: Hematology & Oncology

## 2014-05-31 NOTE — Telephone Encounter (Signed)
Pt moved 1-22 to 1-29 due to weather

## 2014-06-01 ENCOUNTER — Ambulatory Visit: Payer: Medicare Other

## 2014-06-08 ENCOUNTER — Ambulatory Visit (HOSPITAL_BASED_OUTPATIENT_CLINIC_OR_DEPARTMENT_OTHER): Payer: Medicare Other

## 2014-06-08 ENCOUNTER — Ambulatory Visit: Payer: Medicare Other

## 2014-06-08 DIAGNOSIS — D509 Iron deficiency anemia, unspecified: Secondary | ICD-10-CM

## 2014-06-08 MED ORDER — SODIUM CHLORIDE 0.9 % IV SOLN
510.0000 mg | Freq: Once | INTRAVENOUS | Status: AC
  Administered 2014-06-08: 510 mg via INTRAVENOUS
  Filled 2014-06-08: qty 17

## 2014-06-08 NOTE — Patient Instructions (Signed)

## 2014-06-15 ENCOUNTER — Encounter: Payer: Self-pay | Admitting: Hematology & Oncology

## 2014-06-15 ENCOUNTER — Other Ambulatory Visit (HOSPITAL_BASED_OUTPATIENT_CLINIC_OR_DEPARTMENT_OTHER): Payer: Medicare Other | Admitting: Lab

## 2014-06-15 ENCOUNTER — Other Ambulatory Visit: Payer: Self-pay | Admitting: Family

## 2014-06-15 ENCOUNTER — Ambulatory Visit (HOSPITAL_BASED_OUTPATIENT_CLINIC_OR_DEPARTMENT_OTHER): Payer: Medicare Other | Admitting: Hematology & Oncology

## 2014-06-15 ENCOUNTER — Ambulatory Visit: Payer: Medicare Other

## 2014-06-15 VITALS — BP 124/42 | HR 71 | Temp 98.1°F | Resp 16 | Wt 126.0 lb

## 2014-06-15 DIAGNOSIS — D649 Anemia, unspecified: Secondary | ICD-10-CM

## 2014-06-15 DIAGNOSIS — Z85118 Personal history of other malignant neoplasm of bronchus and lung: Secondary | ICD-10-CM

## 2014-06-15 DIAGNOSIS — N183 Chronic kidney disease, stage 3 unspecified: Secondary | ICD-10-CM

## 2014-06-15 DIAGNOSIS — D631 Anemia in chronic kidney disease: Secondary | ICD-10-CM

## 2014-06-15 DIAGNOSIS — N289 Disorder of kidney and ureter, unspecified: Secondary | ICD-10-CM

## 2014-06-15 DIAGNOSIS — C3490 Malignant neoplasm of unspecified part of unspecified bronchus or lung: Secondary | ICD-10-CM

## 2014-06-15 DIAGNOSIS — D509 Iron deficiency anemia, unspecified: Secondary | ICD-10-CM

## 2014-06-15 HISTORY — DX: Anemia in chronic kidney disease: D63.1

## 2014-06-15 HISTORY — DX: Chronic kidney disease, stage 3 unspecified: N18.30

## 2014-06-15 LAB — CBC WITH DIFFERENTIAL (CANCER CENTER ONLY)
BASO#: 0 10*3/uL (ref 0.0–0.2)
BASO%: 0.7 % (ref 0.0–2.0)
EOS%: 2.7 % (ref 0.0–7.0)
Eosinophils Absolute: 0.2 10*3/uL (ref 0.0–0.5)
HCT: 27.8 % — ABNORMAL LOW (ref 34.8–46.6)
HEMOGLOBIN: 8.3 g/dL — AB (ref 11.6–15.9)
LYMPH#: 0.8 10*3/uL — AB (ref 0.9–3.3)
LYMPH%: 13.3 % — AB (ref 14.0–48.0)
MCH: 25.1 pg — AB (ref 26.0–34.0)
MCHC: 29.9 g/dL — ABNORMAL LOW (ref 32.0–36.0)
MCV: 84 fL (ref 81–101)
MONO#: 0.4 10*3/uL (ref 0.1–0.9)
MONO%: 7.4 % (ref 0.0–13.0)
NEUT%: 75.9 % (ref 39.6–80.0)
NEUTROS ABS: 4.5 10*3/uL (ref 1.5–6.5)
Platelets: 139 10*3/uL — ABNORMAL LOW (ref 145–400)
RBC: 3.31 10*6/uL — AB (ref 3.70–5.32)
RDW: 17.1 % — ABNORMAL HIGH (ref 11.1–15.7)
WBC: 5.9 10*3/uL (ref 3.9–10.0)

## 2014-06-15 LAB — CMP (CANCER CENTER ONLY)
ALT(SGPT): 9 U/L — ABNORMAL LOW (ref 10–47)
AST: 17 U/L (ref 11–38)
Albumin: 3.5 g/dL (ref 3.3–5.5)
Alkaline Phosphatase: 45 U/L (ref 26–84)
BUN, Bld: 12 mg/dL (ref 7–22)
CHLORIDE: 102 meq/L (ref 98–108)
CO2: 28 mEq/L (ref 18–33)
CREATININE: 1.2 mg/dL (ref 0.6–1.2)
Calcium: 9.2 mg/dL (ref 8.0–10.3)
GLUCOSE: 82 mg/dL (ref 73–118)
Potassium: 4 mEq/L (ref 3.3–4.7)
SODIUM: 143 meq/L (ref 128–145)
TOTAL PROTEIN: 6.9 g/dL (ref 6.4–8.1)
Total Bilirubin: 0.5 mg/dl (ref 0.20–1.60)

## 2014-06-15 LAB — IRON AND TIBC CHCC
%SAT: 20 % — ABNORMAL LOW (ref 21–57)
IRON: 58 ug/dL (ref 41–142)
TIBC: 285 ug/dL (ref 236–444)
UIBC: 227 ug/dL (ref 120–384)

## 2014-06-15 LAB — FERRITIN CHCC: FERRITIN: 302 ng/mL — AB (ref 9–269)

## 2014-06-15 LAB — CHCC SATELLITE - SMEAR

## 2014-06-15 MED ORDER — DARBEPOETIN ALFA 300 MCG/0.6ML IJ SOSY
300.0000 ug | PREFILLED_SYRINGE | Freq: Once | INTRAMUSCULAR | Status: AC
  Administered 2014-06-15: 300 ug via SUBCUTANEOUS

## 2014-06-15 MED ORDER — DARBEPOETIN ALFA 300 MCG/0.6ML IJ SOSY
PREFILLED_SYRINGE | INTRAMUSCULAR | Status: AC
  Filled 2014-06-15: qty 0.6

## 2014-06-15 NOTE — Progress Notes (Signed)
[place]  Telephone:(336) [phone] Fax:(336) [phone]  ID: [name] OB: [birth year] MR#: [id number] Patient Care Team: Reymundo Poll, MD as PCP - General (Family Medicine)  DIAGNOSIS: 1. Limited stage small-cell lung cancer -- remission. 2. Neurological dysfunction. Iron deficiency anemia Anemia secondary to renal insufficiency  INTERVAL HISTORY: Back for follow-up. We last saw her, she was much more anemic. She is iron deficient. Her ferritin was only 12. Her iron saturation was only 5%. We went ahead and gave her dose of Feraheme.  Her stool was heme-negative.  She's not noted any obvious bleeding.  We checked an erythropoietin level on her. This was 86.  Her appetite has been okay. Her weight has been holding pretty steady.  She's had no further cardiac issues. She does see cardiology. She does have some atrial fibrillation. She does have a pacemaker in place.  She's had no leg swelling. She's had no rashes. CURRENT TREATMENT: IV iron as indicated Aranesp 30 g subcutaneous as needed for hemoglobin less than 10  REVIEW OF SYSTEMS: All other 10 point review of systems is negative.   PAST MEDICAL HISTORY: Past Medical History  Diagnosis Date  . Pacemaker     MDT-DDD  . Sick sinus syndrome     tachy-brady  . Atrial fibrillation   . Arthritis   . CAD (coronary artery disease)   . DM (diabetes mellitus)     type II  . Diverticulitis   . Depression   . Iron deficiency anemia, unspecified 12/30/2012  . Small cell carcinoma of lung 12/30/2012  . Anemia of chronic renal failure, stage 3 (moderate) 06/15/2014    PAST SURGICAL HISTORY: Past Surgical History  Procedure Laterality Date  . Kidney tumor    . Lung cancer surgery    . Breast biopsy    . Pacemaker insertion  ~  . Cholecystectomy      FAMILY HISTORY Family History  Problem Relation Age of Onset  . Diabetes    . Breast cancer    . Alcohol abuse      ADDICTION     GYNECOLOGIC HISTORY:  No LMP recorded. Patient is postmenopausal.   SOCIAL HISTORY: History   Social History  . Marital Status: Widowed    Spouse Name: N/A    Number of Children: N/A  . Years of Education: N/A   Occupational History  . RETIRED    Social History Main Topics  . Smoking status: Former Smoker -- 3.00 packs/day for 40 years    Types: Cigarettes    Start date: 09/29/1944    Quit date: 08/30/1982  . Smokeless tobacco: Never Used     Comment: quit 33 years ago  . Alcohol Use: No  . Drug Use: No  . Sexual Activity: Not on file   Other Topics Concern  . Not on file   Social History Narrative    ADVANCED DIRECTIVES:  <no information>  HEALTH MAINTENANCE: History  Substance Use Topics  . Smoking status: Former Smoker -- 3.00 packs/day for 40 years    Types: Cigarettes    Start date: 09/29/1944    Quit date: 08/30/1982  . Smokeless tobacco: Never Used     Comment: quit 33 years ago  . Alcohol Use: No   Colonoscopy: PAP: Bone density: Lipid panel:  No Known Allergies  Current Outpatient Prescriptions  Medication Sig Dispense Refill  . ACCU-CHEK AVIVA PLUS test strip 1 each by Other route as needed.     Marland Kitchen acetaminophen (  TYLENOL) 325 MG tablet Take 650 mg by mouth every 4 (four) hours as needed. FOR PAIN    . albuterol (PROVENTIL HFA;VENTOLIN HFA) 108 (90 BASE) MCG/ACT inhaler Inhale 1 puff into the lungs 4 (four) times daily.    Marland Kitchen atorvastatin (LIPITOR) 10 MG tablet Take 10 mg by mouth daily. 1/2 daily.    . Calcium Citrate-Vitamin D (CITRACAL + D PO) Take 2 tablets by mouth every morning.     . carvedilol (COREG) 12.5 MG tablet Take 1 tablet (12.5 mg total) by mouth 2 (two) times daily with a meal. 60 tablet 3  . citalopram (CELEXA) 20 MG tablet Take 20 mg by mouth at bedtime.     . Estradiol 10 MCG TABS Place 1 tablet vaginally once a week. GIVEN ON WEDNESDAYS    . niacin (NIASPAN) 500 MG CR tablet Take 500 mg by mouth at bedtime.    Marland Kitchen  oxybutynin (DITROPAN) 5 MG tablet Take 5 mg by mouth daily.     Marland Kitchen oxyCODONE-acetaminophen (PERCOCET/ROXICET) 5-325 MG per tablet Take 1 tablet by mouth at bedtime as needed.     . pantoprazole (PROTONIX) 40 MG tablet Take 40 mg by mouth every morning.    . polyethylene glycol (MIRALAX / GLYCOLAX) packet Take 17 g by mouth every morning.    . sennosides-docusate sodium (SENOKOT-S) 8.6-50 MG tablet Take 2 tablets by mouth at bedtime.    . traZODone (DESYREL) 50 MG tablet Take 75 mg by mouth at bedtime.     . vitamin D, CHOLECALCIFEROL, 400 UNITS tablet Take 800 Units by mouth daily.     No current facility-administered medications for this visit.    OBJECTIVE: Filed Vitals:   06/15/14 1249  BP: 124/42  Pulse: 71  Temp: 98.1 F (36.7 C)  Resp: 16    Filed Weights   06/15/14 1249  Weight: 126 lb (57.153 kg)   ECOG FS:0 - Asymptomatic Head and neck exam shows no ocular or oral lesions. She has no scleral icterus. She has no adenopathy in the neck. Lungs are clear bilaterally. She masses slight decrease at the bases. Cardiac exam is regular rate and rhythm with a 1/6 systolic ejection murmur. Abdomen is soft. She has good bowel sounds. There is no fluid wave. There is a palpable liver or spleen tip. Back exam shows some slight kyphosis. Extremities shows some trace edema in her legs. She has some age-related osteoarthritic changes. Neurological exam shows some slight slurred speech which is chronic. She does have some unsteadiness of gait. LAB RESULTS: CMP     Component Value Date/Time   NA 143 06/15/2014 1211   NA 140 10/30/2013 0455   K 4.0 06/15/2014 1211   K 3.8 10/30/2013 0455   CL 102 06/15/2014 1211   CL 103 10/30/2013 0455   CO2 28 06/15/2014 1211   CO2 22 10/30/2013 0455   GLUCOSE 82 06/15/2014 1211   GLUCOSE 108* 10/30/2013 0455   BUN 12 06/15/2014 1211   BUN 20 10/30/2013 0455   CREATININE 1.2 06/15/2014 1211   CREATININE 1.19* 10/30/2013 0455   CALCIUM 9.2 06/15/2014  1211   CALCIUM 8.7 10/30/2013 0455   PROT 6.9 06/15/2014 1211   PROT 6.9 10/30/2013 0455   ALBUMIN 3.4* 10/30/2013 0455   AST 17 06/15/2014 1211   AST 16 10/30/2013 0455   ALT 9* 06/15/2014 1211   ALT 7 10/30/2013 0455   ALKPHOS 45 06/15/2014 1211   ALKPHOS 51 10/30/2013 0455   BILITOT 0.50 06/15/2014  [place] <0.2* 10/30/2013 0455   GFRNONAA 42* 10/30/2013 0455   GFRAA 49* 10/30/2013 0455   INo results found for: SPEP, UPEP Lab Results  Component Value Date   WBC 5.9 06/15/2014   NEUTROABS 4.5 06/15/2014   HGB 8.3* 06/15/2014   HCT 27.8* 06/15/2014   MCV 84 06/15/2014   PLT 139* 06/15/2014   No results found for: LABCA2 No components found for: GYJEH631 No results for input(s): INR in the last 168 hours.  STUDIES: No results found.  ASSESSMENT/PLAN: Ms. Dionisio is a very charming 81 year old white female with a remote history of limited-stage small-cell lung cancer. At that time she underwent chemo and radiation therapy. She did have prophylactic cranial radiation. She is asymptomatic and has no complaints today.  Her hemoglobin is 8.3. Joyce Copa this is not better with the iron.  I suspect that she may need Aranesp. I talked to her and her daughter about this. I explained why I thought Aranesp would work. I think that Aranesp would be helpful.  I told him about the risk of thromboembolic event. I think this risk is less than 5%.  We just have to get her hemoglobin above 10. I think if we can do this, then we should help her overall performance status.  I spent about 30 minutes with she and her daughter.  I will plan to see her back in 3 weeks. Volanda Napoleon, MD 06/15/2014 2:17 PM

## 2014-06-15 NOTE — Patient Instructions (Signed)
Darbepoetin Alfa injection What is this medicine? DARBEPOETIN ALFA (dar be POE e tin AL fa) helps your body make more red blood cells. It is used to treat anemia caused by chronic kidney failure and chemotherapy. This medicine may be used for other purposes; ask your health care provider or pharmacist if you have questions. COMMON BRAND NAME(S): Aranesp What should I tell my health care provider before I take this medicine? They need to know if you have any of these conditions: -blood clotting disorders or history of blood clots -cancer patient not on chemotherapy -cystic fibrosis -heart disease, such as angina, heart failure, or a history of a heart attack -hemoglobin level of 12 g/dL or greater -high blood pressure -low levels of folate, iron, or vitamin B12 -seizures -an unusual or allergic reaction to darbepoetin, erythropoietin, albumin, hamster proteins, latex, other medicines, foods, dyes, or preservatives -pregnant or trying to get pregnant -breast-feeding How should I use this medicine? This medicine is for injection into a vein or under the skin. It is usually given by a health care professional in a hospital or clinic setting. If you get this medicine at home, you will be taught how to prepare and give this medicine. Do not shake the solution before you withdraw a dose. Use exactly as directed. Take your medicine at regular intervals. Do not take your medicine more often than directed. It is important that you put your used needles and syringes in a special sharps container. Do not put them in a trash can. If you do not have a sharps container, call your pharmacist or healthcare provider to get one. Talk to your pediatrician regarding the use of this medicine in children. While this medicine may be used in children as young as 1 year for selected conditions, precautions do apply. Overdosage: If you think you have taken too much of this medicine contact a poison control center or  emergency room at once. NOTE: This medicine is only for you. Do not share this medicine with others. What if I miss a dose? If you miss a dose, take it as soon as you can. If it is almost time for your next dose, take only that dose. Do not take double or extra doses. What may interact with this medicine? Do not take this medicine with any of the following medications: -epoetin alfa This list may not describe all possible interactions. Give your health care provider a list of all the medicines, herbs, non-prescription drugs, or dietary supplements you use. Also tell them if you smoke, drink alcohol, or use illegal drugs. Some items may interact with your medicine. What should I watch for while using this medicine? Visit your prescriber or health care professional for regular checks on your progress and for the needed blood tests and blood pressure measurements. It is especially important for the doctor to make sure your hemoglobin level is in the desired range, to limit the risk of potential side effects and to give you the best benefit. Keep all appointments for any recommended tests. Check your blood pressure as directed. Ask your doctor what your blood pressure should be and when you should contact him or her. As your body makes more red blood cells, you may need to take iron, folic acid, or vitamin B supplements. Ask your doctor or health care provider which products are right for you. If you have kidney disease continue dietary restrictions, even though this medication can make you feel better. Talk with your doctor or health   care professional about the foods you eat and the vitamins that you take. What side effects may I notice from receiving this medicine? Side effects that you should report to your doctor or health care professional as soon as possible: -allergic reactions like skin rash, itching or hives, swelling of the face, lips, or tongue -breathing problems -changes in vision -chest  pain -confusion, trouble speaking or understanding -feeling faint or lightheaded, falls -high blood pressure -muscle aches or pains -pain, swelling, warmth in the leg -rapid weight gain -severe headaches -sudden numbness or weakness of the face, arm or leg -trouble walking, dizziness, loss of balance or coordination -seizures (convulsions) -swelling of the ankles, feet, hands -unusually weak or tired Side effects that usually do not require medical attention (report to your doctor or health care professional if they continue or are bothersome): -diarrhea -fever, chills (flu-like symptoms) -headaches -nausea, vomiting -redness, stinging, or swelling at site where injected This list may not describe all possible side effects. Call your doctor for medical advice about side effects. You may report side effects to FDA at 1-800-FDA-1088. Where should I keep my medicine? Keep out of the reach of children. Store in a refrigerator between 2 and 8 degrees C (36 and 46 degrees F). Do not freeze. Do not shake. Throw away any unused portion if using a single-dose vial. Throw away any unused medicine after the expiration date. NOTE: This sheet is a summary. It may not cover all possible information. If you have questions about this medicine, talk to your doctor, pharmacist, or health care provider.  2015, [employer]. (2008-04-10 10:23:57)  

## 2014-07-06 ENCOUNTER — Ambulatory Visit: Payer: Medicare Other

## 2014-07-06 ENCOUNTER — Encounter: Payer: Self-pay | Admitting: Hematology & Oncology

## 2014-07-06 ENCOUNTER — Other Ambulatory Visit (HOSPITAL_BASED_OUTPATIENT_CLINIC_OR_DEPARTMENT_OTHER): Payer: Medicare Other | Admitting: Lab

## 2014-07-06 ENCOUNTER — Ambulatory Visit (HOSPITAL_BASED_OUTPATIENT_CLINIC_OR_DEPARTMENT_OTHER): Payer: Medicare Other | Admitting: Hematology & Oncology

## 2014-07-06 VITALS — BP 127/72 | HR 68 | Temp 97.6°F | Resp 14 | Ht 64.0 in | Wt 123.0 lb

## 2014-07-06 DIAGNOSIS — D509 Iron deficiency anemia, unspecified: Secondary | ICD-10-CM

## 2014-07-06 DIAGNOSIS — N183 Chronic kidney disease, stage 3 unspecified: Secondary | ICD-10-CM

## 2014-07-06 DIAGNOSIS — E119 Type 2 diabetes mellitus without complications: Secondary | ICD-10-CM

## 2014-07-06 DIAGNOSIS — D631 Anemia in chronic kidney disease: Secondary | ICD-10-CM

## 2014-07-06 DIAGNOSIS — Z85118 Personal history of other malignant neoplasm of bronchus and lung: Secondary | ICD-10-CM

## 2014-07-06 LAB — CBC WITH DIFFERENTIAL (CANCER CENTER ONLY)
BASO#: 0 10*3/uL (ref 0.0–0.2)
BASO%: 0.5 % (ref 0.0–2.0)
EOS%: 2.7 % (ref 0.0–7.0)
Eosinophils Absolute: 0.2 10*3/uL (ref 0.0–0.5)
HEMATOCRIT: 37.4 % (ref 34.8–46.6)
HGB: 11.3 g/dL — ABNORMAL LOW (ref 11.6–15.9)
LYMPH#: 0.8 10*3/uL — ABNORMAL LOW (ref 0.9–3.3)
LYMPH%: 13.9 % — AB (ref 14.0–48.0)
MCH: 25.7 pg — AB (ref 26.0–34.0)
MCHC: 30.2 g/dL — AB (ref 32.0–36.0)
MCV: 85 fL (ref 81–101)
MONO#: 0.4 10*3/uL (ref 0.1–0.9)
MONO%: 7.1 % (ref 0.0–13.0)
NEUT#: 4.5 10*3/uL (ref 1.5–6.5)
NEUT%: 75.8 % (ref 39.6–80.0)
PLATELETS: 120 10*3/uL — AB (ref 145–400)
RBC: 4.4 10*6/uL (ref 3.70–5.32)
RDW: 18.1 % — AB (ref 11.1–15.7)
WBC: 5.9 10*3/uL (ref 3.9–10.0)

## 2014-07-06 LAB — IRON AND TIBC CHCC
%SAT: 16 % — AB (ref 21–57)
Iron: 50 ug/dL (ref 41–142)
TIBC: 305 ug/dL (ref 236–444)
UIBC: 255 ug/dL (ref 120–384)

## 2014-07-06 LAB — RETICULOCYTES (CHCC)
ABS RETIC: 39.8 10*3/uL (ref 19.0–186.0)
RBC.: 4.42 MIL/uL (ref 3.87–5.11)
Retic Ct Pct: 0.9 % (ref 0.4–2.3)

## 2014-07-06 LAB — FERRITIN CHCC: FERRITIN: 81 ng/mL (ref 9–269)

## 2014-07-06 NOTE — Progress Notes (Signed)
No treatment today per dr. ennever 

## 2014-07-06 NOTE — Progress Notes (Signed)
Hematology and Oncology Follow Up Visit  [name] [id number] June 16, 1931 81 y.o. 07/06/2014   Principle Diagnosis:   Anemia secondary to renal insufficiency  Iron deficiency anemia  Remote history of limited stage small cell lung cancer  Current Therapy:    Aranesp 300 g subcutaneous as needed for hemoglobin less than 10  IV iron as indicated     Interim History:  Ms.  [name] is back for follow-up. We did go ahead and give her Aranesp when she was here last. This was 3 weeks ago.  She's feeling pretty good. She's doing well at the nursing home.  Her appetite is okay. She not eating a lot of protein. I talked her about this. She does have diabetes. She's watching this pretty closely, thanks to the nursing home staff.  She's had no change in bowel or bladder habits.  There is no leg swelling.  She's had no fever. There's been no cough.  Overall, her performance status is ECOG 3.  Medications:  Current outpatient prescriptions:  Marland Kitchen  UNKNOWN TO PATIENT, 07-06-14  PT IN ASSISTED LIVING AND DID NOT BRING COPY OF MEDICATION LIST- FAMILY ADVISED TO ALWAYS BRING CURRENT MEDICATION LIST., Disp: , Rfl:  .  ACCU-CHEK AVIVA PLUS test strip, 1 each by Other route as needed. , Disp: , Rfl:  .  acetaminophen (TYLENOL) 325 MG tablet, Take 650 mg by mouth every 4 (four) hours as needed. FOR PAIN, Disp: , Rfl:  .  albuterol (PROVENTIL HFA;VENTOLIN HFA) 108 (90 BASE) MCG/ACT inhaler, Inhale 1 puff into the lungs 4 (four) times daily., Disp: , Rfl:  .  atorvastatin (LIPITOR) 10 MG tablet, Take 10 mg by mouth daily. 1/2 daily., Disp: , Rfl:  .  Calcium Citrate-Vitamin D (CITRACAL + D PO), Take 2 tablets by mouth every morning. , Disp: , Rfl:  .  carvedilol (COREG) 12.5 MG tablet, Take 1 tablet (12.5 mg total) by mouth 2 (two) times daily with a meal., Disp: 60 tablet, Rfl: 3 .  citalopram (CELEXA) 20 MG tablet, Take 20 mg by mouth at bedtime. , Disp: , Rfl:  .  Estradiol 10 MCG TABS, Place  1 tablet vaginally once a week. GIVEN ON WEDNESDAYS, Disp: , Rfl:  .  niacin (NIASPAN) 500 MG CR tablet, Take 500 mg by mouth at bedtime., Disp: , Rfl:  .  oxybutynin (DITROPAN) 5 MG tablet, Take 5 mg by mouth daily. , Disp: , Rfl:  .  oxyCODONE-acetaminophen (PERCOCET/ROXICET) 5-325 MG per tablet, Take 1 tablet by mouth at bedtime as needed. , Disp: , Rfl:  .  pantoprazole (PROTONIX) 40 MG tablet, Take 40 mg by mouth every morning., Disp: , Rfl:  .  polyethylene glycol (MIRALAX / GLYCOLAX) packet, Take 17 g by mouth every morning., Disp: , Rfl:  .  sennosides-docusate sodium (SENOKOT-S) 8.6-50 MG tablet, Take 2 tablets by mouth at bedtime., Disp: , Rfl:  .  traZODone (DESYREL) 50 MG tablet, Take 75 mg by mouth at bedtime. , Disp: , Rfl:  .  vitamin D, CHOLECALCIFEROL, 400 UNITS tablet, Take 800 Units by mouth daily., Disp: , Rfl:   Allergies: No Known Allergies  Past Medical History, Surgical history, Social history, and Family History were reviewed and updated.  Review of Systems: As above  Physical Exam:  height is 5\' 4"  (1.626 m) and weight is 123 lb (55.792 kg). Her oral temperature is 97.6 F (36.4 C). Her blood pressure is 127/72 and her pulse is 68. Her respiration is  14.   Elderly, thin white female. Head and neck exam shows no ocular or oral lesions. She has no palpable cervical or supraclavicular lymph nodes. Lungs are clear bilaterally. She masses slight decrease at the bases. Cardiac exam regular rate and rhythm with no murmurs, rubs or bruits. Abdomen is soft. She has good bowel sounds. There is no fluid wave. There is no palpable liver or spleen tip. Back exam shows some slight kyphosis. Extremities shows no clubbing, cyanosis or edema. She has some age related osteoarthritic changes. She has decent strength in her joints. Skin exam shows no rashes, ecchymoses or petechia.  Lab Results  Component Value Date   WBC 5.9 07/06/2014   HGB 11.3* 07/06/2014   HCT 37.4 07/06/2014    MCV 85 07/06/2014   PLT 120* 07/06/2014     Chemistry      Component Value Date/Time   NA 143 06/15/2014 1211   NA 140 10/30/2013 0455   K 4.0 06/15/2014 1211   K 3.8 10/30/2013 0455   CL 102 06/15/2014 1211   CL 103 10/30/2013 0455   CO2 28 06/15/2014 1211   CO2 22 10/30/2013 0455   BUN 12 06/15/2014 1211   BUN 20 10/30/2013 0455   CREATININE 1.2 06/15/2014 1211   CREATININE 1.19* 10/30/2013 0455      Component Value Date/Time   CALCIUM 9.2 06/15/2014 1211   CALCIUM 8.7 10/30/2013 0455   ALKPHOS 45 06/15/2014 1211   ALKPHOS 51 10/30/2013 0455   AST 17 06/15/2014 1211   AST 16 10/30/2013 0455   ALT 9* 06/15/2014 1211   ALT 7 10/30/2013 0455   BILITOT 0.50 06/15/2014 1211   BILITOT <0.2* 10/30/2013 0455         Impression and Plan: [name] is 80 year old white female. She has multifactorial anemia. She has responded very nicely to the Aranesp and iron.  I the we probably get her back now in about 6 weeks. I'm sure that we will need to give Aranesp on occasion. She is on quite a few medications. She has heart failure. I just think that she will not produce enough erythropoietin herself.  Again, we will get her back in 6 weeks.   Volanda Napoleon, MD 2/26/201612:27 PM

## 2014-07-09 ENCOUNTER — Telehealth: Payer: Self-pay | Admitting: *Deleted

## 2014-07-09 ENCOUNTER — Telehealth: Payer: Self-pay | Admitting: Hematology & Oncology

## 2014-07-09 ENCOUNTER — Other Ambulatory Visit: Payer: Self-pay | Admitting: *Deleted

## 2014-07-09 DIAGNOSIS — D509 Iron deficiency anemia, unspecified: Secondary | ICD-10-CM

## 2014-07-09 NOTE — Telephone Encounter (Signed)
Pt aware of 3-11 iron

## 2014-07-09 NOTE — Telephone Encounter (Addendum)
Daughter aware; message sent to scheduler.   ----- Message from Volanda Napoleon, MD sent at 07/06/2014  4:16 PM EST ----- Call her dgtr -- the iron is lower than i expected!!  She needs 1 dose of IV feraheme 510mg  in 2-3 weeks in order to keep blood count up!!!  Please set up!!  pete

## 2014-07-11 ENCOUNTER — Encounter: Payer: Self-pay | Admitting: *Deleted

## 2014-07-20 ENCOUNTER — Ambulatory Visit (HOSPITAL_BASED_OUTPATIENT_CLINIC_OR_DEPARTMENT_OTHER): Payer: Medicare Other

## 2014-07-20 VITALS — BP 135/54 | HR 82 | Temp 97.0°F | Resp 20

## 2014-07-20 DIAGNOSIS — D509 Iron deficiency anemia, unspecified: Secondary | ICD-10-CM

## 2014-07-20 MED ORDER — SODIUM CHLORIDE 0.9 % IV SOLN
INTRAVENOUS | Status: DC
  Administered 2014-07-20: 15:00:00 via INTRAVENOUS

## 2014-07-20 MED ORDER — SODIUM CHLORIDE 0.9 % IV SOLN
510.0000 mg | Freq: Once | INTRAVENOUS | Status: AC
  Administered 2014-07-20: 510 mg via INTRAVENOUS
  Filled 2014-07-20: qty 17

## 2014-07-20 NOTE — Patient Instructions (Signed)

## 2014-08-15 ENCOUNTER — Telehealth: Payer: Self-pay | Admitting: Hematology & Oncology

## 2014-08-15 NOTE — Telephone Encounter (Signed)
Pt left message moved 4-8 to 4-18. I left her message with new date and time

## 2014-08-17 ENCOUNTER — Ambulatory Visit: Payer: Medicare Other | Admitting: Family

## 2014-08-17 ENCOUNTER — Ambulatory Visit: Payer: Medicare Other

## 2014-08-17 ENCOUNTER — Other Ambulatory Visit: Payer: Medicare Other

## 2014-08-27 ENCOUNTER — Encounter: Payer: Self-pay | Admitting: Family

## 2014-08-27 ENCOUNTER — Other Ambulatory Visit (HOSPITAL_BASED_OUTPATIENT_CLINIC_OR_DEPARTMENT_OTHER): Payer: Medicare Other

## 2014-08-27 ENCOUNTER — Ambulatory Visit (HOSPITAL_BASED_OUTPATIENT_CLINIC_OR_DEPARTMENT_OTHER): Payer: Medicare Other | Admitting: Family

## 2014-08-27 ENCOUNTER — Ambulatory Visit: Payer: Medicare Other

## 2014-08-27 VITALS — BP 103/56 | HR 70 | Temp 97.3°F | Resp 16 | Ht 64.0 in | Wt 121.0 lb

## 2014-08-27 DIAGNOSIS — N183 Chronic kidney disease, stage 3 unspecified: Secondary | ICD-10-CM

## 2014-08-27 DIAGNOSIS — N189 Chronic kidney disease, unspecified: Secondary | ICD-10-CM | POA: Diagnosis not present

## 2014-08-27 DIAGNOSIS — D631 Anemia in chronic kidney disease: Secondary | ICD-10-CM

## 2014-08-27 DIAGNOSIS — C3490 Malignant neoplasm of unspecified part of unspecified bronchus or lung: Secondary | ICD-10-CM

## 2014-08-27 DIAGNOSIS — Z85118 Personal history of other malignant neoplasm of bronchus and lung: Secondary | ICD-10-CM

## 2014-08-27 DIAGNOSIS — D509 Iron deficiency anemia, unspecified: Secondary | ICD-10-CM | POA: Diagnosis not present

## 2014-08-27 LAB — CBC WITH DIFFERENTIAL (CANCER CENTER ONLY)
BASO#: 0 10*3/uL (ref 0.0–0.2)
BASO%: 0.3 % (ref 0.0–2.0)
EOS%: 2.6 % (ref 0.0–7.0)
Eosinophils Absolute: 0.2 10*3/uL (ref 0.0–0.5)
HCT: 36.3 % (ref 34.8–46.6)
HGB: 11.9 g/dL (ref 11.6–15.9)
LYMPH#: 1.2 10*3/uL (ref 0.9–3.3)
LYMPH%: 17.7 % (ref 14.0–48.0)
MCH: 28.5 pg (ref 26.0–34.0)
MCHC: 32.8 g/dL (ref 32.0–36.0)
MCV: 87 fL (ref 81–101)
MONO#: 0.6 10*3/uL (ref 0.1–0.9)
MONO%: 9.1 % (ref 0.0–13.0)
NEUT%: 70.3 % (ref 39.6–80.0)
NEUTROS ABS: 4.6 10*3/uL (ref 1.5–6.5)
PLATELETS: 122 10*3/uL — AB (ref 145–400)
RBC: 4.18 10*6/uL (ref 3.70–5.32)
RDW: 17.3 % — AB (ref 11.1–15.7)
WBC: 6.5 10*3/uL (ref 3.9–10.0)

## 2014-08-27 LAB — RETICULOCYTES (CHCC)
ABS RETIC: 50.4 10*3/uL (ref 19.0–186.0)
RBC.: 4.2 MIL/uL (ref 3.87–5.11)
RETIC CT PCT: 1.2 % (ref 0.4–2.3)

## 2014-08-27 NOTE — Progress Notes (Signed)
Hematology and Oncology Follow Up Visit  [name] [id number] 1931-10-03 81 y.o. 08/27/2014   Principle Diagnosis:  Anemia secondary to renal insufficiency Iron deficiency anemia Remote history of limited stage small cell lung cancer  Current Therapy:   Aranesp 300 g subcutaneous as needed for hemoglobin less than 10 IV iron as indicated    Interim History: [name] is here today with her daughter for a follow-up. She had Feraheme in March and is doing well. She has had no problems since her last visit.   She denies fever, chills, n/v, cough, rash, headache, dizziness, SOB, chest pain, abdominal pain, constipation, diarrhea, blood in urine or stool.  No swelling, tenderness, numbness or tingling in her extremities. She has some issues with arthritis. No new aches or pains.  Her appetite is better and she says she is drinking plenty of fluids.She is drinking 1 boost a day. Her weight is stable.   Medications:    Medication List       This list is accurate as of: 08/27/14  2:08 PM.  Always use your most recent med list.               ACCU-CHEK AVIVA PLUS test strip  Generic drug:  glucose blood  1 each by Other route as needed.     acetaminophen 325 MG tablet  Commonly known as:  TYLENOL  Take 650 mg by mouth every 4 (four) hours as needed. FOR PAIN     albuterol 108 (90 BASE) MCG/ACT inhaler  Commonly known as:  PROVENTIL HFA;VENTOLIN HFA  Inhale 1 puff into the lungs 4 (four) times daily.     atorvastatin 10 MG tablet  Commonly known as:  LIPITOR  Take 10 mg by mouth daily. 1/2 daily.     carvedilol 12.5 MG tablet  Commonly known as:  COREG  Take 1 tablet (12.5 mg total) by mouth 2 (two) times daily with a meal.     citalopram 20 MG tablet  Commonly known as:  CELEXA  Take 20 mg by mouth at bedtime.     CITRACAL + D PO  Take 2 tablets by mouth every morning.     Estradiol 10 MCG Tabs vaginal tablet  Place 1 tablet vaginally once a week. GIVEN ON  WEDNESDAYS     niacin 500 MG CR tablet  Commonly known as:  NIASPAN  Take 500 mg by mouth at bedtime.     oxybutynin 5 MG tablet  Commonly known as:  DITROPAN  Take 5 mg by mouth daily.     oxyCODONE-acetaminophen 5-325 MG per tablet  Commonly known as:  PERCOCET/ROXICET  Take 1 tablet by mouth at bedtime as needed.     pantoprazole 40 MG tablet  Commonly known as:  PROTONIX  Take 40 mg by mouth every morning.     polyethylene glycol packet  Commonly known as:  MIRALAX / GLYCOLAX  Take 17 g by mouth every morning.     sennosides-docusate sodium 8.6-50 MG tablet  Commonly known as:  SENOKOT-S  Take 2 tablets by mouth at bedtime.     traZODone 50 MG tablet  Commonly known as:  DESYREL  Take 75 mg by mouth at bedtime.     UNKNOWN TO PATIENT  07-06-14  PT IN ASSISTED LIVING AND DID NOT BRING COPY OF MEDICATION LIST- FAMILY ADVISED TO ALWAYS BRING CURRENT MEDICATION LIST.     vitamin D (CHOLECALCIFEROL) 400 UNITS tablet  Take 800 Units by mouth daily.  Allergies: No Known Allergies  Past Medical History, Surgical history, Social history, and Family History were reviewed and updated.  Review of Systems: All other 10 point review of systems is negative.   Physical Exam:  vitals were not taken for this visit.  Wt Readings from Last 3 Encounters:  07/06/14 123 lb (55.792 kg)  06/15/14 126 lb (57.153 kg)  05/18/14 127 lb (57.607 kg)    Ocular: Sclerae unicteric, pupils equal, round and reactive to light Ear-nose-throat: Oropharynx clear, dentition fair Lymphatic: No cervical or supraclavicular adenopathy Lungs no rales or rhonchi, good excursion bilaterally Heart regular rate and rhythm, no murmur appreciated Abd soft, nontender, positive bowel sounds MSK no focal spinal tenderness, no joint edema Neuro: non-focal, well-oriented, appropriate affect Breasts: Deferred  Lab Results  Component Value Date   WBC 6.5 08/27/2014   HGB 11.9 08/27/2014   HCT 36.3  08/27/2014   MCV 87 08/27/2014   PLT 122* 08/27/2014   Lab Results  Component Value Date   FERRITIN 81 07/06/2014   IRON 50 07/06/2014   TIBC 305 07/06/2014   UIBC 255 07/06/2014   IRONPCTSAT 16* 07/06/2014   Lab Results  Component Value Date   RETICCTPCT 0.9 07/06/2014   RBC 4.18 08/27/2014   RETICCTABS 39.8 07/06/2014   No results found for: KPAFRELGTCHN, LAMBDASER, KAPLAMBRATIO No results found for: Kandis Cocking [facility] Lab Results  Component Value Date   TOTALPROTELP 7.6 10/10/2007   ALBUMINELP 60.9 10/10/2007   A1GS 3.5 10/10/2007   A2GS 10.6 10/10/2007   BETS 5.2 10/10/2007   BETA2SER 4.5 10/10/2007   GAMS 15.3 10/10/2007   MSPIKE NOT DET 10/10/2007   SPEI * 10/10/2007     Chemistry      Component Value Date/Time   NA 143 06/15/2014 1211   NA 140 10/30/2013 0455   K 4.0 06/15/2014 1211   K 3.8 10/30/2013 0455   CL 102 06/15/2014 1211   CL 103 10/30/2013 0455   CO2 28 06/15/2014 1211   CO2 22 10/30/2013 0455   BUN 12 06/15/2014 1211   BUN 20 10/30/2013 0455   CREATININE 1.2 06/15/2014 1211   CREATININE 1.19* 10/30/2013 0455      Component Value Date/Time   CALCIUM 9.2 06/15/2014 1211   CALCIUM 8.7 10/30/2013 0455   ALKPHOS 45 06/15/2014 1211   ALKPHOS 51 10/30/2013 0455   AST 17 06/15/2014 1211   AST 16 10/30/2013 0455   ALT 9* 06/15/2014 1211   ALT 7 10/30/2013 0455   BILITOT 0.50 06/15/2014 1211   BILITOT <0.2* 10/30/2013 0455     Impression and Plan: [name] is a very charming 81 year old white female with a remote history of limited-stage small-cell lung cancer and multifactorial anemia. She underwent chemo and radiation therapy. She also had prophylactic cranial radiation. She is asymptomatic and has no complaints today.  She does not need Aranesp or Feraheme today.   We plan to see her back in 2 months for labs and follow-up.  She knows to call here with any questions or concerns and to go to the ED in the event of an emergency. We  can certainly see her sooner if need be.   Eliezer Bottom, NP 4/18/20162:08 PM

## 2014-08-27 NOTE — Addendum Note (Signed)
Addended by: Trevor Mace on: 08/27/2014 04:36 PM   Modules accepted: Orders, Medications

## 2014-08-28 LAB — IRON AND TIBC CHCC
%SAT: 17 % — ABNORMAL LOW (ref 21–57)
IRON: 44 ug/dL (ref 41–142)
TIBC: 254 ug/dL (ref 236–444)
UIBC: 210 ug/dL (ref 120–384)

## 2014-08-28 LAB — FERRITIN CHCC: Ferritin: 210 ng/ml (ref 9–269)

## 2014-10-18 ENCOUNTER — Telehealth: Payer: Self-pay | Admitting: Hematology & Oncology

## 2014-10-18 NOTE — Telephone Encounter (Signed)
Patient's daughter called and cx 10/22/14 apt and resch for 11/14/14

## 2014-10-22 ENCOUNTER — Ambulatory Visit: Payer: Medicare Other

## 2014-10-22 ENCOUNTER — Other Ambulatory Visit: Payer: Medicare Other

## 2014-10-22 ENCOUNTER — Ambulatory Visit: Payer: Medicare Other | Admitting: Hematology & Oncology

## 2014-11-14 ENCOUNTER — Ambulatory Visit: Payer: Medicare Other

## 2014-11-14 ENCOUNTER — Ambulatory Visit: Payer: Medicare Other | Admitting: Hematology & Oncology

## 2014-11-14 ENCOUNTER — Other Ambulatory Visit: Payer: Medicare Other

## 2015-01-09 ENCOUNTER — Encounter: Payer: Self-pay | Admitting: *Deleted

## 2015-01-19 ENCOUNTER — Encounter (HOSPITAL_COMMUNITY): Payer: Self-pay | Admitting: Emergency Medicine

## 2015-01-19 ENCOUNTER — Observation Stay (HOSPITAL_COMMUNITY)
Admission: EM | Admit: 2015-01-19 | Discharge: 2015-01-21 | Disposition: A | Discharge status: Home / Self Care | Payer: Medicare Other | Attending: Internal Medicine | Admitting: Internal Medicine

## 2015-01-19 ENCOUNTER — Emergency Department (HOSPITAL_COMMUNITY): Payer: Medicare Other

## 2015-01-19 DIAGNOSIS — K5792 Diverticulitis of intestine, part unspecified, without perforation or abscess without bleeding: Secondary | ICD-10-CM | POA: Insufficient documentation

## 2015-01-19 DIAGNOSIS — W19XXXD Unspecified fall, subsequent encounter: Secondary | ICD-10-CM | POA: Diagnosis not present

## 2015-01-19 DIAGNOSIS — E119 Type 2 diabetes mellitus without complications: Secondary | ICD-10-CM | POA: Diagnosis not present

## 2015-01-19 DIAGNOSIS — I480 Paroxysmal atrial fibrillation: Secondary | ICD-10-CM | POA: Diagnosis not present

## 2015-01-19 DIAGNOSIS — W06XXXA Fall from bed, initial encounter: Secondary | ICD-10-CM | POA: Insufficient documentation

## 2015-01-19 DIAGNOSIS — I4891 Unspecified atrial fibrillation: Secondary | ICD-10-CM | POA: Diagnosis not present

## 2015-01-19 DIAGNOSIS — Y9289 Other specified places as the place of occurrence of the external cause: Secondary | ICD-10-CM | POA: Insufficient documentation

## 2015-01-19 DIAGNOSIS — I2510 Atherosclerotic heart disease of native coronary artery without angina pectoris: Secondary | ICD-10-CM | POA: Diagnosis present

## 2015-01-19 DIAGNOSIS — M1990 Unspecified osteoarthritis, unspecified site: Secondary | ICD-10-CM | POA: Diagnosis not present

## 2015-01-19 DIAGNOSIS — R109 Unspecified abdominal pain: Secondary | ICD-10-CM | POA: Diagnosis not present

## 2015-01-19 DIAGNOSIS — M25571 Pain in right ankle and joints of right foot: Secondary | ICD-10-CM | POA: Diagnosis not present

## 2015-01-19 DIAGNOSIS — D631 Anemia in chronic kidney disease: Secondary | ICD-10-CM | POA: Diagnosis not present

## 2015-01-19 DIAGNOSIS — F329 Major depressive disorder, single episode, unspecified: Secondary | ICD-10-CM | POA: Diagnosis not present

## 2015-01-19 DIAGNOSIS — Y998 Other external cause status: Secondary | ICD-10-CM | POA: Insufficient documentation

## 2015-01-19 DIAGNOSIS — S3991XA Unspecified injury of abdomen, initial encounter: Secondary | ICD-10-CM | POA: Diagnosis not present

## 2015-01-19 DIAGNOSIS — N390 Urinary tract infection, site not specified: Secondary | ICD-10-CM | POA: Diagnosis present

## 2015-01-19 DIAGNOSIS — Z950 Presence of cardiac pacemaker: Secondary | ICD-10-CM | POA: Diagnosis not present

## 2015-01-19 DIAGNOSIS — R531 Weakness: Secondary | ICD-10-CM | POA: Insufficient documentation

## 2015-01-19 DIAGNOSIS — W19XXXA Unspecified fall, initial encounter: Secondary | ICD-10-CM | POA: Diagnosis not present

## 2015-01-19 DIAGNOSIS — N183 Chronic kidney disease, stage 3 unspecified: Secondary | ICD-10-CM | POA: Diagnosis present

## 2015-01-19 DIAGNOSIS — D509 Iron deficiency anemia, unspecified: Secondary | ICD-10-CM | POA: Insufficient documentation

## 2015-01-19 DIAGNOSIS — I495 Sick sinus syndrome: Secondary | ICD-10-CM | POA: Diagnosis not present

## 2015-01-19 DIAGNOSIS — Y9389 Activity, other specified: Secondary | ICD-10-CM | POA: Insufficient documentation

## 2015-01-19 DIAGNOSIS — Z79899 Other long term (current) drug therapy: Secondary | ICD-10-CM | POA: Diagnosis not present

## 2015-01-19 DIAGNOSIS — Z87891 Personal history of nicotine dependence: Secondary | ICD-10-CM | POA: Diagnosis not present

## 2015-01-19 DIAGNOSIS — Y92129 Unspecified place in nursing home as the place of occurrence of the external cause: Secondary | ICD-10-CM

## 2015-01-19 DIAGNOSIS — I10 Essential (primary) hypertension: Secondary | ICD-10-CM | POA: Diagnosis present

## 2015-01-19 LAB — URINALYSIS, ROUTINE W REFLEX MICROSCOPIC
Bilirubin Urine: NEGATIVE
Glucose, UA: NEGATIVE mg/dL
Ketones, ur: NEGATIVE mg/dL
NITRITE: POSITIVE — AB
PROTEIN: NEGATIVE mg/dL
SPECIFIC GRAVITY, URINE: 1.016 (ref 1.005–1.030)
UROBILINOGEN UA: 0.2 mg/dL (ref 0.0–1.0)
pH: 6 (ref 5.0–8.0)

## 2015-01-19 LAB — BASIC METABOLIC PANEL
Anion gap: 6 (ref 5–15)
BUN: 24 mg/dL — AB (ref 6–20)
CHLORIDE: 106 mmol/L (ref 101–111)
CO2: 28 mmol/L (ref 22–32)
Calcium: 9.1 mg/dL (ref 8.9–10.3)
Creatinine, Ser: 1.1 mg/dL — ABNORMAL HIGH (ref 0.44–1.00)
GFR calc non Af Amer: 46 mL/min — ABNORMAL LOW (ref >60)
GFR, EST AFRICAN AMERICAN: 53 mL/min — AB (ref >60)
Glucose, Bld: 94 mg/dL (ref 65–99)
POTASSIUM: 3.8 mmol/L (ref 3.5–5.1)
SODIUM: 140 mmol/L (ref 135–145)

## 2015-01-19 LAB — CBC WITH DIFFERENTIAL/PLATELET
Basophils Absolute: 0 10*3/uL (ref 0.0–0.1)
Basophils Relative: 0 % (ref 0–1)
EOS ABS: 0.2 10*3/uL (ref 0.0–0.7)
Eosinophils Relative: 3 % (ref 0–5)
HEMATOCRIT: 33.7 % — AB (ref 36.0–46.0)
HEMOGLOBIN: 11 g/dL — AB (ref 12.0–15.0)
LYMPHS ABS: 1 10*3/uL (ref 0.7–4.0)
Lymphocytes Relative: 15 % (ref 12–46)
MCH: 29.3 pg (ref 26.0–34.0)
MCHC: 32.6 g/dL (ref 30.0–36.0)
MCV: 89.6 fL (ref 78.0–100.0)
MONOS PCT: 6 % (ref 3–12)
Monocytes Absolute: 0.4 10*3/uL (ref 0.1–1.0)
NEUTROS ABS: 5.2 10*3/uL (ref 1.7–7.7)
NEUTROS PCT: 77 % (ref 43–77)
Platelets: 109 10*3/uL — ABNORMAL LOW (ref 150–400)
RBC: 3.76 MIL/uL — AB (ref 3.87–5.11)
RDW: 13.9 % (ref 11.5–15.5)
WBC: 6.8 10*3/uL (ref 4.0–10.5)

## 2015-01-19 LAB — URINE MICROSCOPIC-ADD ON

## 2015-01-19 LAB — GLUCOSE, CAPILLARY
GLUCOSE-CAPILLARY: 93 mg/dL (ref 65–99)
GLUCOSE-CAPILLARY: 91 mg/dL (ref 65–99)

## 2015-01-19 LAB — MRSA PCR SCREENING: MRSA BY PCR: NEGATIVE

## 2015-01-19 MED ORDER — OXYBUTYNIN CHLORIDE 5 MG PO TABS
5.0000 mg | ORAL_TABLET | Freq: Every day | ORAL | Status: DC
  Administered 2015-01-19 – 2015-01-21 (×3): 5 mg via ORAL
  Filled 2015-01-19 (×3): qty 1

## 2015-01-19 MED ORDER — CITALOPRAM HYDROBROMIDE 20 MG PO TABS
20.0000 mg | ORAL_TABLET | Freq: Every day | ORAL | Status: DC
  Administered 2015-01-19 – 2015-01-20 (×2): 20 mg via ORAL
  Filled 2015-01-19 (×2): qty 1

## 2015-01-19 MED ORDER — ALBUTEROL SULFATE (2.5 MG/3ML) 0.083% IN NEBU: 2.5000 mg | INHALATION_SOLUTION | RESPIRATORY_TRACT | Status: DC | PRN

## 2015-01-19 MED ORDER — NIACIN ER (ANTIHYPERLIPIDEMIC) 500 MG PO TBCR
500.0000 mg | EXTENDED_RELEASE_TABLET | Freq: Every day | ORAL | Status: DC
Start: 2015-01-19 — End: 2015-01-21
  Administered 2015-01-19 – 2015-01-20 (×2): 500 mg via ORAL
  Filled 2015-01-19 (×3): qty 1

## 2015-01-19 MED ORDER — OXYCODONE-ACETAMINOPHEN 5-325 MG PO TABS: 1.0000 | ORAL_TABLET | Freq: Three times a day (TID) | ORAL | Status: DC | PRN

## 2015-01-19 MED ORDER — CHOLECALCIFEROL 10 MCG (400 UNIT) PO TABS
800.0000 [IU] | ORAL_TABLET | Freq: Every day | ORAL | Status: DC
  Administered 2015-01-19 – 2015-01-21 (×3): 800 [IU] via ORAL
  Filled 2015-01-19 (×3): qty 2

## 2015-01-19 MED ORDER — ACETAMINOPHEN 650 MG RE SUPP: 650.0000 mg | Freq: Four times a day (QID) | RECTAL | Status: DC | PRN

## 2015-01-19 MED ORDER — TRAZODONE HCL 50 MG PO TABS
75.0000 mg | ORAL_TABLET | Freq: Every day | ORAL | Status: DC
  Administered 2015-01-19 – 2015-01-20 (×2): 75 mg via ORAL
  Filled 2015-01-19 (×2): qty 2

## 2015-01-19 MED ORDER — SENNOSIDES-DOCUSATE SODIUM 8.6-50 MG PO TABS
2.0000 | ORAL_TABLET | Freq: Every day | ORAL | Status: DC
  Administered 2015-01-19 – 2015-01-20 (×2): 2 via ORAL
  Filled 2015-01-19 (×4): qty 2

## 2015-01-19 MED ORDER — ATORVASTATIN CALCIUM 10 MG PO TABS
5.0000 mg | ORAL_TABLET | Freq: Every day | ORAL | Status: DC
  Administered 2015-01-19 – 2015-01-20 (×2): 5 mg via ORAL
  Filled 2015-01-19 (×2): qty 1

## 2015-01-19 MED ORDER — VITAMIN B-12 1000 MCG PO TABS
1000.0000 ug | ORAL_TABLET | Freq: Every day | ORAL | Status: DC
  Administered 2015-01-20 – 2015-01-21 (×2): 1000 ug via ORAL
  Filled 2015-01-19 (×3): qty 1

## 2015-01-19 MED ORDER — ALBUTEROL SULFATE (2.5 MG/3ML) 0.083% IN NEBU
3.0000 mL | INHALATION_SOLUTION | Freq: Four times a day (QID) | RESPIRATORY_TRACT | Status: DC
  Administered 2015-01-19: 3 mL via RESPIRATORY_TRACT
  Filled 2015-01-19: qty 3

## 2015-01-19 MED ORDER — ENOXAPARIN SODIUM 40 MG/0.4ML ~~LOC~~ SOLN
40.0000 mg | SUBCUTANEOUS | Status: DC
  Administered 2015-01-19: 40 mg via SUBCUTANEOUS
  Filled 2015-01-19: qty 0.4

## 2015-01-19 MED ORDER — CARVEDILOL 12.5 MG PO TABS
12.5000 mg | ORAL_TABLET | Freq: Two times a day (BID) | ORAL | Status: DC
  Administered 2015-01-19 – 2015-01-21 (×4): 12.5 mg via ORAL
  Filled 2015-01-19 (×4): qty 1

## 2015-01-19 MED ORDER — ACETAMINOPHEN 325 MG PO TABS
650.0000 mg | ORAL_TABLET | Freq: Four times a day (QID) | ORAL | Status: DC | PRN
  Administered 2015-01-19 – 2015-01-20 (×4): 650 mg via ORAL
  Filled 2015-01-19 (×4): qty 2

## 2015-01-19 MED ORDER — DEXTROSE 5 % IV SOLN
2.0000 g | Freq: Once | INTRAVENOUS | Status: AC
  Administered 2015-01-19: 2 g via INTRAVENOUS
  Filled 2015-01-19 (×2): qty 2

## 2015-01-19 MED ORDER — PANTOPRAZOLE SODIUM 40 MG PO TBEC
40.0000 mg | DELAYED_RELEASE_TABLET | Freq: Every morning | ORAL | Status: DC
  Administered 2015-01-19 – 2015-01-21 (×3): 40 mg via ORAL
  Filled 2015-01-19 (×3): qty 1

## 2015-01-19 MED ORDER — ALBUTEROL SULFATE (2.5 MG/3ML) 0.083% IN NEBU
2.5000 mg | INHALATION_SOLUTION | Freq: Two times a day (BID) | RESPIRATORY_TRACT | Status: DC
  Administered 2015-01-19 – 2015-01-20 (×2): 2.5 mg via RESPIRATORY_TRACT
  Filled 2015-01-19 (×3): qty 3

## 2015-01-19 MED ORDER — CEFTRIAXONE SODIUM 1 G IJ SOLR
1.0000 g | INTRAMUSCULAR | Status: DC
  Administered 2015-01-20 – 2015-01-21 (×2): 1 g via INTRAVENOUS
  Filled 2015-01-19 (×2): qty 10

## 2015-01-19 NOTE — H&P (Signed)
History and Physical  [name] DOB: November 10, 1931 DOA: 01/19/2015  Referring physician: Dr. Carmin Muskrat, EDP  PCP: Reymundo Poll, MD  Outpatient Specialists:  1. Cardiology: Dr. Virl Axe 2. Urology: Dr. Beulah Gandy 3. Oncology:   Chief Complaint: Fall at nursing home and right ankle pain  HPI: [name] is a 81 y.o. female , resident of assisted living facility, widowed, ambulates with the help of a walker, PMH of diet-controlled DM 2, HTN, CAD, A. fib-not on anticoagulation, frequent falls, sick sinus syndrome status post PPM, depression, chronic anemia of iron deficiency and stage III chronic kidney disease, remote history of limited stage small cell lung cancer status post radiation and chemotherapy-in remission, presented to the [facility] on 01/19/15 following a fall at nursing home and right ankle pain. Patient is a reasonable historian. She states that she has sustained multiple falls in the past. Last night she apparently rolled out of bed and fell on the floor. She was unable to get up due to pain in the right ankle. She denies LOC, hitting her head or any other injuries. No bleeding reported. No history of chest pain, palpitations, dizziness or lightheadedness. She states that she does not urinate much in the daytime but has urinary frequency mostly at night associated with incontinence at times. She denies dysuria, foul-smelling urine, fevers or chills. In the ED, vital signs were stable, lab work significant for creatinine 1.10, hemoglobin 11, platelets 109, x-rays of pelvis/right tibia-fibula and right ankle without any bony abnormalities. Upon attempting to walk with a walker, patient made a few steps but was noted to be limping on each step due to pain. Hospitalist admission was requested for evaluation and management of possible UTI and right ankle pain.   Review of Systems: All systems reviewed and apart from history of presenting illness, are  negative.  Past Medical History  Diagnosis Date  . Pacemaker     MDT-DDD  . Sick sinus syndrome     tachy-brady  . Atrial fibrillation   . Arthritis   . CAD (coronary artery disease)   . DM (diabetes mellitus)     type II  . Diverticulitis   . Depression   . Iron deficiency anemia, unspecified 12/30/2012  . Small cell carcinoma of lung 12/30/2012  . Anemia of chronic renal failure, stage 3 (moderate) 06/15/2014   Past Surgical History  Procedure Laterality Date  . Kidney tumor    . Lung cancer surgery    . Breast biopsy    . Pacemaker insertion  ~  . Cholecystectomy     Social History:  reports that she quit smoking about 32 years ago. Her smoking use included Cigarettes. She started smoking about 70 years ago. She has a 120 pack-year smoking history. She has never used smokeless tobacco. She reports that she does not drink alcohol or use illicit drugs. Rest as per history of presenting illness.  No Known Allergies  Family History  Problem Relation Age of Onset  . Diabetes    . Breast cancer    . Alcohol abuse      ADDICTION    Prior to Admission medications   Medication Sig Start Date End Date Taking? Authorizing Provider  ACCU-CHEK AVIVA PLUS test strip 1 each by Other route as needed.  09/28/13  Yes Historical Provider, MD  acetaminophen (TYLENOL) 325 MG tablet Take 650 mg by mouth every 4 (four) hours as needed. FOR PAIN   Yes Historical  Provider, MD  albuterol (PROVENTIL HFA;VENTOLIN HFA) 108 (90 BASE) MCG/ACT inhaler Inhale 1 puff into the lungs 4 (four) times daily.   Yes Historical Provider, MD  atorvastatin (LIPITOR) 10 MG tablet Take 5 mg by mouth daily.  09/30/12  Yes Historical Provider, MD  Calcium Citrate-Vitamin D (CITRACAL + D PO) Take 2 tablets by mouth every morning.    Yes Historical Provider, MD  carvedilol (COREG) 12.5 MG tablet Take 1 tablet (12.5 mg total) by mouth 2 (two) times daily with a meal. 11/01/13  Yes Volanda Napoleon, MD  citalopram (CELEXA)  20 MG tablet Take 20 mg by mouth at bedtime.    Yes Historical Provider, MD  Estradiol 10 MCG TABS Place 1 tablet vaginally once a week. GIVEN ON WEDNESDAYS   Yes Historical Provider, MD  menthol-cetylpyridinium (CEPACOL) 3 MG lozenge Take 1 lozenge by mouth as needed for sore throat.   Yes Historical Provider, MD  niacin (NIASPAN) 500 MG CR tablet Take 500 mg by mouth at bedtime.   Yes Historical Provider, MD  oxybutynin (DITROPAN) 5 MG tablet Take 5 mg by mouth daily.    Yes Historical Provider, MD  oxyCODONE-acetaminophen (PERCOCET/ROXICET) 5-325 MG per tablet Take 1 tablet by mouth at bedtime as needed (for pain).  10/31/12  Yes Historical Provider, MD  pantoprazole (PROTONIX) 40 MG tablet Take 40 mg by mouth every morning.   Yes Historical Provider, MD  sennosides-docusate sodium (SENOKOT-S) 8.6-50 MG tablet Take 2 tablets by mouth at bedtime.   Yes Historical Provider, MD  traZODone (DESYREL) 50 MG tablet Take 75 mg by mouth at bedtime.    Yes Historical Provider, MD  vitamin B-12 (CYANOCOBALAMIN) 1000 MCG tablet Take 1,000 mcg by mouth daily with breakfast.   Yes Historical Provider, MD  vitamin D, CHOLECALCIFEROL, 400 UNITS tablet Take 800 Units by mouth daily.   Yes Historical Provider, MD   Physical Exam: Filed Vitals:   01/19/15 0432 01/19/15 0711 01/19/15 0939  BP: 131/67 150/70 136/47  Pulse: 83 86 70  Temp: 97.9 F (36.6 C) 97.7 F (36.5 C)   TempSrc: Oral Oral   Resp: '18 17 18  '$ SpO2: 100% 100% 100%     General exam: Moderately built and nourished pleasant elderly female patient, lying comfortably supine on the gurney in no obvious distress.  Head, eyes and ENT: Nontraumatic and normocephalic. Pupils equally reacting to light and accommodation. Oral mucosa moist.  Neck: Supple. No JVD, carotid bruit or thyromegaly.  Lymphatics: No lymphadenopathy.  Respiratory system: Clear to auscultation. No increased work of breathing.  Cardiovascular system: S1 and S2 heard, RRR.  No JVD, gallops, clicks or pedal edema. Grade 2/6 systolic ejection murmur best heard at the apex.  Gastrointestinal system: Abdomen is nondistended, soft and nontender. Normal bowel sounds heard. No organomegaly or masses appreciated.  Central nervous system: Alert and oriented to person and place. No focal neurological deficits.  Extremities: Symmetric 5 x 5 power. Peripheral pulses symmetrically felt. Right ankle with mild tenderness and painful range of movements but no swelling, deformity or increased warmth. Mild bruising over distal aspect of left forearm.  Skin: No rashes or acute findings.  Musculoskeletal system: Negative exam.  Psychiatry: Pleasant and cooperative.   Labs on Admission:  Basic Metabolic Panel:  Recent Labs Lab 01/19/15 0759  NA 140  K 3.8  CL 106  CO2 28  GLUCOSE 94  BUN 24*  CREATININE 1.10*  CALCIUM 9.1   Liver Function Tests: No results for input(s):  AST, ALT, ALKPHOS, BILITOT, PROT, ALBUMIN in the last 168 hours. No results for input(s): LIPASE, AMYLASE in the last 168 hours. No results for input(s): AMMONIA in the last 168 hours. CBC:  Recent Labs Lab 01/19/15 0759  WBC 6.8  NEUTROABS 5.2  HGB 11.0*  HCT 33.7*  MCV 89.6  PLT 109*   Cardiac Enzymes: No results for input(s): CKTOTAL, CKMB, CKMBINDEX, TROPONINI in the last 168 hours.  BNP (last 3 results) No results for input(s): PROBNP in the last 8760 hours. CBG: No results for input(s): GLUCAP in the last 168 hours.  Radiological Exams on Admission: Dg Pelvis 1-2 Views  01/19/2015   CLINICAL DATA:  Fall with right hip pain  EXAM: PELVIS - 1-2 VIEW  COMPARISON:  08/29/2012  FINDINGS: Remote proximal right femur fracture status post ORIF with dynamic neck screw. No periprosthetic fracture or dislocation.  No evidence of pelvic ring fracture or diastasis. The left hip is located and appears intact. Patchy sclerosis in the left femoral head consistent with osteonecrosis, present  since at least 2009. No femoral head collapse.  Osteopenia.  IMPRESSION: 1. No acute osseous finding. 2. Stable chronic findings are noted above.   Electronically Signed   By: Monte Fantasia M.D.   On: 01/19/2015 07:05   Dg Tibia/fibula Right  01/19/2015   CLINICAL DATA:  Fall with right ankle pain. Initial encounter.  EXAM: RIGHT TIBIA AND FIBULA - 2 VIEW  COMPARISON:  None.  FINDINGS: There is no evidence of fracture or dislocation.  Osteopenia and atherosclerosis.  There is a femoral nail with tip visualized and unremarkable. Incidental heel spur.  IMPRESSION: No acute osseous finding.   Electronically Signed   By: Monte Fantasia M.D.   On: 01/19/2015 07:02   Dg Ankle Complete Right  01/19/2015   CLINICAL DATA:  [place] in the bathroom at 3 a.m. this morning. No loss of consciousness. Trip and fall injury. Right ankle pain.  EXAM: RIGHT ANKLE - COMPLETE 3+ VIEW  COMPARISON:  None.  FINDINGS: Diffuse bone demineralization. Old ununited ossicles at the medial malleolus. No evidence of acute fracture or dislocation in the right ankle. Plantar calcaneal spur. Vascular calcifications. No focal bone lesion or bone destruction.  IMPRESSION: No acute bony abnormalities.  Diffuse bone demineralization.   Electronically Signed   By: Lucienne Capers M.D.   On: 01/19/2015 05:19    EKG: Independently reviewed. SR, LBBB & LAD (old) and QTC 551 ms  Assessment/Plan Principal Problem:   UTI (lower urinary tract infection) Active Problems:   Atrial fibrillation   Pacemaker-MDT dual   Essential hypertension, benign   Coronary atherosclerosis of native coronary artery   Anemia of chronic renal failure, stage 3 (moderate)   Fall at nursing home   Right ankle pain   Possible UTI - Continue IV Rocephin pending urine culture results.  Mechanical fall/frequent falls and right ankle pain - No fractures on imaging studies. Weightbearing as tolerated. PT evaluation.  Diet-controlled type II DM - Monitor CBGs  and consider SSI if persistently elevated  Essential hypertension - Controlled. Continue carvedilol  PAF/sick sinus syndrome/pacemaker - Currently in sinus rhythm. Continue carvedilol. Not a candidate for anticoagulation secondary to frequent falls  Chronic anemia - Stable. Follow CBCs  Chronic thrombocytopenia - Unclear etiology. Follow CBCs  Lung cancer - In remission. Outpatient follow-up with oncology    DVT prophylaxis: Lovenox Code Status: Full  Family Communication: None at bedside  Disposition Plan: DC back to ALF, possibly 01/20/15  Time spent: 17 minutes  Jamari Moten, MD, FACP, FHM. [facility] Pager [phone]  If 7PM-7AM, please contact night-coverage [email or web address] Password [email or web address] 01/19/2015, 11:01 AM

## 2015-01-19 NOTE — ED Notes (Signed)
RN DRAW

## 2015-01-19 NOTE — ED Notes (Signed)
EKG given to EDP, Horton,MD., for review.

## 2015-01-19 NOTE — Evaluation (Addendum)
Physical Therapy Evaluation Patient Details Name: TONEKA FULLEN MRN: [id number] DOB: 1932/04/14 Today's Date: 01/19/2015   History of Present Illness  81 yo admitted with fall, R LE/ankle pain.  Clinical Impression  On eval, pt required Min assist for mobility-walked ~30 feet with RW. Pt rated pain as "almost a 10" in R LE/ankle when ambulating. Noted R LE tending to give way with WBing-possibly due to pain. Recommend HHPT at ALF as long as facility can provide current level of assist (which may be increased compared to baseline). If facility cannot provide assistance will need to consider snf placement    Follow Up Recommendations Home health PT;Supervision/Assistance - 24 hour (at [facility] as long as facilty can provide current level of care. Pt will likely require increased assistance )    Equipment Recommendations  Rolling walker with 5" wheels    Recommendations for Other Services       Precautions / Restrictions Precautions Precautions: Fall Restrictions Weight Bearing Restrictions: No      Mobility  Bed Mobility Overal bed mobility: Needs Assistance Bed Mobility: Supine to Sit     Supine to sit: Min assist     General bed mobility comments: assist for trunk. Increased time.  Transfers Overall transfer level: Needs assistance Equipment used: Rolling walker (2 wheeled) Transfers: Sit to/from Stand Sit to Stand: Min assist         General transfer comment: assist to rise, stabilize, control descent. vcs safety, technique, hand placement  Ambulation/Gait Ambulation/Gait assistance: Min assist Ambulation Distance (Feet): 30 Feet Assistive device: Rolling walker (2 wheeled) Gait Pattern/deviations: Step-to pattern;Step-through pattern;Antalgic;Decreased step length - right     General Gait Details: assist to stabilize. R LE tends to buckle-likely due to pain but pt also endorses some LE weakness as well. Pt rated pain "almost a 10" with ambulation.   Stairs             Wheelchair Mobility    Modified Rankin (Stroke Patients Only)       Balance Overall balance assessment: Needs assistance;History of Falls         Standing balance support: Bilateral upper extremity supported;During functional activity Standing balance-Leahy Scale: Poor                               Pertinent Vitals/Pain Pain Assessment: 0-10 Pain Score: 9  Pain Location: R LE/ankle with wbing Pain Descriptors / Indicators: Aching;Sore;Tender Pain Intervention(s): Limited activity within patient's tolerance;Repositioned    Home Living Family/patient expects to be discharged to:: Assisted living               Home Equipment: Gilford Rile - 2 wheels      Prior Function                 Hand Dominance        Extremity/Trunk Assessment   Upper Extremity Assessment: Overall WFL for tasks assessed           Lower Extremity Assessment: Generalized weakness;RLE deficits/detail RLE Deficits / Details: strength at least 3/5. R lower leg and ankle TTP.     Cervical / Trunk Assessment: Normal  Communication   Communication: HOH  Cognition Arousal/Alertness: Awake/alert Behavior During Therapy: WFL for tasks assessed/performed Overall Cognitive Status: Within Functional Limits for tasks assessed                      General Comments  Exercises        Assessment/Plan    PT Assessment Patient needs continued PT services  PT Diagnosis Difficulty walking;Abnormality of gait;Generalized weakness;Acute pain   PT Problem List Decreased strength;Decreased activity tolerance;Decreased balance;Decreased mobility;Decreased knowledge of use of DME;Pain  PT Treatment Interventions Gait training;Functional mobility training;Therapeutic activities;DME instruction;Patient/family education;Therapeutic exercise;Balance training   PT Goals (Current goals can be found in the Care Plan section) Acute Rehab PT Goals Patient Stated Goal:  less pain PT Goal Formulation: With patient Time For Goal Achievement: 02/02/15 Potential to Achieve Goals: Good    Frequency Min 3X/week   Barriers to discharge        Co-evaluation               End of Session Equipment Utilized During Treatment: Gait belt Activity Tolerance: Patient limited by fatigue;Patient limited by pain Patient left: in chair;with call bell/phone within reach;with chair alarm set      Functional Assessment Tool Used: clinical judgement Functional Limitation: Mobility: Walking and moving around Mobility: Walking and Moving Around Current Status (P2162): At least 20 percent but less than 40 percent impaired, limited or restricted Mobility: Walking and Moving Around Goal Status [phone]): At least 1 percent but less than 20 percent impaired, limited or restricted    Time: 1430-1453 PT Time Calculation (min) (ACUTE ONLY): 23 min   Charges:   PT Evaluation $Initial PT Evaluation Tier I: 1 Procedure PT Treatments $Gait Training: 8-22 mins   PT G Codes:   PT G-Codes **NOT FOR INPATIENT CLASS** Functional Assessment Tool Used: clinical judgement Functional Limitation: Mobility: Walking and moving around Mobility: Walking and Moving Around Current Status (Q7225): At least 20 percent but less than 40 percent impaired, limited or restricted Mobility: Walking and Moving Around Goal Status [phone]): At least 1 percent but less than 20 percent impaired, limited or restricted    Weston Anna, MPT Pager: [phone]

## 2015-01-19 NOTE — ED Notes (Signed)
Bed: HV74 Expected date:  Expected time:  Means of arrival:  Comments: EMS 89F fall R ankle pain

## 2015-01-19 NOTE — ED Notes (Signed)
Pt. Ambulated within room with a walker, made few steps but noted to be limping on each step, pt. Complained of leg pain upon walking, pt. Also claimed that she felt weak on her knees . Helped back to bed safety.

## 2015-01-19 NOTE — ED Provider Notes (Signed)
CSN: [id number]     Arrival date & time 01/19/15  9735 History   First MD Initiated Contact with Patient 01/19/15 0435     Chief Complaint  Patient presents with  . Fall     (Consider location/radiation/quality/duration/timing/severity/associated sxs/prior Treatment) HPI  This is an 81 year old female who presents following a fall. Patient reports that she fell getting out of bed this morning. She denies hitting her head or loss of consciousness. She rolled out of bed.  Then she crawled to the bathroom to call for help.  She normally walks with a walker. She reports right ankle pain. Denies any chest pain, shortness breath, abdominal pain. Denies any vomiting. Denies any recent fever or illnesses.  Denies syncope.  Pt story to EMS somewhat different reporting a mechanical fall on the way to the [facility].  Past Medical History  Diagnosis Date  . Pacemaker     MDT-DDD  . Sick sinus syndrome     tachy-brady  . Atrial fibrillation   . Arthritis   . CAD (coronary artery disease)   . DM (diabetes mellitus)     type II  . Diverticulitis   . Depression   . Iron deficiency anemia, unspecified 12/30/2012  . Small cell carcinoma of lung 12/30/2012  . Anemia of chronic renal failure, stage 3 (moderate) 06/15/2014   Past Surgical History  Procedure Laterality Date  . Kidney tumor    . Lung cancer surgery    . Breast biopsy    . Pacemaker insertion  ~  . Cholecystectomy     Family History  Problem Relation Age of Onset  . Diabetes    . Breast cancer    . Alcohol abuse      ADDICTION   Social History  Substance Use Topics  . Smoking status: Former Smoker -- 3.00 packs/day for 40 years    Types: Cigarettes    Start date: 09/29/1944    Quit date: 08/30/1982  . Smokeless tobacco: Never Used     Comment: quit 33 years ago  . Alcohol Use: No   OB History    No data available     Review of Systems  Constitutional: Negative for fever.  Respiratory: Negative for cough, chest tightness  and shortness of breath.   Cardiovascular: Negative for chest pain.  Gastrointestinal: Negative for nausea, vomiting and abdominal pain.  Genitourinary: Negative for dysuria.  Musculoskeletal: Negative for back pain.       Right ankle pain  Skin: Negative for wound.  Neurological: Negative for syncope and headaches.  Psychiatric/Behavioral: Negative for confusion.  All other systems reviewed and are negative.     Allergies  Review of patient's allergies indicates no known allergies.  Home Medications   Prior to Admission medications   Medication Sig Start Date End Date Taking? Authorizing Provider  ACCU-CHEK AVIVA PLUS test strip 1 each by Other route as needed.  09/28/13  Yes Historical Provider, MD  acetaminophen (TYLENOL) 325 MG tablet Take 650 mg by mouth every 4 (four) hours as needed. FOR PAIN   Yes Historical Provider, MD  albuterol (PROVENTIL HFA;VENTOLIN HFA) 108 (90 BASE) MCG/ACT inhaler Inhale 1 puff into the lungs 4 (four) times daily.   Yes Historical Provider, MD  atorvastatin (LIPITOR) 10 MG tablet Take 5 mg by mouth daily.  09/30/12  Yes Historical Provider, MD  Calcium Citrate-Vitamin D (CITRACAL + D PO) Take 2 tablets by mouth every morning.    Yes Historical Provider, MD  carvedilol (COREG) 12.5  MG tablet Take 1 tablet (12.5 mg total) by mouth 2 (two) times daily with a meal. 11/01/13  Yes Volanda Napoleon, MD  citalopram (CELEXA) 20 MG tablet Take 20 mg by mouth at bedtime.    Yes Historical Provider, MD  Estradiol 10 MCG TABS Place 1 tablet vaginally once a week. GIVEN ON WEDNESDAYS   Yes Historical Provider, MD  menthol-cetylpyridinium (CEPACOL) 3 MG lozenge Take 1 lozenge by mouth as needed for sore throat.   Yes Historical Provider, MD  niacin (NIASPAN) 500 MG CR tablet Take 500 mg by mouth at bedtime.   Yes Historical Provider, MD  oxybutynin (DITROPAN) 5 MG tablet Take 5 mg by mouth daily.    Yes Historical Provider, MD  oxyCODONE-acetaminophen (PERCOCET/ROXICET)  5-325 MG per tablet Take 1 tablet by mouth at bedtime as needed (for pain).  10/31/12  Yes Historical Provider, MD  pantoprazole (PROTONIX) 40 MG tablet Take 40 mg by mouth every morning.   Yes Historical Provider, MD  sennosides-docusate sodium (SENOKOT-S) 8.6-50 MG tablet Take 2 tablets by mouth at bedtime.   Yes Historical Provider, MD  traZODone (DESYREL) 50 MG tablet Take 75 mg by mouth at bedtime.    Yes Historical Provider, MD  vitamin B-12 (CYANOCOBALAMIN) 1000 MCG tablet Take 1,000 mcg by mouth daily with breakfast.   Yes Historical Provider, MD  vitamin D, CHOLECALCIFEROL, 400 UNITS tablet Take 800 Units by mouth daily.   Yes Historical Provider, MD   BP 131/67 mmHg  Pulse 83  Temp(Src) 97.9 F (36.6 C) (Oral)  Resp 18  SpO2 100% Physical Exam  Constitutional: She is oriented to person, place, and time. No distress.  Elderly  HENT:  Head: Normocephalic and atraumatic.  Eyes: Pupils are equal, round, and reactive to light.  Cardiovascular: Normal rate, regular rhythm and normal heart sounds.   No murmur heard. Pulmonary/Chest: Effort normal and breath sounds normal. No respiratory distress. She has no wheezes.  Abdominal: Soft. Bowel sounds are normal. There is no tenderness. There is no rebound.  Musculoskeletal: She exhibits no edema.  Tenderness palpation over the medial and lateral malleolus of the right ankle, no obvious deformities or swelling, normal range of motion of bilateral hips and knees, 2+ DP pulses  Neurological: She is alert and oriented to person, place, and time.  Cranial nerves II through XII intact, 5 out of 5 strength in all 4 extremities  Skin: Skin is warm and dry.  Psychiatric: She has a normal mood and affect.  Nursing note and vitals reviewed.   ED Course  Procedures (including critical care time) Labs Review Labs Reviewed  URINALYSIS, ROUTINE W REFLEX MICROSCOPIC (NOT AT [facility])    Imaging Review Dg Pelvis 1-2 Views  01/19/2015   CLINICAL  DATA:  Fall with right hip pain  EXAM: PELVIS - 1-2 VIEW  COMPARISON:  08/29/2012  FINDINGS: Remote proximal right femur fracture status post ORIF with dynamic neck screw. No periprosthetic fracture or dislocation.  No evidence of pelvic ring fracture or diastasis. The left hip is located and appears intact. Patchy sclerosis in the left femoral head consistent with osteonecrosis, present since at least 2009. No femoral head collapse.  Osteopenia.  IMPRESSION: 1. No acute osseous finding. 2. Stable chronic findings are noted above.   Electronically Signed   By: Monte Fantasia M.D.   On: 01/19/2015 07:05   Dg Tibia/fibula Right  01/19/2015   CLINICAL DATA:  Fall with right ankle pain. Initial encounter.  EXAM: RIGHT TIBIA  AND FIBULA - 2 VIEW  COMPARISON:  None.  FINDINGS: There is no evidence of fracture or dislocation.  Osteopenia and atherosclerosis.  There is a femoral nail with tip visualized and unremarkable. Incidental heel spur.  IMPRESSION: No acute osseous finding.   Electronically Signed   By: Monte Fantasia M.D.   On: 01/19/2015 07:02   Dg Ankle Complete Right  01/19/2015   CLINICAL DATA:  [place] in the bathroom at 3 a.m. this morning. No loss of consciousness. Trip and fall injury. Right ankle pain.  EXAM: RIGHT ANKLE - COMPLETE 3+ VIEW  COMPARISON:  None.  FINDINGS: Diffuse bone demineralization. Old ununited ossicles at the medial malleolus. No evidence of acute fracture or dislocation in the right ankle. Plantar calcaneal spur. Vascular calcifications. No focal bone lesion or bone destruction.  IMPRESSION: No acute bony abnormalities.  Diffuse bone demineralization.   Electronically Signed   By: Lucienne Capers M.D.   On: 01/19/2015 05:19   I have personally reviewed and evaluated these images and lab results as part of my medical decision-making.   EKG Interpretation   Date/Time:  Saturday January 19 2015 05:29:32 EDT Ventricular Rate:  80 PR Interval:  241 QRS Duration: 138 QT  Interval:  452 QTC Calculation: 521 R Axis:   -7 Text Interpretation:  Sinus rhythm Prolonged PR interval Left bundle  branch block Prior EKG on 07/12/11 with LBBB Confirmed by Dawnell Bryant  MD,  Bryne Lindon ([id number]) on 01/19/2015 5:33:44 AM      MDM   Final diagnoses:  None    Patient presents after a fall.  She is able to provide history but the details are not totally clear.  Denies head injury or LOC.  Not on anticoagulants.  Plain films obtained as well as EKG and screeening urinalysis.  Films of ankle are negative.  Attempted to ambulate the patient but she was very unsteady and complaining of persistent pain.  Additional films ordered as well as basic labwork. Patient with UTI.  Culture sent and given abx.  Patient may require admission if unable to ambulate at her baseline.    Merryl Hacker, MD 01/19/15 [phone]

## 2015-01-19 NOTE — ED Notes (Signed)
Per EMS, pt. From [facility] , complaint of fall in the bathroom at 3am this morning while ambulating from bed to bathroom, pt. Denies LOC, alert and oriented x3, admitted of not using her walker upon ambulation, tripped and fell. Pt. Claimed of right ankle pain at 2/10, no obvious deformity reported.

## 2015-01-20 DIAGNOSIS — M25571 Pain in right ankle and joints of right foot: Secondary | ICD-10-CM | POA: Diagnosis not present

## 2015-01-20 DIAGNOSIS — N390 Urinary tract infection, site not specified: Secondary | ICD-10-CM | POA: Diagnosis not present

## 2015-01-20 DIAGNOSIS — I10 Essential (primary) hypertension: Secondary | ICD-10-CM

## 2015-01-20 LAB — GLUCOSE, CAPILLARY
GLUCOSE-CAPILLARY: 95 mg/dL (ref 65–99)
GLUCOSE-CAPILLARY: 85 mg/dL (ref 65–99)
GLUCOSE-CAPILLARY: 88 mg/dL (ref 65–99)
Glucose-Capillary: 129 mg/dL — ABNORMAL HIGH (ref 65–99)

## 2015-01-20 LAB — CBC
HEMATOCRIT: 33 % — AB (ref 36.0–46.0)
HEMOGLOBIN: 10.8 g/dL — AB (ref 12.0–15.0)
MCH: 29.1 pg (ref 26.0–34.0)
MCHC: 32.7 g/dL (ref 30.0–36.0)
MCV: 88.9 fL (ref 78.0–100.0)
Platelets: 106 10*3/uL — ABNORMAL LOW (ref 150–400)
RBC: 3.71 MIL/uL — ABNORMAL LOW (ref 3.87–5.11)
RDW: 13.8 % (ref 11.5–15.5)
WBC: 5 10*3/uL (ref 4.0–10.5)

## 2015-01-20 MED ORDER — ALBUTEROL SULFATE (2.5 MG/3ML) 0.083% IN NEBU: 2.5000 mg | INHALATION_SOLUTION | Freq: Four times a day (QID) | RESPIRATORY_TRACT | Status: DC | PRN

## 2015-01-20 NOTE — Progress Notes (Signed)
PROGRESS NOTE    [name] GGE:[id number] DOB: 26-Oct-1931 DOA: 01/19/2015 PCP: Reymundo Poll, MD  Outpatient Specialists:  1. Cardiology: Dr. Virl Axe 2. Urology: Dr. Beulah Gandy 3. Oncology:  HPI/Brief narrative 81 y.o. female , resident of assisted living facility, widowed, ambulates with the help of a walker, PMH of diet-controlled DM 2, HTN, CAD, A. fib-not on anticoagulation, frequent falls, sick sinus syndrome status post PPM, depression, chronic anemia of iron deficiency and stage III chronic kidney disease, remote history of limited stage small cell lung cancer status post radiation and chemotherapy-in remission, presented to the [facility] on 01/19/15 following a fall at nursing home and right ankle pain. Admitted for possible UTI and right ankle pain without fracture sustained post fall.   Assessment/Plan:  Possible UTI - Continue IV Rocephin pending urine culture results-in process.  Mechanical fall/frequent falls and right ankle pain - No fractures on imaging studies. Weightbearing as tolerated. PT evaluation. As per PT, home health PT, supervision/assistance-24 hours (may return to ALF as long as can't provide that level of recommended care)  Diet-controlled type II DM - Monitor CBGs and consider SSI if persistently elevated. Controlled  Essential hypertension - Controlled. Continue carvedilol  PAF/sick sinus syndrome/pacemaker - Currently in sinus rhythm. Continue carvedilol. Not a candidate for anticoagulation secondary to frequent falls  Chronic anemia - Stable.   Chronic thrombocytopenia - Unclear etiology. Stable  Lung cancer - In remission. Outpatient follow-up with oncology   DVT prophylaxis: Lovenox Code Status: Full  Family Communication: None at bedside  Disposition Plan: DC back to ALF, possibly 01/21/15    Consultants:   None  Procedures:   None  Antibiotics:  IV Rocephin 9/10 >   Subjective: Denies complaints.  Right ankle pain is better. As per nursing, no acute events.  Objective: Filed Vitals:   01/19/15 2042 01/19/15 2220 01/20/15 0424 01/20/15 0734  BP: 97/48  143/76   Pulse: 71  74   Temp: 98.2 F (36.8 C)  97.4 F (36.3 C)   TempSrc: Oral  Oral   Resp: 16  16   Height:  '5\' 1"'$  (1.549 m)    Weight:  56.291 kg (124 lb 1.6 oz)    SpO2: 99%  97% 99%    Intake/Output Summary (Last 24 hours) at 01/20/15 1041 Last data filed at 01/20/15 0900  Gross per 24 hour  Intake    480 ml  Output    150 ml  Net    330 ml   Filed Weights   01/19/15 2220  Weight: 56.291 kg (124 lb 1.6 oz)     Exam:  General exam: present elderly female lying comfortably in bed Respiratory system: Clear. No increased work of breathing. Cardiovascular system: S1 & S2 heard, RRR. No JVD, murmurs, gallops, clicks or pedal edema. Gastrointestinal system: Abdomen is nondistended, soft and nontender. Normal bowel sounds heard. Central nervous system: Alert and oriented. No focal neurological deficits. Extremities: Symmetric 5 x 5 power. Right ankle with minimal tenderness in painful range of movements but no swelling or other acute signs of inflammation.    Data Reviewed: Basic Metabolic Panel:  Recent Labs Lab 01/19/15 0759  NA 140  K 3.8  CL 106  CO2 28  GLUCOSE 94  BUN 24*  CREATININE 1.10*  CALCIUM 9.1   Liver Function Tests: No results for input(s): AST, ALT, ALKPHOS, BILITOT, PROT, ALBUMIN in the last 168 hours. No results for input(s): LIPASE, AMYLASE in the last 168 hours.  No results for input(s): AMMONIA in the last 168 hours. CBC:  Recent Labs Lab 01/19/15 0759 01/20/15 0500  WBC 6.8 5.0  NEUTROABS 5.2  --   HGB 11.0* 10.8*  HCT 33.7* 33.0*  MCV 89.6 88.9  PLT 109* 106*   Cardiac Enzymes: No results for input(s): CKTOTAL, CKMB, CKMBINDEX, TROPONINI in the last 168 hours. BNP (last 3 results) No results for input(s): PROBNP in the last 8760 hours. CBG:  Recent Labs Lab  01/19/15 1725 01/19/15 2138 01/20/15 0719  GLUCAP 93 91 95    Recent Results (from the past 240 hour(s))  MRSA PCR Screening     Status: None   Collection Time: 01/19/15  6:15 PM  Result Value Ref Range Status   MRSA by PCR NEGATIVE NEGATIVE Final    Comment:        The GeneXpert MRSA Assay (FDA approved for NASAL specimens only), is one component of a comprehensive MRSA colonization surveillance program. It is not intended to diagnose MRSA infection nor to guide or monitor treatment for MRSA infections.            Studies: Dg Pelvis 1-2 Views  01/19/2015   CLINICAL DATA:  Fall with right hip pain  EXAM: PELVIS - 1-2 VIEW  COMPARISON:  08/29/2012  FINDINGS: Remote proximal right femur fracture status post ORIF with dynamic neck screw. No periprosthetic fracture or dislocation.  No evidence of pelvic ring fracture or diastasis. The left hip is located and appears intact. Patchy sclerosis in the left femoral head consistent with osteonecrosis, present since at least 2009. No femoral head collapse.  Osteopenia.  IMPRESSION: 1. No acute osseous finding. 2. Stable chronic findings are noted above.   Electronically Signed   By: Monte Fantasia M.D.   On: 01/19/2015 07:05   Dg Tibia/fibula Right  01/19/2015   CLINICAL DATA:  Fall with right ankle pain. Initial encounter.  EXAM: RIGHT TIBIA AND FIBULA - 2 VIEW  COMPARISON:  None.  FINDINGS: There is no evidence of fracture or dislocation.  Osteopenia and atherosclerosis.  There is a femoral nail with tip visualized and unremarkable. Incidental heel spur.  IMPRESSION: No acute osseous finding.   Electronically Signed   By: Monte Fantasia M.D.   On: 01/19/2015 07:02   Dg Ankle Complete Right  01/19/2015   CLINICAL DATA:  [place] in the bathroom at 3 a.m. this morning. No loss of consciousness. Trip and fall injury. Right ankle pain.  EXAM: RIGHT ANKLE - COMPLETE 3+ VIEW  COMPARISON:  None.  FINDINGS: Diffuse bone demineralization. Old  ununited ossicles at the medial malleolus. No evidence of acute fracture or dislocation in the right ankle. Plantar calcaneal spur. Vascular calcifications. No focal bone lesion or bone destruction.  IMPRESSION: No acute bony abnormalities.  Diffuse bone demineralization.   Electronically Signed   By: Lucienne Capers M.D.   On: 01/19/2015 05:19        Scheduled Meds: . albuterol  2.5 mg Nebulization BID  . atorvastatin  5 mg Oral Q2000  . carvedilol  12.5 mg Oral BID WC  . cefTRIAXone (ROCEPHIN)  IV  1 g Intravenous Q24H  . cholecalciferol  800 Units Oral Daily  . citalopram  20 mg Oral QHS  . enoxaparin (LOVENOX) injection  40 mg Subcutaneous Q24H  . niacin  500 mg Oral QHS  . oxybutynin  5 mg Oral Daily  . pantoprazole  40 mg Oral q morning - 10a  . senna-docusate  2 tablet  Oral QHS  . traZODone  75 mg Oral QHS  . vitamin B-12  1,000 mcg Oral Q breakfast   Continuous Infusions:   Principal Problem:   UTI (lower urinary tract infection) Active Problems:   Atrial fibrillation   Pacemaker-MDT dual   Essential hypertension, benign   Coronary atherosclerosis of native coronary artery   Anemia of chronic renal failure, stage 3 (moderate)   Fall at nursing home   Right ankle pain    Time spent: 25 minutes.    Vernell Leep, MD, FACP, FHM. [facility] Pager [phone]  If 7PM-7AM, please contact night-coverage [email or web address] Password [facility] 01/20/2015, 10:41 AM

## 2015-01-21 DIAGNOSIS — M25571 Pain in right ankle and joints of right foot: Secondary | ICD-10-CM | POA: Diagnosis not present

## 2015-01-21 DIAGNOSIS — N390 Urinary tract infection, site not specified: Secondary | ICD-10-CM | POA: Diagnosis not present

## 2015-01-21 DIAGNOSIS — W19XXXD Unspecified fall, subsequent encounter: Secondary | ICD-10-CM

## 2015-01-21 LAB — GLUCOSE, CAPILLARY
GLUCOSE-CAPILLARY: 114 mg/dL — AB (ref 65–99)
GLUCOSE-CAPILLARY: 92 mg/dL (ref 65–99)

## 2015-01-21 MED ORDER — ACETAMINOPHEN 325 MG PO TABS: 650.0000 mg | ORAL_TABLET | Freq: Four times a day (QID) | ORAL | Status: AC | PRN

## 2015-01-21 MED ORDER — CEFUROXIME AXETIL 250 MG PO TABS: 250.0000 mg | ORAL_TABLET | Freq: Two times a day (BID) | ORAL | Status: AC

## 2015-01-21 MED ORDER — OXYCODONE-ACETAMINOPHEN 5-325 MG PO TABS: 1.0000 | ORAL_TABLET | Freq: Three times a day (TID) | ORAL | Status: DC | PRN

## 2015-01-21 NOTE — Progress Notes (Signed)
Called report to [facility] at [facility].  Patient transported back to [facility] via private vehicle with daughter.

## 2015-01-21 NOTE — Progress Notes (Signed)
CSW received notification that pt admitted from [place] and medically ready for discharge today.  CSW contacted [facility] via telephone to discuss pt. CSW reviewed PT evaluation with [facility] and facility confirmed that they could accept pt back today. [facility] request that pt have [facility] RN and PT at [place] through [place].   CSW notified MD and RNCM.  CSW facilitated pt discharge needs including contacting facility, faxing pt discharge information, confirming facility received and reviewed discharge information and pt can return, pt daughter at bedside and confirmed that she could transport pt back to [facility]. CSW provided pt daughter with discharge packet to provide to [facility] upon arrival to the facility.   No further social work needs identified at this time.  CSW signing off.   Alison Murray, MSW, [facility] [phone]

## 2015-01-21 NOTE — Discharge Summary (Signed)
Physician Discharge Summary  [name] VXB:[id number] DOB: 07-18-1931 DOA: 01/19/2015  PCP: Reymundo Poll, MD  Outpatient Specialists:   Cardiology: Dr. Virl Axe  Urology: Dr. Beulah Gandy  Oncology:  Admit date: 01/19/2015 Discharge date: 01/21/2015  Time spent: Less than 30 minutes  Recommendations for Outpatient Follow-up:  1. M.D. at [facility] in 5 days with repeat labs (CBC & BMP). Please follow final urine culture results that were sent from the hospital. 2. Home health RN and PT  Discharge Diagnoses:  Principal Problem:   UTI (lower urinary tract infection) Active Problems:   Atrial fibrillation   Pacemaker-MDT dual   Essential hypertension, benign   Coronary atherosclerosis of native coronary artery   Anemia of chronic renal failure, stage 3 (moderate)   Fall at nursing home   Right ankle pain   Discharge Condition: Improved & Stable  Diet recommendation:  Heart healthy diet & diabetic diet.  Filed Weights   01/19/15 2220  Weight: 56.291 kg (124 lb 1.6 oz)    History of present illness:  81 y.o. female , resident of assisted living facility, widowed, ambulates with the help of a walker, PMH of diet-controlled DM 2, HTN, CAD, A. fib-not on anticoagulation, frequent falls, sick sinus syndrome status post PPM, depression, chronic anemia of iron deficiency and stage III chronic kidney disease, remote history of limited stage small cell lung cancer status post radiation and chemotherapy-in remission, presented to the [facility] on 01/19/15 following a fall at nursing home and right ankle pain. Admitted for possible UTI and right ankle pain without fracture sustained post fall.  Possible UTI - Continue IV Rocephin pending urine culture results-in process. Completed 3 days of Rocephin today. Will DC on empiric oral Ceftin to complete total 7 days treatment. Follow urine culture results as outpatient.  Mechanical fall/frequent falls and right ankle pain - No  fractures on imaging studies. Weightbearing as tolerated. PT evaluation. As per PT, home health PT, supervision/assistance-24 hours (may return to ALF as long as can't provide that level of recommended care) - As per clinical social worker, patient may return to previous ALF with home health PT and RN.  Diet-controlled type II DM - Monitor CBGs and consider SSI if persistently elevated. Controlled  Essential hypertension - Controlled. Continue carvedilol  PAF/sick sinus syndrome/pacemaker - Currently in sinus rhythm. Continue carvedilol. Not a candidate for anticoagulation secondary to frequent falls  Chronic anemia - Stable.   Chronic thrombocytopenia - Unclear etiology. Stable  Lung cancer - In remission. Outpatient follow-up with oncology   Consultants:  None  Procedures:  None  Antibiotics:  IV Rocephin 9/10 >  Discharge Exam:  Complaints:  Right ankle pain has progressively improved. Denies any other complaints. As per nursing, no acute events reported.  Filed Vitals:   01/20/15 0734 01/20/15 1429 01/20/15 2135 01/21/15 0622  BP:  104/66 100/62 102/68  Pulse:  104 88 90  Temp:  98 F (36.7 C) 98 F (36.7 C) 97.9 F (36.6 C)  TempSrc:  Oral Oral Oral  Resp:  '16 18 18  '$ Height:      Weight:      SpO2: 99% 100% 100% 100%    General exam:pleasant elderly female lying comfortably in bed. Respiratory system: Clear. No increased work of breathing. Cardiovascular system: S1 & S2 heard, RRR. No JVD, murmurs, gallops, clicks or pedal edema. Gastrointestinal system: Abdomen is nondistended, soft and nontender. Normal bowel sounds heard. Central nervous system: Alert and oriented. No focal neurological  deficits. Extremities: Symmetric 5 x 5 power. No further tenderness, painful range of movements or other acute findings in right ankle.  Discharge Instructions      Discharge Instructions    Call MD for:  difficulty breathing, headache or visual  disturbances    Complete by:  As directed      Call MD for:  extreme fatigue    Complete by:  As directed      Call MD for:  hives    Complete by:  As directed      Call MD for:  persistant dizziness or light-headedness    Complete by:  As directed      Call MD for:  persistant nausea and vomiting    Complete by:  As directed      Call MD for:  severe uncontrolled pain    Complete by:  As directed      Call MD for:  temperature >100.4    Complete by:  As directed      Diet - low sodium heart healthy    Complete by:  As directed      Diet Carb Modified    Complete by:  As directed      Increase activity slowly    Complete by:  As directed             Medication List    TAKE these medications        ACCU-CHEK AVIVA PLUS test strip  Generic drug:  glucose blood  1 each by Other route as needed.     acetaminophen 325 MG tablet  Commonly known as:  TYLENOL  Take 2 tablets (650 mg total) by mouth every 6 (six) hours as needed for mild pain, moderate pain, fever or headache.     albuterol 108 (90 BASE) MCG/ACT inhaler  Commonly known as:  PROVENTIL HFA;VENTOLIN HFA  Inhale 1 puff into the lungs 4 (four) times daily.     atorvastatin 10 MG tablet  Commonly known as:  LIPITOR  Take 5 mg by mouth daily.     carvedilol 12.5 MG tablet  Commonly known as:  COREG  Take 1 tablet (12.5 mg total) by mouth 2 (two) times daily with a meal.     cefUROXime 250 MG tablet  Commonly known as:  CEFTIN  Take 1 tablet (250 mg total) by mouth 2 (two) times daily with a meal. Start 01/22/15 for 4 days and stop after 01/25/15 doses.  Start taking on:  01/22/2015     citalopram 20 MG tablet  Commonly known as:  CELEXA  Take 20 mg by mouth at bedtime.     CITRACAL + D PO  Take 2 tablets by mouth every morning.     Estradiol 10 MCG Tabs vaginal tablet  Place 1 tablet vaginally once a week. GIVEN ON WEDNESDAYS     menthol-cetylpyridinium 3 MG lozenge  Commonly known as:  CEPACOL  Take 1  lozenge by mouth as needed for sore throat.     niacin 500 MG CR tablet  Commonly known as:  NIASPAN  Take 500 mg by mouth at bedtime.     oxybutynin 5 MG tablet  Commonly known as:  DITROPAN  Take 5 mg by mouth daily.     oxyCODONE-acetaminophen 5-325 MG per tablet  Commonly known as:  PERCOCET/ROXICET  Take 1 tablet by mouth every 8 (eight) hours as needed for severe pain.     pantoprazole 40 MG tablet  Commonly  known as:  PROTONIX  Take 40 mg by mouth every morning.     sennosides-docusate sodium 8.6-50 MG tablet  Commonly known as:  SENOKOT-S  Take 2 tablets by mouth at bedtime.     traZODone 50 MG tablet  Commonly known as:  DESYREL  Take 75 mg by mouth at bedtime.     vitamin B-12 1000 MCG tablet  Commonly known as:  CYANOCOBALAMIN  Take 1,000 mcg by mouth daily with breakfast.     vitamin D (CHOLECALCIFEROL) 400 UNITS tablet  Take 800 Units by mouth daily.          The results of significant diagnostics from this hospitalization (including imaging, microbiology, ancillary and laboratory) are listed below for reference.    Significant Diagnostic Studies: Dg Pelvis 1-2 Views  01/19/2015   CLINICAL DATA:  Fall with right hip pain  EXAM: PELVIS - 1-2 VIEW  COMPARISON:  08/29/2012  FINDINGS: Remote proximal right femur fracture status post ORIF with dynamic neck screw. No periprosthetic fracture or dislocation.  No evidence of pelvic ring fracture or diastasis. The left hip is located and appears intact. Patchy sclerosis in the left femoral head consistent with osteonecrosis, present since at least 2009. No femoral head collapse.  Osteopenia.  IMPRESSION: 1. No acute osseous finding. 2. Stable chronic findings are noted above.   Electronically Signed   By: Monte Fantasia M.D.   On: 01/19/2015 07:05   Dg Tibia/fibula Right  01/19/2015   CLINICAL DATA:  Fall with right ankle pain. Initial encounter.  EXAM: RIGHT TIBIA AND FIBULA - 2 VIEW  COMPARISON:  None.  FINDINGS:  There is no evidence of fracture or dislocation.  Osteopenia and atherosclerosis.  There is a femoral nail with tip visualized and unremarkable. Incidental heel spur.  IMPRESSION: No acute osseous finding.   Electronically Signed   By: Monte Fantasia M.D.   On: 01/19/2015 07:02   Dg Ankle Complete Right  01/19/2015   CLINICAL DATA:  [place] in the bathroom at 3 a.m. this morning. No loss of consciousness. Trip and fall injury. Right ankle pain.  EXAM: RIGHT ANKLE - COMPLETE 3+ VIEW  COMPARISON:  None.  FINDINGS: Diffuse bone demineralization. Old ununited ossicles at the medial malleolus. No evidence of acute fracture or dislocation in the right ankle. Plantar calcaneal spur. Vascular calcifications. No focal bone lesion or bone destruction.  IMPRESSION: No acute bony abnormalities.  Diffuse bone demineralization.   Electronically Signed   By: Lucienne Capers M.D.   On: 01/19/2015 05:19    Microbiology: Recent Results (from the past 240 hour(s))  MRSA PCR Screening     Status: None   Collection Time: 01/19/15  6:15 PM  Result Value Ref Range Status   MRSA by PCR NEGATIVE NEGATIVE Final    Comment:        The GeneXpert MRSA Assay (FDA approved for NASAL specimens only), is one component of a comprehensive MRSA colonization surveillance program. It is not intended to diagnose MRSA infection nor to guide or monitor treatment for MRSA infections.      Labs: Basic Metabolic Panel:  Recent Labs Lab 01/19/15 0759  NA 140  K 3.8  CL 106  CO2 28  GLUCOSE 94  BUN 24*  CREATININE 1.10*  CALCIUM 9.1   Liver Function Tests: No results for input(s): AST, ALT, ALKPHOS, BILITOT, PROT, ALBUMIN in the last 168 hours. No results for input(s): LIPASE, AMYLASE in the last 168 hours. No results for input(s): AMMONIA  in the last 168 hours. CBC:  Recent Labs Lab 01/19/15 0759 01/20/15 0500  WBC 6.8 5.0  NEUTROABS 5.2  --   HGB 11.0* 10.8*  HCT 33.7* 33.0*  MCV 89.6 88.9  PLT 109* 106*    Cardiac Enzymes: No results for input(s): CKTOTAL, CKMB, CKMBINDEX, TROPONINI in the last 168 hours. BNP: BNP (last 3 results) No results for input(s): BNP in the last 8760 hours.  ProBNP (last 3 results) No results for input(s): PROBNP in the last 8760 hours.  CBG:  Recent Labs Lab 01/20/15 0719 01/20/15 1158 01/20/15 1716 01/20/15 2117 01/21/15 0726  GLUCAP 95 85 88 129* 114*       Signed:  Vernell Leep, MD, FACP, FHM. [facility] Pager [phone]  If 7PM-7AM, please contact night-coverage [email or web address] Password [facility] 01/21/2015, 11:26 AM

## 2015-01-21 NOTE — Care Management Note (Signed)
Case Management Note  Patient Details  Name: [name] MRN: [id number] Date of Birth: Mar 17, 1932  Subjective/Objective:              81 yo admitted with UTI      Action/Plan: From [facility]  Expected Discharge Date:                  Expected Discharge Plan:  Assisted Living / Rest Home  In-House Referral:  Clinical Social Work  Discharge planning Services  CM Consult  Post Acute Care Choice:    Choice offered to:  Patient  DME Arranged:    DME Agency:     HH Arranged:  RN, PT [place] Agency:  [facility]  Status of Service:  Completed, signed off  Medicare Important Message Given:    Date Medicare IM Given:    Medicare IM give by:    Date Additional Medicare IM Given:    Additional Medicare Important Message give by:     If discussed at [place] of Stay Meetings, dates discussed:    Additional Comments: Pt to return to [facility] with HHPT/RN. The facility uses [facility] for it's home health services. Pt offered choice for [facility] and was agreeable to use [facility]. Referral called to [facility] rep. No other DC needs noted. Lynnell Catalan, RN 01/21/2015, 1:17 PM

## 2015-01-22 LAB — URINE CULTURE

## 2015-01-28 ENCOUNTER — Encounter (HOSPITAL_COMMUNITY): Payer: Self-pay | Admitting: Emergency Medicine

## 2015-01-28 ENCOUNTER — Emergency Department (HOSPITAL_COMMUNITY): Payer: Medicare Other

## 2015-01-28 ENCOUNTER — Emergency Department (HOSPITAL_COMMUNITY)
Admission: EM | Admit: 2015-01-28 | Discharge: 2015-01-29 | Disposition: A | Discharge status: Home / Self Care | Payer: Medicare Other | Attending: Emergency Medicine | Admitting: Emergency Medicine

## 2015-01-28 DIAGNOSIS — I4891 Unspecified atrial fibrillation: Secondary | ICD-10-CM | POA: Diagnosis not present

## 2015-01-28 DIAGNOSIS — Y998 Other external cause status: Secondary | ICD-10-CM | POA: Insufficient documentation

## 2015-01-28 DIAGNOSIS — I2510 Atherosclerotic heart disease of native coronary artery without angina pectoris: Secondary | ICD-10-CM | POA: Diagnosis not present

## 2015-01-28 DIAGNOSIS — Z8719 Personal history of other diseases of the digestive system: Secondary | ICD-10-CM | POA: Insufficient documentation

## 2015-01-28 DIAGNOSIS — Z862 Personal history of diseases of the blood and blood-forming organs and certain disorders involving the immune mechanism: Secondary | ICD-10-CM | POA: Diagnosis not present

## 2015-01-28 DIAGNOSIS — W050XXA Fall from non-moving wheelchair, initial encounter: Secondary | ICD-10-CM | POA: Insufficient documentation

## 2015-01-28 DIAGNOSIS — E119 Type 2 diabetes mellitus without complications: Secondary | ICD-10-CM | POA: Insufficient documentation

## 2015-01-28 DIAGNOSIS — S3991XA Unspecified injury of abdomen, initial encounter: Secondary | ICD-10-CM | POA: Diagnosis present

## 2015-01-28 DIAGNOSIS — Z950 Presence of cardiac pacemaker: Secondary | ICD-10-CM | POA: Insufficient documentation

## 2015-01-28 DIAGNOSIS — M1990 Unspecified osteoarthritis, unspecified site: Secondary | ICD-10-CM | POA: Diagnosis not present

## 2015-01-28 DIAGNOSIS — Y9389 Activity, other specified: Secondary | ICD-10-CM | POA: Insufficient documentation

## 2015-01-28 DIAGNOSIS — Y9289 Other specified places as the place of occurrence of the external cause: Secondary | ICD-10-CM | POA: Insufficient documentation

## 2015-01-28 DIAGNOSIS — S301XXA Contusion of abdominal wall, initial encounter: Secondary | ICD-10-CM | POA: Insufficient documentation

## 2015-01-28 DIAGNOSIS — N183 Chronic kidney disease, stage 3 (moderate): Secondary | ICD-10-CM | POA: Insufficient documentation

## 2015-01-28 DIAGNOSIS — Z87891 Personal history of nicotine dependence: Secondary | ICD-10-CM | POA: Insufficient documentation

## 2015-01-28 DIAGNOSIS — Z85118 Personal history of other malignant neoplasm of bronchus and lung: Secondary | ICD-10-CM | POA: Insufficient documentation

## 2015-01-28 DIAGNOSIS — Z79899 Other long term (current) drug therapy: Secondary | ICD-10-CM | POA: Insufficient documentation

## 2015-01-28 LAB — URINALYSIS, ROUTINE W REFLEX MICROSCOPIC
Bilirubin Urine: NEGATIVE
GLUCOSE, UA: NEGATIVE mg/dL
Ketones, ur: NEGATIVE mg/dL
Nitrite: NEGATIVE
PH: 7 (ref 5.0–8.0)
Protein, ur: NEGATIVE mg/dL
Specific Gravity, Urine: 1.011 (ref 1.005–1.030)
Urobilinogen, UA: 0.2 mg/dL (ref 0.0–1.0)

## 2015-01-28 LAB — CBC WITH DIFFERENTIAL/PLATELET
BASOS ABS: 0 10*3/uL (ref 0.0–0.1)
Basophils Relative: 0 %
Eosinophils Absolute: 0.2 10*3/uL (ref 0.0–0.7)
Eosinophils Relative: 3 %
HEMATOCRIT: 33 % — AB (ref 36.0–46.0)
Hemoglobin: 10.8 g/dL — ABNORMAL LOW (ref 12.0–15.0)
LYMPHS PCT: 17 %
Lymphs Abs: 1.1 10*3/uL (ref 0.7–4.0)
MCH: 28.8 pg (ref 26.0–34.0)
MCHC: 32.7 g/dL (ref 30.0–36.0)
MCV: 88 fL (ref 78.0–100.0)
MONO ABS: 0.5 10*3/uL (ref 0.1–1.0)
MONOS PCT: 8 %
NEUTROS ABS: 4.6 10*3/uL (ref 1.7–7.7)
Neutrophils Relative %: 72 %
Platelets: 124 10*3/uL — ABNORMAL LOW (ref 150–400)
RBC: 3.75 MIL/uL — ABNORMAL LOW (ref 3.87–5.11)
RDW: 13.8 % (ref 11.5–15.5)
WBC: 6.4 10*3/uL (ref 4.0–10.5)

## 2015-01-28 LAB — I-STAT CHEM 8, ED
BUN: 22 mg/dL — AB (ref 6–20)
CREATININE: 1 mg/dL (ref 0.44–1.00)
Calcium, Ion: 1.19 mmol/L (ref 1.13–1.30)
Chloride: 100 mmol/L — ABNORMAL LOW (ref 101–111)
GLUCOSE: 82 mg/dL (ref 65–99)
HCT: 34 % — ABNORMAL LOW (ref 36.0–46.0)
HEMOGLOBIN: 11.6 g/dL — AB (ref 12.0–15.0)
POTASSIUM: 4 mmol/L (ref 3.5–5.1)
Sodium: 139 mmol/L (ref 135–145)
TCO2: 23 mmol/L (ref 0–100)

## 2015-01-28 LAB — URINE MICROSCOPIC-ADD ON

## 2015-01-28 MED ORDER — IOHEXOL 300 MG/ML  SOLN
100.0000 mL | Freq: Once | INTRAMUSCULAR | Status: AC | PRN
  Administered 2015-01-28: 100 mL via INTRAVENOUS

## 2015-01-28 MED ORDER — ACETAMINOPHEN 325 MG PO TABS
650.0000 mg | ORAL_TABLET | Freq: Once | ORAL | Status: AC
  Administered 2015-01-28: 650 mg via ORAL
  Filled 2015-01-28: qty 2

## 2015-01-28 NOTE — ED Notes (Signed)
Pt is from [facility].  She forgot to lock her wheelchair and landed on her Right side.  C/o pain in Right lower ribs.  No LOC, No blood thinners.  Denies neck or back pain.  CBD 89

## 2015-01-28 NOTE — ED Notes (Signed)
Bed: WA08 Expected date:  Expected time:  Means of arrival:  Comments: EMS- 81yo F, R ribcage pain/fell out of wheelchair

## 2015-01-28 NOTE — ED Provider Notes (Signed)
CSN: [id number]     Arrival date & time 01/28/15  1803 History   First MD Initiated Contact with Patient 01/28/15 1844     No chief complaint on file.    (Consider location/radiation/quality/duration/timing/severity/associated sxs/prior Treatment) HPI Comments: Patient states she was trying to get into her wheelchair and thought it was locked.  It slid out from under her.  She landed on her right side.  She is now having pain in her right abdomen and lower ribs.  The history is provided by the patient.    Past Medical History  Diagnosis Date  . Pacemaker     MDT-DDD  . Sick sinus syndrome     tachy-brady  . Atrial fibrillation   . Arthritis   . CAD (coronary artery disease)   . DM (diabetes mellitus)     type II  . Diverticulitis   . Depression   . Iron deficiency anemia, unspecified 12/30/2012  . Small cell carcinoma of lung 12/30/2012  . Anemia of chronic renal failure, stage 3 (moderate) 06/15/2014   Past Surgical History  Procedure Laterality Date  . Kidney tumor    . Lung cancer surgery    . Breast biopsy    . Pacemaker insertion  ~  . Cholecystectomy     Family History  Problem Relation Age of Onset  . Diabetes    . Breast cancer    . Alcohol abuse      ADDICTION   Social History  Substance Use Topics  . Smoking status: Former Smoker -- 3.00 packs/day for 40 years    Types: Cigarettes    Start date: 09/29/1944    Quit date: 08/30/1982  . Smokeless tobacco: Never Used     Comment: quit 33 years ago  . Alcohol Use: No   OB History    No data available     Review of Systems  Constitutional: Negative for fever and chills.  Respiratory: Negative for shortness of breath.   Cardiovascular: Negative for chest pain.  Gastrointestinal: Positive for abdominal pain. Negative for nausea and vomiting.  Musculoskeletal: Negative for back pain and neck pain.  Skin: Negative for wound.  All other systems reviewed and are negative.     Allergies  Review of  patient's allergies indicates no known allergies.  Home Medications   Prior to Admission medications   Medication Sig Start Date End Date Taking? Authorizing Provider  acetaminophen (TYLENOL) 325 MG tablet Take 2 tablets (650 mg total) by mouth every 6 (six) hours as needed for mild pain, moderate pain, fever or headache. 01/21/15  Yes Modena Jansky, MD  Albuterol Sulfate 108 (90 BASE) MCG/ACT AEPB Inhale 1 puff into the lungs 4 (four) times daily.   Yes Historical Provider, MD  atorvastatin (LIPITOR) 10 MG tablet Take 5 mg by mouth daily.  09/30/12  Yes Historical Provider, MD  Calcium Citrate-Vitamin D (CITRACAL + D PO) Take 2 tablets by mouth every morning.    Yes Historical Provider, MD  citalopram (CELEXA) 20 MG tablet Take 20 mg by mouth at bedtime.    Yes Historical Provider, MD  donepezil (ARICEPT) 5 MG tablet Take 5 mg by mouth at bedtime.   Yes Historical Provider, MD  famotidine (PEPCID) 20 MG tablet Take 20 mg by mouth at bedtime.   Yes Historical Provider, MD  niacin (NIASPAN) 500 MG CR tablet Take 500 mg by mouth at bedtime.   Yes Historical Provider, MD  oxybutynin (DITROPAN) 5 MG tablet Take 5 mg  by mouth daily.    Yes Historical Provider, MD  oxyCODONE-acetaminophen (PERCOCET/ROXICET) 5-325 MG per tablet Take 1 tablet by mouth every 8 (eight) hours as needed for severe pain. 01/21/15  Yes Modena Jansky, MD  sennosides-docusate sodium (SENOKOT-S) 8.6-50 MG tablet Take 2 tablets by mouth at bedtime.   Yes Historical Provider, MD  traZODone (DESYREL) 50 MG tablet Take 75 mg by mouth at bedtime.    Yes Historical Provider, MD  ACCU-CHEK AVIVA PLUS test strip 1 each by Other route as needed.  09/28/13   Historical Provider, MD  carvedilol (COREG) 12.5 MG tablet Take 1 tablet (12.5 mg total) by mouth 2 (two) times daily with a meal. 11/01/13   Volanda Napoleon, MD  Estradiol 10 MCG TABS Place 1 tablet vaginally once a week. GIVEN ON WEDNESDAYS    Historical Provider, MD    menthol-cetylpyridinium (CEPACOL) 3 MG lozenge Take 1 lozenge by mouth as needed for sore throat.    Historical Provider, MD   BP 148/89 mmHg  Pulse 63  Temp(Src) 98.2 F (36.8 C) (Oral)  Resp 18  SpO2 100% Physical Exam  Constitutional: She is oriented to person, place, and time. She appears well-developed and well-nourished.  HENT:  Head: Normocephalic.  Eyes: Pupils are equal, round, and reactive to light.  Neck: Normal range of motion. No spinous process tenderness and no muscular tenderness present. Normal range of motion present.  Cardiovascular: Normal rate and regular rhythm.   Pulmonary/Chest: Effort normal and breath sounds normal. No respiratory distress. She has no rales. She exhibits no tenderness.  Abdominal: Soft. She exhibits no distension. There is tenderness.    Musculoskeletal: Normal range of motion. She exhibits no edema or tenderness.  Neurological: She is alert and oriented to person, place, and time.  Skin: Skin is warm. There is erythema.     Nursing note and vitals reviewed.   ED Course  Procedures (including critical care time) Labs Review Labs Reviewed  URINALYSIS, ROUTINE W REFLEX MICROSCOPIC (NOT AT [facility]) - Abnormal; Notable for the following:    Hgb urine dipstick TRACE (*)    Leukocytes, UA SMALL (*)    All other components within normal limits  CBC WITH DIFFERENTIAL/PLATELET - Abnormal; Notable for the following:    RBC 3.75 (*)    Hemoglobin 10.8 (*)    HCT 33.0 (*)    Platelets 124 (*)    All other components within normal limits  I-STAT CHEM 8, ED - Abnormal; Notable for the following:    Chloride 100 (*)    BUN 22 (*)    Hemoglobin 11.6 (*)    HCT 34.0 (*)    All other components within normal limits  URINE MICROSCOPIC-ADD ON    Imaging Review Ct Abdomen Pelvis W Contrast  01/29/2015   CLINICAL DATA:  Fall, right rib pain  EXAM: CT ABDOMEN AND PELVIS WITH CONTRAST  TECHNIQUE: Multidetector CT imaging of the abdomen and pelvis  was performed using the standard protocol following bolus administration of intravenous contrast.  CONTRAST:  149m OMNIPAQUE IOHEXOL 300 MG/ML  SOLN  COMPARISON:  CT abdomen pelvis dated 04/11/2007  FINDINGS: Lower chest:  Lung bases are clear.  Pacemaker leads, incompletely visualized.  Hepatobiliary: Liver is within normal limits. No suspicious/enhancing hepatic lesions.  Status post cholecystectomy. No intrahepatic or extrahepatic ductal dilatation.  Pancreas: Pancreatic atrophy.  Spleen: Within normal limits.  Adrenals/Urinary Tract: Adrenal glands are within normal limits.  Scattered areas of bilateral renal cortical scarring. 8  mm cyst in the medial left upper kidney (series 3/ image 13). No hydronephrosis.  Bladder is mildly thick-walled anteriorly (Series 2/ image 80), although underdistended.  Stomach/Bowel: Stomach is notable for a moderate hiatal hernia.  No evidence of bowel obstruction.  Normal appendix.  Colonic diverticulosis, without evidence of diverticulitis.  Vascular/Lymphatic: Atherosclerotic calcifications of the abdominal aorta and branch vessels.  No suspicious abdominopelvic lymphadenopathy.  Reproductive: Status post hysterectomy.  Bilateral ovaries are within normal limits.  Other: No abdominopelvic ascites.  No hemoperitoneum or free air.  Musculoskeletal: Mild degenerative changes of the lumbar spine, most prominent at L5-S1.  Mild superior endplate changes at L3, unchanged since 2008.  No fracture is seen. Specifically, no acute right rib fracture is seen. Old/chronic deformities of the right anterolateral 4th and 5th ribs (series 2/images 3 and 7).  Status post ORIF of the right hip. Sclerosis involving the bilateral femoral heads, suggesting bilateral hip AVN, unchanged since 2008.  IMPRESSION: No evidence of traumatic injury to the abdomen/pelvis.  Specifically, no acute right rib fracture is seen.  Chronic ancillary findings as above.   Electronically Signed   By: Julian Hy  M.D.   On: 01/29/2015 00:19   I have personally reviewed and evaluated these images and lab results as part of my medical decision-making.   EKG Interpretation None     CT scan reviewed.  Reviewed urine results reviewed.  Histopathology explain patient's discomfort.  Fortunately, there are no fractures or internal organ injuries.  Patient was comfortable with Tylenol.  She was discharged home to her facility.  This was explained to her and her daughter, who are in agreement with plan MDM   Final diagnoses:  Abdominal wall contusion, initial encounter         Junius Creamer, NP 01/29/15 6629  Leonard Schwartz, MD 01/31/15 1102

## 2015-01-28 NOTE — ED Notes (Signed)
CT notified this RN that the IV for this Pt is causing pain when she attempts to inject IV contrast.  Blood return is noted int he IV catheter but it appears to be positional.

## 2015-01-28 NOTE — ED Notes (Signed)
CT notified of Pts new IV placement.

## 2015-01-28 NOTE — ED Notes (Signed)
Patient transported to CT 

## 2015-01-29 NOTE — Discharge Instructions (Signed)
Contusion A contusion is a deep bruise. Contusions happen when an injury causes bleeding under the skin. Signs of bruising include pain, puffiness (swelling), and discolored skin. The contusion may turn blue, purple, or yellow. HOME CARE   Put ice on the injured area.  Put ice in a plastic bag.  Place a towel between your skin and the bag.  Leave the ice on for 15-20 minutes, 03-04 times a day.  Only take medicine as told by your doctor.  Rest the injured area.  If possible, raise (elevate) the injured area to lessen puffiness. GET HELP RIGHT AWAY IF:   You have more bruising or puffiness.  You have pain that is getting worse.  Your puffiness or pain is not helped by medicine. MAKE SURE YOU:   Understand these instructions.  Will watch your condition.  Will get help right away if you are not doing well or get worse. Document Released: 10/14/2007 Document Revised: 07/20/2011 Document Reviewed: 03/02/2011 [facility] Patient Information 2015 [place], [place]. This information is not intended to replace advice given to you by your health care provider. Make sure you discuss any questions you have with your health care provider. The CT scan of your abdomen is normal.  You can take Tylenol or ibuprofen for discomfort

## 2015-02-06 ENCOUNTER — Encounter: Payer: Self-pay | Admitting: *Deleted

## 2015-02-23 ENCOUNTER — Inpatient Hospital Stay (HOSPITAL_COMMUNITY)
Admission: EM | Admit: 2015-02-23 | Discharge: 2015-03-01 | DRG: 308 | Disposition: A | Discharge status: Hospice | Payer: Medicare Other | Attending: Internal Medicine | Admitting: Internal Medicine

## 2015-02-23 ENCOUNTER — Encounter (HOSPITAL_COMMUNITY): Payer: Self-pay | Admitting: Emergency Medicine

## 2015-02-23 ENCOUNTER — Encounter (HOSPITAL_COMMUNITY)
Admission: EM | Disposition: A | Discharge status: Hospice | Payer: Self-pay | Source: Home / Self Care | Attending: Internal Medicine

## 2015-02-23 ENCOUNTER — Emergency Department (HOSPITAL_COMMUNITY): Payer: Medicare Other

## 2015-02-23 DIAGNOSIS — T18128A Food in esophagus causing other injury, initial encounter: Secondary | ICD-10-CM | POA: Diagnosis not present

## 2015-02-23 DIAGNOSIS — D631 Anemia in chronic kidney disease: Secondary | ICD-10-CM | POA: Diagnosis present

## 2015-02-23 DIAGNOSIS — J690 Pneumonitis due to inhalation of food and vomit: Secondary | ICD-10-CM | POA: Diagnosis present

## 2015-02-23 DIAGNOSIS — K449 Diaphragmatic hernia without obstruction or gangrene: Secondary | ICD-10-CM | POA: Diagnosis present

## 2015-02-23 DIAGNOSIS — R233 Spontaneous ecchymoses: Secondary | ICD-10-CM | POA: Diagnosis present

## 2015-02-23 DIAGNOSIS — Z950 Presence of cardiac pacemaker: Secondary | ICD-10-CM | POA: Diagnosis present

## 2015-02-23 DIAGNOSIS — N183 Chronic kidney disease, stage 3 unspecified: Secondary | ICD-10-CM | POA: Diagnosis present

## 2015-02-23 DIAGNOSIS — M1990 Unspecified osteoarthritis, unspecified site: Secondary | ICD-10-CM | POA: Diagnosis present

## 2015-02-23 DIAGNOSIS — I130 Hypertensive heart and chronic kidney disease with heart failure and stage 1 through stage 4 chronic kidney disease, or unspecified chronic kidney disease: Secondary | ICD-10-CM | POA: Diagnosis present

## 2015-02-23 DIAGNOSIS — R0602 Shortness of breath: Secondary | ICD-10-CM

## 2015-02-23 DIAGNOSIS — I10 Essential (primary) hypertension: Secondary | ICD-10-CM | POA: Diagnosis present

## 2015-02-23 DIAGNOSIS — E1122 Type 2 diabetes mellitus with diabetic chronic kidney disease: Secondary | ICD-10-CM | POA: Diagnosis present

## 2015-02-23 DIAGNOSIS — Z803 Family history of malignant neoplasm of breast: Secondary | ICD-10-CM

## 2015-02-23 DIAGNOSIS — J9601 Acute respiratory failure with hypoxia: Secondary | ICD-10-CM | POA: Diagnosis not present

## 2015-02-23 DIAGNOSIS — I5032 Chronic diastolic (congestive) heart failure: Secondary | ICD-10-CM | POA: Diagnosis present

## 2015-02-23 DIAGNOSIS — K222 Esophageal obstruction: Secondary | ICD-10-CM

## 2015-02-23 DIAGNOSIS — F32A Depression, unspecified: Secondary | ICD-10-CM | POA: Diagnosis present

## 2015-02-23 DIAGNOSIS — R1310 Dysphagia, unspecified: Secondary | ICD-10-CM | POA: Diagnosis present

## 2015-02-23 DIAGNOSIS — Z79899 Other long term (current) drug therapy: Secondary | ICD-10-CM

## 2015-02-23 DIAGNOSIS — Z66 Do not resuscitate: Secondary | ICD-10-CM | POA: Diagnosis present

## 2015-02-23 DIAGNOSIS — R0600 Dyspnea, unspecified: Secondary | ICD-10-CM

## 2015-02-23 DIAGNOSIS — R7989 Other specified abnormal findings of blood chemistry: Secondary | ICD-10-CM

## 2015-02-23 DIAGNOSIS — X58XXXA Exposure to other specified factors, initial encounter: Secondary | ICD-10-CM | POA: Diagnosis present

## 2015-02-23 DIAGNOSIS — D509 Iron deficiency anemia, unspecified: Secondary | ICD-10-CM | POA: Diagnosis present

## 2015-02-23 DIAGNOSIS — Z638 Other specified problems related to primary support group: Secondary | ICD-10-CM

## 2015-02-23 DIAGNOSIS — F329 Major depressive disorder, single episode, unspecified: Secondary | ICD-10-CM | POA: Diagnosis present

## 2015-02-23 DIAGNOSIS — F0390 Unspecified dementia without behavioral disturbance: Secondary | ICD-10-CM | POA: Diagnosis present

## 2015-02-23 DIAGNOSIS — Z923 Personal history of irradiation: Secondary | ICD-10-CM

## 2015-02-23 DIAGNOSIS — Z22322 Carrier or suspected carrier of Methicillin resistant Staphylococcus aureus: Secondary | ICD-10-CM

## 2015-02-23 DIAGNOSIS — Z515 Encounter for palliative care: Secondary | ICD-10-CM | POA: Diagnosis not present

## 2015-02-23 DIAGNOSIS — D72829 Elevated white blood cell count, unspecified: Secondary | ICD-10-CM | POA: Diagnosis present

## 2015-02-23 DIAGNOSIS — I2510 Atherosclerotic heart disease of native coronary artery without angina pectoris: Secondary | ICD-10-CM | POA: Diagnosis present

## 2015-02-23 DIAGNOSIS — Z9181 History of falling: Secondary | ICD-10-CM

## 2015-02-23 DIAGNOSIS — I480 Paroxysmal atrial fibrillation: Principal | ICD-10-CM | POA: Diagnosis present

## 2015-02-23 DIAGNOSIS — Z7982 Long term (current) use of aspirin: Secondary | ICD-10-CM

## 2015-02-23 DIAGNOSIS — I4891 Unspecified atrial fibrillation: Secondary | ICD-10-CM

## 2015-02-23 DIAGNOSIS — Z833 Family history of diabetes mellitus: Secondary | ICD-10-CM

## 2015-02-23 DIAGNOSIS — I248 Other forms of acute ischemic heart disease: Secondary | ICD-10-CM | POA: Diagnosis present

## 2015-02-23 DIAGNOSIS — T18108A Unspecified foreign body in esophagus causing other injury, initial encounter: Secondary | ICD-10-CM

## 2015-02-23 DIAGNOSIS — D638 Anemia in other chronic diseases classified elsewhere: Secondary | ICD-10-CM | POA: Diagnosis present

## 2015-02-23 DIAGNOSIS — C3490 Malignant neoplasm of unspecified part of unspecified bronchus or lung: Secondary | ICD-10-CM | POA: Diagnosis present

## 2015-02-23 DIAGNOSIS — Z9221 Personal history of antineoplastic chemotherapy: Secondary | ICD-10-CM

## 2015-02-23 DIAGNOSIS — I5033 Acute on chronic diastolic (congestive) heart failure: Secondary | ICD-10-CM | POA: Diagnosis present

## 2015-02-23 DIAGNOSIS — R778 Other specified abnormalities of plasma proteins: Secondary | ICD-10-CM | POA: Diagnosis present

## 2015-02-23 DIAGNOSIS — E785 Hyperlipidemia, unspecified: Secondary | ICD-10-CM | POA: Diagnosis present

## 2015-02-23 DIAGNOSIS — F419 Anxiety disorder, unspecified: Secondary | ICD-10-CM | POA: Diagnosis present

## 2015-02-23 DIAGNOSIS — Z87891 Personal history of nicotine dependence: Secondary | ICD-10-CM

## 2015-02-23 DIAGNOSIS — I495 Sick sinus syndrome: Secondary | ICD-10-CM | POA: Diagnosis present

## 2015-02-23 DIAGNOSIS — Z79891 Long term (current) use of opiate analgesic: Secondary | ICD-10-CM

## 2015-02-23 HISTORY — DX: Carrier or suspected carrier of methicillin resistant Staphylococcus aureus: Z22.322

## 2015-02-23 HISTORY — PX: ESOPHAGOGASTRODUODENOSCOPY: SHX5428

## 2015-02-23 LAB — BASIC METABOLIC PANEL
Anion gap: 9 (ref 5–15)
BUN: 19 mg/dL (ref 6–20)
CHLORIDE: 105 mmol/L (ref 101–111)
CO2: 25 mmol/L (ref 22–32)
CREATININE: 1.24 mg/dL — AB (ref 0.44–1.00)
Calcium: 9.5 mg/dL (ref 8.9–10.3)
GFR, EST AFRICAN AMERICAN: 46 mL/min — AB (ref >60)
GFR, EST NON AFRICAN AMERICAN: 40 mL/min — AB (ref >60)
Glucose, Bld: 129 mg/dL — ABNORMAL HIGH (ref 65–99)
Potassium: 4.1 mmol/L (ref 3.5–5.1)
SODIUM: 139 mmol/L (ref 135–145)

## 2015-02-23 LAB — CBC WITH DIFFERENTIAL/PLATELET
BASOS PCT: 0 %
Basophils Absolute: 0 10*3/uL (ref 0.0–0.1)
EOS ABS: 0.3 10*3/uL (ref 0.0–0.7)
EOS PCT: 3 %
HCT: 41.9 % (ref 36.0–46.0)
HEMOGLOBIN: 13.9 g/dL (ref 12.0–15.0)
LYMPHS ABS: 2.2 10*3/uL (ref 0.7–4.0)
Lymphocytes Relative: 20 %
MCH: 29.2 pg (ref 26.0–34.0)
MCHC: 33.2 g/dL (ref 30.0–36.0)
MCV: 88 fL (ref 78.0–100.0)
Monocytes Absolute: 0.6 10*3/uL (ref 0.1–1.0)
Monocytes Relative: 5 %
NEUTROS PCT: 72 %
Neutro Abs: 7.7 10*3/uL (ref 1.7–7.7)
PLATELETS: 215 10*3/uL (ref 150–400)
RBC: 4.76 MIL/uL (ref 3.87–5.11)
RDW: 13.7 % (ref 11.5–15.5)
WBC: 10.7 10*3/uL — AB (ref 4.0–10.5)

## 2015-02-23 LAB — TROPONIN I: TROPONIN I: 0.09 ng/mL — AB (ref <0.031)

## 2015-02-23 SURGERY — EGD (ESOPHAGOGASTRODUODENOSCOPY)
Anesthesia: Moderate Sedation

## 2015-02-23 MED ORDER — DIPHENHYDRAMINE HCL 50 MG/ML IJ SOLN
INTRAMUSCULAR | Status: AC
  Filled 2015-02-23: qty 1

## 2015-02-23 MED ORDER — METOPROLOL TARTRATE 1 MG/ML IV SOLN
5.0000 mg | Freq: Once | INTRAVENOUS | Status: AC
  Administered 2015-02-23: 5 mg via INTRAVENOUS
  Filled 2015-02-23: qty 5

## 2015-02-23 MED ORDER — MIDAZOLAM HCL 5 MG/ML IJ SOLN
INTRAMUSCULAR | Status: AC
  Filled 2015-02-23: qty 2

## 2015-02-23 MED ORDER — GLUCAGON HCL RDNA (DIAGNOSTIC) 1 MG IJ SOLR
1.0000 mg | Freq: Once | INTRAMUSCULAR | Status: AC
  Administered 2015-02-23: 1 mg via INTRAVENOUS
  Filled 2015-02-23: qty 1

## 2015-02-23 MED ORDER — STERILE WATER FOR INJECTION IJ SOLN
INTRAMUSCULAR | Status: AC
  Filled 2015-02-23: qty 10

## 2015-02-23 MED ORDER — FENTANYL CITRATE (PF) 100 MCG/2ML IJ SOLN
INTRAMUSCULAR | Status: AC
  Filled 2015-02-23: qty 2

## 2015-02-23 MED ORDER — METOPROLOL TARTRATE 1 MG/ML IV SOLN
2.5000 mg | Freq: Once | INTRAVENOUS | Status: AC
  Administered 2015-02-23: 2.5 mg via INTRAVENOUS
  Filled 2015-02-23: qty 5

## 2015-02-23 MED ORDER — METOPROLOL TARTRATE 1 MG/ML IV SOLN
2.5000 mg | Freq: Once | INTRAVENOUS | Status: AC
  Administered 2015-02-23: 2.5 mg via INTRAVENOUS

## 2015-02-23 MED ORDER — SODIUM CHLORIDE 0.9 % IV BOLUS (SEPSIS)
500.0000 mL | Freq: Once | INTRAVENOUS | Status: AC
  Administered 2015-02-23: 500 mL via INTRAVENOUS

## 2015-02-23 NOTE — ED Notes (Addendum)
Pt from [facility] via [place] c/o choking on chicken. Pt has copious amounts of saliva present, no actual vomiting. Per [facility] pt tachycardic at 150. Hx of afib and sick sinus. Wheezing lungs sounds. Pt talking clearly.

## 2015-02-23 NOTE — ED Provider Notes (Signed)
CSN: [id number]     Arrival date & time 02/23/15  2110 History   First MD Initiated Contact with Patient 02/23/15 2115     Chief Complaint  Patient presents with  . foreign object in throat      (Consider location/radiation/quality/duration/timing/severity/associated sxs/prior Treatment) HPI Comments: Patient here complaining of foreign body sensation in her esophagus after eating chicken. Has been unable to control her saliva. He does not complain of chest pain or shortness of breath. Denies palpitations. EMS was called and patient found to be in A. fib with RVR up to 160. Patient transported here for evaluation. Denies any prior history of esophageal stricture.  The history is provided by the patient.    Past Medical History  Diagnosis Date  . Pacemaker     MDT-DDD  . Sick sinus syndrome (HCC)     tachy-brady  . Atrial fibrillation ([place])   . Arthritis   . CAD (coronary artery disease)   . DM (diabetes mellitus) ([place])     type II  . Diverticulitis   . Depression   . Iron deficiency anemia, unspecified 12/30/2012  . Small cell carcinoma of lung ([place]) 12/30/2012  . Anemia of chronic renal failure, stage 3 (moderate) 06/15/2014   Past Surgical History  Procedure Laterality Date  . Kidney tumor    . Lung cancer surgery    . Breast biopsy    . Pacemaker insertion  ~  . Cholecystectomy     Family History  Problem Relation Age of Onset  . Diabetes    . Breast cancer    . Alcohol abuse      ADDICTION   Social History  Substance Use Topics  . Smoking status: Former Smoker -- 3.00 packs/day for 40 years    Types: Cigarettes    Start date: 09/29/1944    Quit date: 08/30/1982  . Smokeless tobacco: Never Used     Comment: quit 33 years ago  . Alcohol Use: No   OB History    No data available     Review of Systems  All other systems reviewed and are negative.     Allergies  Review of patient's allergies indicates no known allergies.  Home Medications   Prior to  Admission medications   Medication Sig Start Date End Date Taking? Authorizing Provider  acetaminophen (TYLENOL) 325 MG tablet Take 2 tablets (650 mg total) by mouth every 6 (six) hours as needed for mild pain, moderate pain, fever or headache. 01/21/15  Yes Modena Jansky, MD  Albuterol Sulfate 108 (90 BASE) MCG/ACT AEPB Inhale 1 puff into the lungs 4 (four) times daily.   Yes Historical Provider, MD  aspirin EC 81 MG tablet Take 81 mg by mouth daily.   Yes Historical Provider, MD  atorvastatin (LIPITOR) 10 MG tablet Take 5 mg by mouth daily.  09/30/12  Yes Historical Provider, MD  Calcium Citrate-Vitamin D (CITRACAL + D PO) Take 2 tablets by mouth every morning.    Yes Historical Provider, MD  carvedilol (COREG) 6.25 MG tablet Take 6.25 mg by mouth 2 (two) times daily with a meal.   Yes Historical Provider, MD  citalopram (CELEXA) 20 MG tablet Take 20 mg by mouth at bedtime.    Yes Historical Provider, MD  donepezil (ARICEPT) 5 MG tablet Take 5 mg by mouth at bedtime.   Yes Historical Provider, MD  Estradiol 10 MCG TABS Place 1 tablet vaginally once a week. GIVEN ON [facility]   Yes Historical Provider,  MD  fentaNYL (DURAGESIC - DOSED MCG/HR) 25 MCG/HR patch Place 25 mcg onto the skin every 3 (three) days.   Yes Historical Provider, MD  niacin (NIASPAN) 500 MG CR tablet Take 500 mg by mouth at bedtime.   Yes Historical Provider, MD  oxybutynin (DITROPAN) 5 MG tablet Take 5 mg by mouth daily.    Yes Historical Provider, MD  oxyCODONE-acetaminophen (PERCOCET/ROXICET) 5-325 MG per tablet Take 1 tablet by mouth every 8 (eight) hours as needed for severe pain. 01/21/15  Yes Modena Jansky, MD  sennosides-docusate sodium (SENOKOT-S) 8.6-50 MG tablet Take 2 tablets by mouth at bedtime.   Yes Historical Provider, MD  traZODone (DESYREL) 50 MG tablet Take 100 mg by mouth at bedtime.    Yes Historical Provider, MD  vitamin B-12 (CYANOCOBALAMIN) 1000 MCG tablet Take 1,000 mcg by mouth daily.   Yes  Historical Provider, MD  carvedilol (COREG) 12.5 MG tablet Take 1 tablet (12.5 mg total) by mouth 2 (two) times daily with a meal. Patient not taking: Reported on 02/23/2015 11/01/13   Volanda Napoleon, MD  menthol-cetylpyridinium (CEPACOL) 3 MG lozenge Take 1 lozenge by mouth as needed for sore throat.    Historical Provider, MD   BP 123/85 mmHg  Pulse 106  Temp(Src) 98.4 F (36.9 C) (Oral)  Resp 21  SpO2 97% Physical Exam  Constitutional: She is oriented to person, place, and time. She appears well-developed and well-nourished.  Non-toxic appearance. No distress.  HENT:  Head: Normocephalic and atraumatic.  Eyes: Conjunctivae, EOM and lids are normal. Pupils are equal, round, and reactive to light.  Neck: Normal range of motion. Neck supple. No tracheal deviation present. No thyroid mass present.  Cardiovascular: Normal heart sounds.  An irregularly irregular rhythm present. Tachycardia present.  Exam reveals no gallop.   No murmur heard. Pulmonary/Chest: Effort normal and breath sounds normal. No stridor. No respiratory distress. She has no decreased breath sounds. She has no wheezes. She has no rhonchi. She has no rales.  Abdominal: Soft. Normal appearance and bowel sounds are normal. She exhibits no distension. There is no tenderness. There is no rebound and no CVA tenderness.  Musculoskeletal: Normal range of motion. She exhibits no edema or tenderness.  Neurological: She is alert and oriented to person, place, and time. She has normal strength. No cranial nerve deficit or sensory deficit. GCS eye subscore is 4. GCS verbal subscore is 5. GCS motor subscore is 6.  Skin: Skin is warm and dry. No abrasion and no rash noted.  Psychiatric: She has a normal mood and affect. Her speech is normal and behavior is normal.  Nursing note and vitals reviewed.   ED Course  Procedures (including critical care time) Labs Review Labs Reviewed  CBC WITH DIFFERENTIAL/PLATELET - Abnormal; Notable for  the following:    WBC 10.7 (*)    All other components within normal limits  BASIC METABOLIC PANEL - Abnormal; Notable for the following:    Glucose, Bld 129 (*)    Creatinine, Ser 1.24 (*)    GFR calc non Af Amer 40 (*)    GFR calc Af Amer 46 (*)    All other components within normal limits  TROPONIN I - Abnormal; Notable for the following:    Troponin I 0.09 (*)    All other components within normal limits    Imaging Review Dg Chest Port 1 View  02/23/2015  CLINICAL DATA:  Patient complains of choking on chicken. Large amount of saliva. No  actual vomiting. History of atrial fibrillation, type 2 diabetes, small cell carcinoma of lung, sick sinus syndrome. Former smoker quit in 1984. EXAM: PORTABLE CHEST 1 VIEW COMPARISON:  07/12/2011 FINDINGS: Cardiac pacemaker. Shallow inspiration. Volume loss in the right lung with opacity in the right apex probably postoperative given the history of lung cancer. No change in appearance since the previous study. Large calcified granuloma in the left upper lung with calcified lymph node in the left aortopulmonic window region. No focal airspace disease or consolidation in the lungs. Mediastinal contours are unchanged since previous study. IMPRESSION: No evidence of active pulmonary disease. No change since prior study. Right apical opacity with volume loss in the right lung likely postoperative. Large calcified granulomas. Electronically Signed   By: Lucienne Capers M.D.   On: 02/23/2015 21:53   I have personally reviewed and evaluated these images and lab results as part of my medical decision-making.   EKG Interpretation   Date/Time:  Saturday February 23 2015 21:30:09 EDT Ventricular Rate:  140 PR Interval:    QRS Duration: 141 QT Interval:  360 QTC Calculation: 549 R Axis:   -8 Text Interpretation:  Extreme tachycardia with wide complex, no further  rhythm analysis attempted Artifact in lead(s) I III aVL afib new from  prior Confirmed by Vince Ainsley   MD, Ajdin Macke ([id number]) on 02/23/2015 11:39:44 PM      MDM   Final diagnoses:  SOB (shortness of breath)    Patient given Lopressor IV push 2.5 mg 4. Heart rate has improved. Also given some IV fluids. Given glucagon and still has foreign body sensation and was given liquids which she regurgitated. Consult to GI on call was obtained and they will come to do endoscopy. Due to the patient's persistent increased heart rate patient was admitted to the medicine service.   CRITICAL CARE Performed by: Leota Jacobsen Total critical care time: 65 Critical care time was exclusive of separately billable procedures and treating other patients. Critical care was necessary to treat or prevent imminent or life-threatening deterioration. Critical care was time spent personally by me on the following activities: development of treatment plan with patient and/or surrogate as well as nursing, discussions with consultants, evaluation of patient's response to treatment, examination of patient, obtaining history from patient or surrogate, ordering and performing treatments and interventions, ordering and review of laboratory studies, ordering and review of radiographic studies, pulse oximetry and re-evaluation of patient's condition.    Lacretia Leigh, MD 02/23/15 [phone]

## 2015-02-23 NOTE — ED Notes (Signed)
X RAY at bedside 

## 2015-02-23 NOTE — ED Notes (Signed)
Pt unable to keep water down

## 2015-02-23 NOTE — ED Notes (Signed)
MD Zenia Resides at bedside speaking with family.

## 2015-02-24 ENCOUNTER — Inpatient Hospital Stay (HOSPITAL_COMMUNITY): Payer: Medicare Other

## 2015-02-24 ENCOUNTER — Encounter (HOSPITAL_COMMUNITY): Payer: Self-pay | Admitting: Gastroenterology

## 2015-02-24 DIAGNOSIS — Z950 Presence of cardiac pacemaker: Secondary | ICD-10-CM | POA: Diagnosis not present

## 2015-02-24 DIAGNOSIS — N183 Chronic kidney disease, stage 3 (moderate): Secondary | ICD-10-CM | POA: Diagnosis present

## 2015-02-24 DIAGNOSIS — I480 Paroxysmal atrial fibrillation: Secondary | ICD-10-CM | POA: Diagnosis present

## 2015-02-24 DIAGNOSIS — Z87891 Personal history of nicotine dependence: Secondary | ICD-10-CM | POA: Diagnosis not present

## 2015-02-24 DIAGNOSIS — J9601 Acute respiratory failure with hypoxia: Secondary | ICD-10-CM | POA: Diagnosis not present

## 2015-02-24 DIAGNOSIS — Z79891 Long term (current) use of opiate analgesic: Secondary | ICD-10-CM | POA: Diagnosis not present

## 2015-02-24 DIAGNOSIS — F329 Major depressive disorder, single episode, unspecified: Secondary | ICD-10-CM | POA: Diagnosis present

## 2015-02-24 DIAGNOSIS — E785 Hyperlipidemia, unspecified: Secondary | ICD-10-CM | POA: Diagnosis present

## 2015-02-24 DIAGNOSIS — Z79899 Other long term (current) drug therapy: Secondary | ICD-10-CM | POA: Diagnosis not present

## 2015-02-24 DIAGNOSIS — D631 Anemia in chronic kidney disease: Secondary | ICD-10-CM | POA: Diagnosis present

## 2015-02-24 DIAGNOSIS — K449 Diaphragmatic hernia without obstruction or gangrene: Secondary | ICD-10-CM | POA: Diagnosis present

## 2015-02-24 DIAGNOSIS — E1122 Type 2 diabetes mellitus with diabetic chronic kidney disease: Secondary | ICD-10-CM | POA: Diagnosis present

## 2015-02-24 DIAGNOSIS — Z803 Family history of malignant neoplasm of breast: Secondary | ICD-10-CM | POA: Diagnosis not present

## 2015-02-24 DIAGNOSIS — Z22322 Carrier or suspected carrier of Methicillin resistant Staphylococcus aureus: Secondary | ICD-10-CM | POA: Diagnosis not present

## 2015-02-24 DIAGNOSIS — Z923 Personal history of irradiation: Secondary | ICD-10-CM | POA: Diagnosis not present

## 2015-02-24 DIAGNOSIS — Z638 Other specified problems related to primary support group: Secondary | ICD-10-CM | POA: Diagnosis not present

## 2015-02-24 DIAGNOSIS — I2510 Atherosclerotic heart disease of native coronary artery without angina pectoris: Secondary | ICD-10-CM | POA: Diagnosis present

## 2015-02-24 DIAGNOSIS — Z9181 History of falling: Secondary | ICD-10-CM | POA: Diagnosis not present

## 2015-02-24 DIAGNOSIS — K222 Esophageal obstruction: Secondary | ICD-10-CM | POA: Diagnosis present

## 2015-02-24 DIAGNOSIS — D638 Anemia in other chronic diseases classified elsewhere: Secondary | ICD-10-CM | POA: Diagnosis not present

## 2015-02-24 DIAGNOSIS — D509 Iron deficiency anemia, unspecified: Secondary | ICD-10-CM | POA: Diagnosis present

## 2015-02-24 DIAGNOSIS — I10 Essential (primary) hypertension: Secondary | ICD-10-CM

## 2015-02-24 DIAGNOSIS — Z9221 Personal history of antineoplastic chemotherapy: Secondary | ICD-10-CM | POA: Diagnosis not present

## 2015-02-24 DIAGNOSIS — R0602 Shortness of breath: Secondary | ICD-10-CM | POA: Diagnosis not present

## 2015-02-24 DIAGNOSIS — T18108A Unspecified foreign body in esophagus causing other injury, initial encounter: Secondary | ICD-10-CM | POA: Diagnosis not present

## 2015-02-24 DIAGNOSIS — I4891 Unspecified atrial fibrillation: Secondary | ICD-10-CM | POA: Diagnosis present

## 2015-02-24 DIAGNOSIS — Z833 Family history of diabetes mellitus: Secondary | ICD-10-CM | POA: Diagnosis not present

## 2015-02-24 DIAGNOSIS — C3490 Malignant neoplasm of unspecified part of unspecified bronchus or lung: Secondary | ICD-10-CM | POA: Diagnosis present

## 2015-02-24 DIAGNOSIS — F0390 Unspecified dementia without behavioral disturbance: Secondary | ICD-10-CM | POA: Diagnosis present

## 2015-02-24 DIAGNOSIS — M1990 Unspecified osteoarthritis, unspecified site: Secondary | ICD-10-CM | POA: Diagnosis present

## 2015-02-24 DIAGNOSIS — I495 Sick sinus syndrome: Secondary | ICD-10-CM | POA: Diagnosis present

## 2015-02-24 DIAGNOSIS — I5033 Acute on chronic diastolic (congestive) heart failure: Secondary | ICD-10-CM | POA: Diagnosis present

## 2015-02-24 DIAGNOSIS — Z515 Encounter for palliative care: Secondary | ICD-10-CM | POA: Diagnosis not present

## 2015-02-24 DIAGNOSIS — I248 Other forms of acute ischemic heart disease: Secondary | ICD-10-CM | POA: Diagnosis present

## 2015-02-24 DIAGNOSIS — X58XXXA Exposure to other specified factors, initial encounter: Secondary | ICD-10-CM | POA: Diagnosis present

## 2015-02-24 DIAGNOSIS — Z66 Do not resuscitate: Secondary | ICD-10-CM | POA: Diagnosis present

## 2015-02-24 DIAGNOSIS — T18108S Unspecified foreign body in esophagus causing other injury, sequela: Secondary | ICD-10-CM | POA: Diagnosis not present

## 2015-02-24 DIAGNOSIS — T18128A Food in esophagus causing other injury, initial encounter: Secondary | ICD-10-CM | POA: Diagnosis present

## 2015-02-24 DIAGNOSIS — R1310 Dysphagia, unspecified: Secondary | ICD-10-CM | POA: Diagnosis not present

## 2015-02-24 DIAGNOSIS — F419 Anxiety disorder, unspecified: Secondary | ICD-10-CM | POA: Diagnosis present

## 2015-02-24 DIAGNOSIS — Z7982 Long term (current) use of aspirin: Secondary | ICD-10-CM | POA: Diagnosis not present

## 2015-02-24 DIAGNOSIS — J690 Pneumonitis due to inhalation of food and vomit: Secondary | ICD-10-CM | POA: Diagnosis present

## 2015-02-24 DIAGNOSIS — R7989 Other specified abnormal findings of blood chemistry: Secondary | ICD-10-CM | POA: Diagnosis present

## 2015-02-24 DIAGNOSIS — R778 Other specified abnormalities of plasma proteins: Secondary | ICD-10-CM | POA: Diagnosis present

## 2015-02-24 DIAGNOSIS — I130 Hypertensive heart and chronic kidney disease with heart failure and stage 1 through stage 4 chronic kidney disease, or unspecified chronic kidney disease: Secondary | ICD-10-CM | POA: Diagnosis present

## 2015-02-24 DIAGNOSIS — R233 Spontaneous ecchymoses: Secondary | ICD-10-CM | POA: Diagnosis present

## 2015-02-24 LAB — COMPREHENSIVE METABOLIC PANEL
ALT: 9 U/L — AB (ref 14–54)
AST: 29 U/L (ref 15–41)
Albumin: 3.1 g/dL — ABNORMAL LOW (ref 3.5–5.0)
Alkaline Phosphatase: 72 U/L (ref 38–126)
Anion gap: 9 (ref 5–15)
BILIRUBIN TOTAL: 0.9 mg/dL (ref 0.3–1.2)
BUN: 16 mg/dL (ref 6–20)
CHLORIDE: 110 mmol/L (ref 101–111)
CO2: 21 mmol/L — ABNORMAL LOW (ref 22–32)
CREATININE: 0.94 mg/dL (ref 0.44–1.00)
Calcium: 7.9 mg/dL — ABNORMAL LOW (ref 8.9–10.3)
GFR, EST NON AFRICAN AMERICAN: 55 mL/min — AB (ref >60)
Glucose, Bld: 98 mg/dL (ref 65–99)
Potassium: 4 mmol/L (ref 3.5–5.1)
Sodium: 140 mmol/L (ref 135–145)
TOTAL PROTEIN: 6.4 g/dL — AB (ref 6.5–8.1)

## 2015-02-24 LAB — CBC WITH DIFFERENTIAL/PLATELET
Basophils Absolute: 0 10*3/uL (ref 0.0–0.1)
Basophils Relative: 0 %
EOS PCT: 0 %
Eosinophils Absolute: 0 10*3/uL (ref 0.0–0.7)
HCT: 36.1 % (ref 36.0–46.0)
Hemoglobin: 11.4 g/dL — ABNORMAL LOW (ref 12.0–15.0)
LYMPHS ABS: 1 10*3/uL (ref 0.7–4.0)
LYMPHS PCT: 12 %
MCH: 29 pg (ref 26.0–34.0)
MCHC: 31.6 g/dL (ref 30.0–36.0)
MCV: 91.9 fL (ref 78.0–100.0)
MONO ABS: 0.6 10*3/uL (ref 0.1–1.0)
Monocytes Relative: 6 %
Neutro Abs: 7 10*3/uL (ref 1.7–7.7)
Neutrophils Relative %: 82 %
PLATELETS: 134 10*3/uL — AB (ref 150–400)
RBC: 3.93 MIL/uL (ref 3.87–5.11)
RDW: 14.1 % (ref 11.5–15.5)
WBC: 8.7 10*3/uL (ref 4.0–10.5)

## 2015-02-24 LAB — BASIC METABOLIC PANEL
ANION GAP: 13 (ref 5–15)
BUN: 15 mg/dL (ref 6–20)
CO2: 15 mmol/L — AB (ref 22–32)
Calcium: 8.1 mg/dL — ABNORMAL LOW (ref 8.9–10.3)
Chloride: 111 mmol/L (ref 101–111)
Creatinine, Ser: 1.04 mg/dL — ABNORMAL HIGH (ref 0.44–1.00)
GFR, EST AFRICAN AMERICAN: 57 mL/min — AB (ref >60)
GFR, EST NON AFRICAN AMERICAN: 49 mL/min — AB (ref >60)
GLUCOSE: 212 mg/dL — AB (ref 65–99)
POTASSIUM: 4.4 mmol/L (ref 3.5–5.1)
Sodium: 139 mmol/L (ref 135–145)

## 2015-02-24 LAB — CBC
HEMATOCRIT: 39.6 % (ref 36.0–46.0)
Hemoglobin: 12.7 g/dL (ref 12.0–15.0)
MCH: 29.1 pg (ref 26.0–34.0)
MCHC: 32.1 g/dL (ref 30.0–36.0)
MCV: 90.6 fL (ref 78.0–100.0)
Platelets: 221 10*3/uL (ref 150–400)
RBC: 4.37 MIL/uL (ref 3.87–5.11)
RDW: 14.1 % (ref 11.5–15.5)
WBC: 17.8 10*3/uL — AB (ref 4.0–10.5)

## 2015-02-24 LAB — MAGNESIUM
MAGNESIUM: 1.3 mg/dL — AB (ref 1.7–2.4)
MAGNESIUM: 1.7 mg/dL (ref 1.7–2.4)

## 2015-02-24 LAB — TROPONIN I
TROPONIN I: 0.87 ng/mL — AB (ref <0.031)
Troponin I: 0.16 ng/mL — ABNORMAL HIGH (ref <0.031)
Troponin I: 0.48 ng/mL — ABNORMAL HIGH (ref <0.031)
Troponin I: 0.66 ng/mL (ref <0.031)

## 2015-02-24 LAB — TSH: TSH: 2.574 u[IU]/mL (ref 0.350–4.500)

## 2015-02-24 LAB — D-DIMER, QUANTITATIVE (NOT AT ARMC): D DIMER QUANT: 0.91 ug{FEU}/mL — AB (ref 0.00–0.48)

## 2015-02-24 LAB — MRSA PCR SCREENING: MRSA BY PCR: POSITIVE — AB

## 2015-02-24 MED ORDER — FUROSEMIDE 10 MG/ML IJ SOLN
20.0000 mg | Freq: Once | INTRAMUSCULAR | Status: AC
  Administered 2015-02-24: 20 mg via INTRAVENOUS
  Filled 2015-02-24: qty 2

## 2015-02-24 MED ORDER — DILTIAZEM HCL 100 MG IV SOLR
5.0000 mg/h | INTRAVENOUS | Status: DC
  Administered 2015-02-24: 5 mg/h via INTRAVENOUS
  Filled 2015-02-24: qty 100

## 2015-02-24 MED ORDER — FENTANYL CITRATE (PF) 100 MCG/2ML IJ SOLN
INTRAMUSCULAR | Status: DC | PRN
  Administered 2015-02-24 (×2): 12.5 ug via INTRAVENOUS

## 2015-02-24 MED ORDER — MUPIROCIN 2 % EX OINT
1.0000 "application " | TOPICAL_OINTMENT | Freq: Two times a day (BID) | CUTANEOUS | Status: DC
  Administered 2015-02-24 – 2015-02-28 (×9): 1 via NASAL
  Filled 2015-02-24 (×2): qty 22

## 2015-02-24 MED ORDER — ASPIRIN 300 MG RE SUPP
300.0000 mg | Freq: Once | RECTAL | Status: AC
  Administered 2015-02-24: 300 mg via RECTAL
  Filled 2015-02-24: qty 1

## 2015-02-24 MED ORDER — CETYLPYRIDINIUM CHLORIDE 0.05 % MT LIQD
7.0000 mL | Freq: Two times a day (BID) | OROMUCOSAL | Status: DC
  Administered 2015-02-24 – 2015-02-28 (×9): 7 mL via OROMUCOSAL

## 2015-02-24 MED ORDER — FUROSEMIDE 10 MG/ML IJ SOLN
INTRAMUSCULAR | Status: AC
  Filled 2015-02-24: qty 4

## 2015-02-24 MED ORDER — FUROSEMIDE 10 MG/ML IJ SOLN
40.0000 mg | Freq: Once | INTRAMUSCULAR | Status: AC
  Administered 2015-02-24: 40 mg via INTRAVENOUS

## 2015-02-24 MED ORDER — SODIUM CHLORIDE 0.9 % IV SOLN
INTRAVENOUS | Status: DC
  Administered 2015-02-24 – 2015-02-25 (×4): via INTRAVENOUS

## 2015-02-24 MED ORDER — MAGNESIUM SULFATE 2 GM/50ML IV SOLN
2.0000 g | Freq: Once | INTRAVENOUS | Status: AC
  Administered 2015-02-24: 2 g via INTRAVENOUS
  Filled 2015-02-24: qty 50

## 2015-02-24 MED ORDER — SODIUM CHLORIDE 0.9 % IV BOLUS (SEPSIS)
1000.0000 mL | Freq: Once | INTRAVENOUS | Status: AC
  Administered 2015-02-24: 1000 mL via INTRAVENOUS

## 2015-02-24 MED ORDER — CHLORHEXIDINE GLUCONATE CLOTH 2 % EX PADS
6.0000 | MEDICATED_PAD | Freq: Every day | CUTANEOUS | Status: DC
  Administered 2015-02-24 – 2015-02-27 (×4): 6 via TOPICAL

## 2015-02-24 MED ORDER — PANTOPRAZOLE SODIUM 40 MG IV SOLR
40.0000 mg | Freq: Two times a day (BID) | INTRAVENOUS | Status: DC
  Administered 2015-02-24 – 2015-02-27 (×8): 40 mg via INTRAVENOUS
  Filled 2015-02-24 (×9): qty 40

## 2015-02-24 MED ORDER — METOPROLOL TARTRATE 1 MG/ML IV SOLN
2.5000 mg | Freq: Once | INTRAVENOUS | Status: AC
  Administered 2015-02-24: 2.5 mg via INTRAVENOUS
  Filled 2015-02-24: qty 5

## 2015-02-24 MED ORDER — MIDAZOLAM HCL 10 MG/2ML IJ SOLN
INTRAMUSCULAR | Status: DC | PRN
  Administered 2015-02-24 (×3): .5 mg via INTRAVENOUS

## 2015-02-24 MED ORDER — SODIUM CHLORIDE 0.9 % IJ SOLN
3.0000 mL | Freq: Two times a day (BID) | INTRAMUSCULAR | Status: DC
  Administered 2015-02-24 – 2015-02-28 (×8): 3 mL via INTRAVENOUS

## 2015-02-24 MED ORDER — ACETAMINOPHEN 325 MG PO TABS
650.0000 mg | ORAL_TABLET | Freq: Four times a day (QID) | ORAL | Status: DC | PRN
  Administered 2015-02-26 – 2015-02-27 (×2): 650 mg via ORAL
  Filled 2015-02-24 (×2): qty 2

## 2015-02-24 MED ORDER — HEPARIN SODIUM (PORCINE) 5000 UNIT/ML IJ SOLN
5000.0000 [IU] | Freq: Three times a day (TID) | INTRAMUSCULAR | Status: DC
  Administered 2015-02-24 – 2015-02-26 (×7): 5000 [IU] via SUBCUTANEOUS
  Filled 2015-02-24 (×9): qty 1

## 2015-02-24 MED ORDER — METOPROLOL TARTRATE 1 MG/ML IV SOLN
5.0000 mg | Freq: Once | INTRAVENOUS | Status: AC
  Administered 2015-02-24: 5 mg via INTRAVENOUS

## 2015-02-24 MED ORDER — ASPIRIN EC 81 MG PO TBEC
81.0000 mg | DELAYED_RELEASE_TABLET | Freq: Every day | ORAL | Status: DC
  Administered 2015-02-25 – 2015-02-28 (×4): 81 mg via ORAL
  Filled 2015-02-24 (×5): qty 1

## 2015-02-24 MED ORDER — ACETAMINOPHEN 650 MG RE SUPP: 650.0000 mg | Freq: Four times a day (QID) | RECTAL | Status: DC | PRN

## 2015-02-24 MED ORDER — ONDANSETRON HCL 4 MG/2ML IJ SOLN
4.0000 mg | Freq: Four times a day (QID) | INTRAMUSCULAR | Status: DC | PRN
  Administered 2015-02-24 – 2015-02-27 (×2): 4 mg via INTRAVENOUS
  Filled 2015-02-24 (×2): qty 2

## 2015-02-24 MED ORDER — MORPHINE SULFATE (PF) 2 MG/ML IV SOLN
2.0000 mg | Freq: Once | INTRAVENOUS | Status: AC
  Administered 2015-02-24: 2 mg via INTRAVENOUS
  Filled 2015-02-24: qty 1

## 2015-02-24 MED ORDER — DILTIAZEM LOAD VIA INFUSION
10.0000 mg | Freq: Once | INTRAVENOUS | Status: AC
  Administered 2015-02-24: 10 mg via INTRAVENOUS
  Filled 2015-02-24: qty 10

## 2015-02-24 MED ORDER — METOPROLOL TARTRATE 1 MG/ML IV SOLN
2.5000 mg | Freq: Four times a day (QID) | INTRAVENOUS | Status: DC
  Administered 2015-02-24 – 2015-02-27 (×12): 2.5 mg via INTRAVENOUS
  Filled 2015-02-24 (×13): qty 5

## 2015-02-24 MED ORDER — ONDANSETRON HCL 4 MG PO TABS: 4.0000 mg | ORAL_TABLET | Freq: Four times a day (QID) | ORAL | Status: DC | PRN

## 2015-02-24 NOTE — Progress Notes (Signed)
CRITICAL VALUE ALERT  Critical value received:  Troponin 0.87  Date of notification: 02/24/2015  Time of notification:  1317  Critical value read back:Yes.    Nurse who received alert: Dorrene German, RN  MD notified (1st page):  Leisa Lenz  Time of first page:  74  MD notified (2nd page):  Time of second page:  Responding MD:  Leisa Lenz  Time MD responded:  1325

## 2015-02-24 NOTE — ED Notes (Signed)
Bed: RESA Expected date:  Expected time:  Means of arrival:  Comments: [place] (Endo in progress)

## 2015-02-24 NOTE — ED Notes (Signed)
Hospitalist at bedside 

## 2015-02-24 NOTE — ED Notes (Signed)
Endo completed. Pt resting. Pt tachycardic. Will continue to monitor.

## 2015-02-24 NOTE — Op Note (Signed)
Upper endoscopy report  Patient Name:[name], [name] DOB: [birth year]  ENDOSCOPIST: Harl Bowie, MD REFERRED BY: Dr Zenia Resides (ER physician) PROCEDURE DATE:  02/23/2015 PROCEDURE:  EGD, foreign body esophagus ASA CLASS:     Class III INDICATIONS:  Food impaction in the esophagus MEDICATIONS: Fentanyl 37.5 mcg IV and Versed 1.5 mg IV TOPICAL ANESTHETIC: none  DESCRIPTION OF PROCEDURE: After the risks benefits and alternatives of the procedure were thoroughly explained, informed consent was obtained.  The Pentax [id number], [id number] endoscope was introduced through the mouth and advanced to the second portion of the duodenum , Without limitations.  The instrument was slowly withdrawn as the mucosa was fully examined.  Esophagus was filled with shredded chicken with large chunk of meat in the proximal esophagus near UES at 20 cm from incisors, removed with roth net and rest of the chicken in esophagus was pushed down carefully into the stomach. Likely peptic stricture in distal esophagus at 32 cm, was easily traversed with no resistance. Large hiatal hernia. Noted petechial hemorrhage in gastric antral mucosa. Duodenum appeared normal  COMPLICATIONS: There were no immediate complications.  Endoscopic Impression: Food impaction in esophagus at UES s/p disimpaction Distal esophageal likely peptic stricture Hiatal hernia  Recommendations: Omeprazole 20 mg daily Soft pureed diet, ground meat, avoid high residue diet  Damaris Hippo , MD [phone] Mon-Fri 8a-5p [phone] after 5p, weekends, holidays

## 2015-02-24 NOTE — Progress Notes (Signed)
Troponin level up to 0.87. Will obtain another troponin in 6 hours. Obtain 2 D ECHO. Obtain repeat 12 lead EKG.  Leisa Lenz [facility] [phone]

## 2015-02-24 NOTE — Progress Notes (Signed)
While initiating Cardizem bolus of '10mg'$ , pt heart rate quickly dropped from 140 to 90 in less than 2 min.  BP stable, but bolus stopped.  Initiated Cardizem continuous drip at 2 mg/hour and will increase as pt tolerates.  Currently HR 102. BP 123/59.  Fredirick Maudlin NP aware of pt status. Orders received.

## 2015-02-24 NOTE — H&P (Signed)
[facility] History and Physical  Patient: [name]  MRN: [id number]  DOB: 07-14-1931  DOS: the patient was seen and examined on 02/24/2015 PCP: Reymundo Poll, MD  Referring physician: Dr. Zenia Resides Chief Complaint: choking on chicken  HPI: [name] is a 81 y.o. female with Past medical history of atrial fibrillation, not on any anticoagulation due to recurrent fall, diabetes mellitus type 2, coronary artery disease, mood disorder, pacemaker implant, chronic anemia, lung cancer. The patient is presenting with complaints of choking episode while eating supper. The patient is from [facility] facility. While she was eating her supper suddenly she had a choking episode and she couldn't swallow her saliva and was having a sense of something stuck in her throat. She was in significant distress and therefore they called the EMS. Patient was found to be tachycardic initially. Patient was evaluated in the ED and was discussed with gastroenterology who performed an endoscopy in the ER and removed large chunk of meat in the proximal esophagus. During this process the patient was also found to be having atrial fibrillation with rapid ventricular rate not improving with IV Lopressor with hypertension and therefore hospitalist was consulted for admission for the patient. Patient was evaluated after the endoscopy and therefore was instructed sedation and therefore the history was taken from ED documentation as well as the family at bedside. At her baseline the patient is slightly communicative and it does participate in ADLs  The patient is coming from SNF.  At her baseline ambulates with wheelchair due to recurrent fall And is somewhat dependent for her ADL; does not manages her medication on her own.  Review of Systems: as mentioned in the history of present illness.  A comprehensive review of the other systems is negative.  Past Medical History  Diagnosis Date  . Pacemaker       MDT-DDD  . Sick sinus syndrome (HCC)     tachy-brady  . Atrial fibrillation ([place])   . Arthritis   . CAD (coronary artery disease)   . DM (diabetes mellitus) ([place])     type II  . Diverticulitis   . Depression   . Iron deficiency anemia, unspecified 12/30/2012  . Small cell carcinoma of lung ([place]) 12/30/2012  . Anemia of chronic renal failure, stage 3 (moderate) 06/15/2014   Past Surgical History  Procedure Laterality Date  . Kidney tumor    . Lung cancer surgery    . Breast biopsy    . Pacemaker insertion  ~  . Cholecystectomy     Social History:  reports that she quit smoking about 32 years ago. Her smoking use included Cigarettes. She started smoking about 70 years ago. She has a 120 pack-year smoking history. She has never used smokeless tobacco. She reports that she does not drink alcohol or use illicit drugs.  No Known Allergies  Family History  Problem Relation Age of Onset  . Diabetes    . Breast cancer    . Alcohol abuse      ADDICTION    Prior to Admission medications   Medication Sig Start Date End Date Taking? Authorizing Provider  acetaminophen (TYLENOL) 325 MG tablet Take 2 tablets (650 mg total) by mouth every 6 (six) hours as needed for mild pain, moderate pain, fever or headache. 01/21/15  Yes Modena Jansky, MD  Albuterol Sulfate 108 (90 BASE) MCG/ACT AEPB Inhale 1 puff into the lungs 4 (four) times daily.   Yes Historical Provider, MD  aspirin  EC 81 MG tablet Take 81 mg by mouth daily.   Yes Historical Provider, MD  atorvastatin (LIPITOR) 10 MG tablet Take 5 mg by mouth daily.  09/30/12  Yes Historical Provider, MD  Calcium Citrate-Vitamin D (CITRACAL + D PO) Take 2 tablets by mouth every morning.    Yes Historical Provider, MD  carvedilol (COREG) 6.25 MG tablet Take 6.25 mg by mouth 2 (two) times daily with a meal.   Yes Historical Provider, MD  citalopram (CELEXA) 20 MG tablet Take 20 mg by mouth at bedtime.    Yes Historical Provider, MD  donepezil  (ARICEPT) 5 MG tablet Take 5 mg by mouth at bedtime.   Yes Historical Provider, MD  Estradiol 10 MCG TABS Place 1 tablet vaginally once a week. GIVEN ON WEDNESDAYS   Yes Historical Provider, MD  fentaNYL (DURAGESIC - DOSED MCG/HR) 25 MCG/HR patch Place 25 mcg onto the skin every 3 (three) days.   Yes Historical Provider, MD  niacin (NIASPAN) 500 MG CR tablet Take 500 mg by mouth at bedtime.   Yes Historical Provider, MD  oxybutynin (DITROPAN) 5 MG tablet Take 5 mg by mouth daily.    Yes Historical Provider, MD  oxyCODONE-acetaminophen (PERCOCET/ROXICET) 5-325 MG per tablet Take 1 tablet by mouth every 8 (eight) hours as needed for severe pain. 01/21/15  Yes Modena Jansky, MD  sennosides-docusate sodium (SENOKOT-S) 8.6-50 MG tablet Take 2 tablets by mouth at bedtime.   Yes Historical Provider, MD  traZODone (DESYREL) 50 MG tablet Take 100 mg by mouth at bedtime.    Yes Historical Provider, MD  vitamin B-12 (CYANOCOBALAMIN) 1000 MCG tablet Take 1,000 mcg by mouth daily.   Yes Historical Provider, MD  carvedilol (COREG) 12.5 MG tablet Take 1 tablet (12.5 mg total) by mouth 2 (two) times daily with a meal. Patient not taking: Reported on 02/23/2015 11/01/13   Volanda Napoleon, MD  menthol-cetylpyridinium (CEPACOL) 3 MG lozenge Take 1 lozenge by mouth as needed for sore throat.    Historical Provider, MD    Physical Exam: Filed Vitals:   02/24/15 0345 02/24/15 0350 02/24/15 0355 02/24/15 0400  BP: 123/59 118/59 120/49 103/40  Pulse: 83 94 65 64  Temp:      TempSrc:      Resp: '25 18 25 19  '$ SpO2: 99% 99% 93% 100%    General: Alert, Awake and Oriented to self, pleasantly confused. Appear in mild distress Eyes: PERRL ENT: Oral Mucosa clear moist. Neck: no JVD Cardiovascular: S1 and S2 Present, no Murmur, Peripheral Pulses Present Respiratory: Bilateral Air entry equal and Decreased,  Clear to Auscultation, no Crackles, no wheezes Abdomen: Bowel Sound present, Soft and no tenderness Skin: no  Rash Extremities: no Pedal edema, no calf tenderness Neurologic: on initial evaluation the patient was under the effect of sedation. At reevaluation Mental status speech normal, attention normal,  Cranial Nerves PERRL, EOM normal and present, tongue midline Mild facial droop noted on the right which corrects change in position of the patient. Motor strength bilateral equal strength 5/5,  Sensation present to light touch,  Reflexes difficult to elicit knee and biceps, babinski equivocal,  Cerebellar test normal finger nose finger.  Labs on Admission:  CBC:  Recent Labs Lab 02/23/15 2144  WBC 10.7*  NEUTROABS 7.7  HGB 13.9  HCT 41.9  MCV 88.0  PLT 215    CMP     Component Value Date/Time   NA 139 02/23/2015 2144   NA 143 06/15/2014 1211  K 4.1 02/23/2015 2144   K 4.0 06/15/2014 1211   CL 105 02/23/2015 2144   CL 102 06/15/2014 1211   CO2 25 02/23/2015 2144   CO2 28 06/15/2014 1211   GLUCOSE 129* 02/23/2015 2144   GLUCOSE 82 06/15/2014 1211   BUN 19 02/23/2015 2144   BUN 12 06/15/2014 1211   CREATININE 1.24* 02/23/2015 2144   CREATININE 1.2 06/15/2014 1211   CALCIUM 9.5 02/23/2015 2144   CALCIUM 9.2 06/15/2014 1211   PROT 6.9 06/15/2014 1211   PROT 6.9 10/30/2013 0455   ALBUMIN 3.5 06/15/2014 1211   ALBUMIN 3.4* 10/30/2013 0455   AST 17 06/15/2014 1211   AST 16 10/30/2013 0455   ALT 9* 06/15/2014 1211   ALT 7 10/30/2013 0455   ALKPHOS 45 06/15/2014 1211   ALKPHOS 51 10/30/2013 0455   BILITOT 0.50 06/15/2014 1211   BILITOT <0.2* 10/30/2013 0455   GFRNONAA 40* 02/23/2015 2144   GFRAA 46* 02/23/2015 2144     Recent Labs Lab 02/23/15 2144 02/24/15 0138  TROPONINI 0.09* 0.16*   BNP (last 3 results) No results for input(s): BNP in the last 8760 hours.  ProBNP (last 3 results) No results for input(s): PROBNP in the last 8760 hours.   Radiological Exams on Admission: Ct Head Wo Contrast  02/24/2015  CLINICAL DATA:  Food impaction in the esophagus  with endoscopy earlier. Probable peptic stricture in the distal esophagus. Hiatal hernia. Change in condition during medication application. EXAM: CT HEAD WITHOUT CONTRAST TECHNIQUE: Contiguous axial images were obtained from the base of the skull through the vertex without intravenous contrast. COMPARISON:  10/30/2013 FINDINGS: Diffuse cerebral atrophy. Ventricular dilatation likely representing central atrophy. Diffuse low attenuation change throughout the deep white matter consistent with small vessel ischemia. No change in appearance since previous studies. No mass effect or midline shift. No abnormal extra-axial fluid collections. Gray-white matter junctions are distinct. Basal cisterns are not effaced. No evidence of acute intracranial hemorrhage. No depressed skull fractures. Visualized paranasal sinuses and mastoid air cells are not opacified. Vascular calcifications. IMPRESSION: No acute intracranial abnormalities. Chronic atrophy and small vessel ischemic changes. Electronically Signed   By: Lucienne Capers M.D.   On: 02/24/2015 05:09   Dg Chest Port 1 View  02/23/2015  CLINICAL DATA:  Patient complains of choking on chicken. Large amount of saliva. No actual vomiting. History of atrial fibrillation, type 2 diabetes, small cell carcinoma of lung, sick sinus syndrome. Former smoker quit in 1984. EXAM: PORTABLE CHEST 1 VIEW COMPARISON:  07/12/2011 FINDINGS: Cardiac pacemaker. Shallow inspiration. Volume loss in the right lung with opacity in the right apex probably postoperative given the history of lung cancer. No change in appearance since the previous study. Large calcified granuloma in the left upper lung with calcified lymph node in the left aortopulmonic window region. No focal airspace disease or consolidation in the lungs. Mediastinal contours are unchanged since previous study. IMPRESSION: No evidence of active pulmonary disease. No change since prior study. Right apical opacity with volume  loss in the right lung likely postoperative. Large calcified granulomas. Electronically Signed   By: Lucienne Capers M.D.   On: 02/23/2015 21:53   EKG: Independently reviewed. atrial fibrillation, rate RVR.  Assessment/Plan 1. Atrial fibrillation with RVR ([place]) Patient presents with choking while eating. Patient was seen after endoscopy for foreign body removal. Patient currently remains in atrial fibrillation with rapid ventricular response. She is hypotensive with a blood pressure in 70s. At present I will give her 1 L  normal saline bolus. We will reevaluate her in 2 hours. The patient continues to remain tachycardic with put her on Cardizem drip. Currently holding her beta blockers in the setting of use of Cardizem. We give her aspirin suppository. Follow serial troponin. Not a candidate for anticoagulation due to recurrent fall.  2.Essential hypertension, benign Blood pressure at present borderline. Currently holding blood pressure medications.  3 Esophageal foreign body Dysphagia. Status post endoscopy with foreign body removal. Patient will be with her by speech therapy in the morning and if that remains nothing by mouth.  4  Elevated troponin We will follow serial troponin and echocardiogram in the morning. Patient currently nothing by mouth. Most likely this is to moderate ischemia secondary to her tachycardia.  5  Dysphagia This is most likely consistent with poor mastication. Although possibility of CVA cannot be ruled out but this appears highly unlikely given normal examination otherwise other than confusion. Regardless the patient has been given aspirin suppository. CT scan is unremarkable. Serial neuro checks.  Nutrition: nothing by mouth DVT Prophylaxis: subcutaneous Heparin  Advance goals of care discussion: DNR/DNI as per my discussion with patient's family   Consults: gastroenterology appreciate input  Family Communication: family was present at  bedside, opportunity was given to ask question and all questions were answered satisfactorily at the time of interview. Disposition: Admitted as inpatient, step-down unit.  Author: Berle Mull, MD [facility] Pager: [phone] 02/24/2015  If 7PM-7AM, please contact night-coverage [email or web address] Password [email or web address]

## 2015-02-24 NOTE — Progress Notes (Addendum)
Patient ID: [name], female   DOB: August 04, 1931, 81 y.o.   MRN: [id number] [facility] PROGRESS NOTE  [name] WLN:[id number] DOB: 1932/03/30 DOA: 02/23/2015 PCP: Reymundo Poll, MD  Brief narrative:    Pt admitted after midnight, please refer to admission note done today 02/24/2015.  81 y.o. female with past medical history of atrial fibrillation, not on any anticoagulation due to recurrent fall, hypertension, dementia, depression, CAD s/p pacemaker who presented to [facility] ED from [facility] place with reports of choking sensation while eating supper. Bedside endoscopy done in ED by GI with findings of food impaction in esophagus at UES s/p disimpaction.  Pt also found to be a fib with RVR and started on Cardizem drip.  Assessment/Plan:    Principal Problem: Esophageal foreign body - S/P EGD with findings of food impaction in esophagus at UES s/p disimpaction - SLP evaluation pending  Active Problems: Atrial fibrillation with RVR (HCC) - CHADS vasc score at least 3 (age, htn, vascular) - Rate initially controlled with Cardizem drip; changed to metoprolol 2.5 mg IV every 6 hours - Monitor on telemetry - Transfer to telemetry today    DVT Prophylaxis  - Heparin subQ   Code Status: DNR/DNI Family Communication:  Family not at the bedside this am Disposition Plan: transfer to telemetry today.   IV access:  Peripheral IV  Procedures and diagnostic studies:    Ct Head Wo Contrast 02/24/2015 No acute intracranial abnormalities. Chronic atrophy and small vessel ischemic changes. Electronically Signed   By: Lucienne Capers M.D.   On: 02/24/2015 05:09   Dg Chest Port 1 View 02/23/2015   No evidence of active pulmonary disease. No change since prior study. Right apical opacity with volume loss in the right lung likely postoperative. Large calcified granulomas. Electronically Signed   By: Lucienne Capers M.D.   On: 02/23/2015 21:53   Medical Consultants:  None   Other  Consultants:  PT eval Nutrition SLP  IAnti-Infectives:   None    Leisa Lenz, MD  [facility] Pager [phone]  Time spent in minutes: 25 minutes  If 7PM-7AM, please contact night-coverage [email or web address] Password [email or web address] 02/24/2015, 8:04 AM   LOS: 0 days    HPI/Subjective: No acute overnight events.  Objective: Filed Vitals:   02/24/15 0345 02/24/15 0350 02/24/15 0355 02/24/15 0400  BP: 123/59 118/59 120/49 103/40  Pulse: 83 94 65 64  Temp:      TempSrc:      Resp: '25 18 25 19  '$ SpO2: 99% 99% 93% 100%    Intake/Output Summary (Last 24 hours) at 02/24/15 0804 Last data filed at 02/24/15 0322  Gross per 24 hour  Intake      0 ml  Output      0 ml  Net      0 ml    Exam:   General:  Pt is alert, not in acute distress  Cardiovascular: tachycardic, (+) S1, S2   Respiratory: Clear to auscultation bilaterally, no wheezing, no crackles, no rhonchi  Abdomen: Soft, non tender, non distended, bowel sounds present  Extremities: No edema, pulses DP and PT palpable bilaterally  Neuro: Grossly nonfocal  Data Reviewed: Basic Metabolic Panel:  Recent Labs Lab 02/23/15 2144 02/24/15 0138  NA 139  --   K 4.1  --   CL 105  --   CO2 25  --   GLUCOSE 129*  --   BUN 19  --   CREATININE 1.24*  --   CALCIUM  9.5  --   MG  --  1.3*   Liver Function Tests: No results for input(s): AST, ALT, ALKPHOS, BILITOT, PROT, ALBUMIN in the last 168 hours. No results for input(s): LIPASE, AMYLASE in the last 168 hours. No results for input(s): AMMONIA in the last 168 hours. CBC:  Recent Labs Lab 02/23/15 2144 02/24/15 0714  WBC 10.7* 8.7  NEUTROABS 7.7 7.0  HGB 13.9 11.4*  HCT 41.9 36.1  MCV 88.0 91.9  PLT 215 134*   Cardiac Enzymes:  Recent Labs Lab 02/23/15 2144 02/24/15 0138  TROPONINI 0.09* 0.16*   BNP: Invalid input(s): POCBNP CBG: No results for input(s): GLUCAP in the last 168 hours.  Recent Results (from the past 240 hour(s))  MRSA PCR  Screening     Status: Abnormal   Collection Time: 02/24/15  2:39 AM  Result Value Ref Range Status   MRSA by PCR POSITIVE (A) NEGATIVE Final    Comment:        The GeneXpert MRSA Assay (FDA approved for NASAL specimens only), is one component of a comprehensive MRSA colonization surveillance program. It is not intended to diagnose MRSA infection nor to guide or monitor treatment for MRSA infections. RESULT CALLED TO, READ BACK BY AND VERIFIED WITH: DENNY,C/2W '@0546'$  ON 02/24/15 BY KARCZEWSKI,S.      Scheduled Meds: . aspirin EC  81 mg Oral Daily  . Chlorhexidine Gluconate Cloth  6 each Topical Daily  . heparin  5,000 Units Subcutaneous 3 times per day  . mupirocin ointment  1 application Nasal BID  . pantoprazole (PROTONIX) IV  40 mg Intravenous Q12H  . sodium chloride  3 mL Intravenous Q12H  . sterile water (preservative free)       Continuous Infusions: . sodium chloride 100 mL/hr at 02/24/15 0240

## 2015-02-24 NOTE — Evaluation (Signed)
Clinical/Bedside Swallow Evaluation Patient Details  Name: [name] MRN: [id number] Date of Birth: 1931/08/30  Today's Date: 02/24/2015 Time: SLP Start Time (ACUTE ONLY): 1149 SLP Stop Time (ACUTE ONLY): 7371 SLP Time Calculation (min) (ACUTE ONLY): 25 min  Past Medical History:  Past Medical History  Diagnosis Date  . Pacemaker     MDT-DDD  . Sick sinus syndrome (HCC)     tachy-brady  . Atrial fibrillation ([place])   . Arthritis   . CAD (coronary artery disease)   . DM (diabetes mellitus) ([place])     type II  . Diverticulitis   . Depression   . Iron deficiency anemia, unspecified 12/30/2012  . Small cell carcinoma of lung ([place]) 12/30/2012  . Anemia of chronic renal failure, stage 3 (moderate) 06/15/2014   Past Surgical History:  Past Surgical History  Procedure Laterality Date  . Kidney tumor    . Lung cancer surgery    . Breast biopsy    . Pacemaker insertion  ~  . Cholecystectomy     HPI:  [name] is a 81 y.o. female with Past medical history of atrial fibrillation, not on any anticoagulation due to recurrent fall, diabetes mellitus type 2, coronary artery disease, mood disorder, pacemaker implant, chronic anemia, lung cancer.The patient is presenting with complaints of choking episode while eating supper.   Assessment / Plan / Recommendation Clinical Impression  Pt exhibits mild to moderate oral dysphagia with recent history of esophageal food impaction requiring endoscopy for disimpaction at UES. Pt with impaired mastication of solids and decreased bolus cohesion. Despite oral deficits pt appeared to be protecting airway well. No overt signs or symptoms of aspiration with any PO. Given history of poor tolerance of solids, recommend conservative dysphagia 1 (puree) and thin liquid diet. Medicines crushed with puree. Pt with decreased mentation and decreased respiratory status, further increasing aspiration risk. Full supervision and feeding assistance warranted. ST to  follow up for diet tolerance.     Aspiration Risk  Mild    Diet Recommendation Dysphagia 1 (Puree);Thin   Medication Administration: Crushed with puree Compensations: Slow rate;Small sips/bites;Multiple dry swallows after each bite/sip;Minimize environmental distractions    Other  Recommendations Oral Care Recommendations: Oral care BID   Follow Up Recommendations       Frequency and Duration min 2x/week  1 week   Pertinent Vitals/Pain     SLP Swallow Goals     Swallow Study Prior Functional Status       General Date of Onset: 02/23/15 Other Pertinent Information: [name] is a 81 y.o. female with Past medical history of atrial fibrillation, not on any anticoagulation due to recurrent fall, diabetes mellitus type 2, coronary artery disease, mood disorder, pacemaker implant, chronic anemia, lung cancer.The patient is presenting with complaints of choking episode while eating supper. Type of Study: Bedside swallow evaluation Diet Prior to this Study: NPO Temperature Spikes Noted: No Respiratory Status: Supplemental O2 delivered via (comment) History of Recent Intubation: No Behavior/Cognition: Confused;Alert Oral Cavity - Dentition: Missing dentition;Poor condition Self-Feeding Abilities: Needs assist Patient Positioning: Upright in bed Baseline Vocal Quality: Normal Volitional Cough: Weak Volitional Swallow: Able to elicit    Oral/Motor/Sensory Function Overall Oral Motor/Sensory Function: Appears within functional limits for tasks assessed   Ice Chips Ice chips: Impaired Presentation: Spoon Oral Phase Impairments: Impaired mastication;Reduced lingual movement/coordination Oral Phase Functional Implications: Prolonged oral transit Pharyngeal Phase Impairments: Suspected delayed Swallow   Thin Liquid Thin Liquid: Within functional limits  Nectar Thick Nectar Thick Liquid: Not tested   Honey Thick Honey Thick Liquid: Not tested   Puree Puree: Within  functional limits   Solid   GO    Solid: Impaired Oral Phase Impairments: Impaired mastication;Reduced lingual movement/coordination Oral Phase Functional Implications: Oral residue Pharyngeal Phase Impairments: Suspected delayed Swallow;Multiple swallows      Arvil Chaco MA, CCC-SLP Acute Care Speech Language Pathologist    Arvil Chaco E 02/24/2015,12:28 PM

## 2015-02-24 NOTE — Consult Note (Signed)
Consultation  Referring Provider:     Dr Zenia Resides Primary Care Physician:  Reymundo Poll, MD Primary Gastroenterologist:   Althia Forts      Reason for Consultation:     Food impaction            HPI:   [name] is a 81 y.o. female  Was brought in with sensation of something stuck in her throat, she cannot swallow anything, spitting saliva. She was given glucagon with no improvement. Noted rapid afib treated with bblocker in ER  Past Medical History  Diagnosis Date  . Pacemaker     MDT-DDD  . Sick sinus syndrome (HCC)     tachy-brady  . Atrial fibrillation ([place])   . Arthritis   . CAD (coronary artery disease)   . DM (diabetes mellitus) ([place])     type II  . Diverticulitis   . Depression   . Iron deficiency anemia, unspecified 12/30/2012  . Small cell carcinoma of lung ([place]) 12/30/2012  . Anemia of chronic renal failure, stage 3 (moderate) 06/15/2014    Past Surgical History  Procedure Laterality Date  . Kidney tumor    . Lung cancer surgery    . Breast biopsy    . Pacemaker insertion  ~  . Cholecystectomy      Family History  Problem Relation Age of Onset  . Diabetes    . Breast cancer    . Alcohol abuse      ADDICTION     Social History  Substance Use Topics  . Smoking status: Former Smoker -- 3.00 packs/day for 40 years    Types: Cigarettes    Start date: 09/29/1944    Quit date: 08/30/1982  . Smokeless tobacco: Never Used     Comment: quit 33 years ago  . Alcohol Use: No    Prior to Admission medications   Medication Sig Start Date End Date Taking? Authorizing Provider  acetaminophen (TYLENOL) 325 MG tablet Take 2 tablets (650 mg total) by mouth every 6 (six) hours as needed for mild pain, moderate pain, fever or headache. 01/21/15  Yes Modena Jansky, MD  Albuterol Sulfate 108 (90 BASE) MCG/ACT AEPB Inhale 1 puff into the lungs 4 (four) times daily.   Yes Historical Provider, MD  aspirin EC 81 MG tablet Take 81 mg by mouth daily.   Yes Historical  Provider, MD  atorvastatin (LIPITOR) 10 MG tablet Take 5 mg by mouth daily.  09/30/12  Yes Historical Provider, MD  Calcium Citrate-Vitamin D (CITRACAL + D PO) Take 2 tablets by mouth every morning.    Yes Historical Provider, MD  carvedilol (COREG) 6.25 MG tablet Take 6.25 mg by mouth 2 (two) times daily with a meal.   Yes Historical Provider, MD  citalopram (CELEXA) 20 MG tablet Take 20 mg by mouth at bedtime.    Yes Historical Provider, MD  donepezil (ARICEPT) 5 MG tablet Take 5 mg by mouth at bedtime.   Yes Historical Provider, MD  Estradiol 10 MCG TABS Place 1 tablet vaginally once a week. GIVEN ON WEDNESDAYS   Yes Historical Provider, MD  fentaNYL (DURAGESIC - DOSED MCG/HR) 25 MCG/HR patch Place 25 mcg onto the skin every 3 (three) days.   Yes Historical Provider, MD  niacin (NIASPAN) 500 MG CR tablet Take 500 mg by mouth at bedtime.   Yes Historical Provider, MD  oxybutynin (DITROPAN) 5 MG tablet Take 5 mg by mouth daily.    Yes Historical Provider, MD  oxyCODONE-acetaminophen (PERCOCET/ROXICET) 5-325 MG per tablet Take 1 tablet by mouth every 8 (eight) hours as needed for severe pain. 01/21/15  Yes Modena Jansky, MD  sennosides-docusate sodium (SENOKOT-S) 8.6-50 MG tablet Take 2 tablets by mouth at bedtime.   Yes Historical Provider, MD  traZODone (DESYREL) 50 MG tablet Take 100 mg by mouth at bedtime.    Yes Historical Provider, MD  vitamin B-12 (CYANOCOBALAMIN) 1000 MCG tablet Take 1,000 mcg by mouth daily.   Yes Historical Provider, MD  carvedilol (COREG) 12.5 MG tablet Take 1 tablet (12.5 mg total) by mouth 2 (two) times daily with a meal. Patient not taking: Reported on 02/23/2015 11/01/13   Volanda Napoleon, MD  menthol-cetylpyridinium (CEPACOL) 3 MG lozenge Take 1 lozenge by mouth as needed for sore throat.    Historical Provider, MD    Current Facility-Administered Medications  Medication Dose Route Frequency Provider Last Rate Last Dose  . sterile water (preservative free)  injection             Allergies as of 02/23/2015  . (No Known Allergies)     Review of Systems:    This is positive for those things mentioned in the HPI, All other review of systems are negative.       Physical Exam:  Vital signs in last 24 hours: Temp:  [98.4 F (36.9 C)] 98.4 F (36.9 C) (10/15 2131) Pulse Rate:  [35-145] 117 (10/15 2341) Resp:  [11-26] 15 (10/15 2341) BP: (105-145)/(58-108) 123/87 mmHg (10/15 2341) SpO2:  [92 %-100 %] 96 % (10/15 2341)    General:  Well-developed, well-nourished and in no acute distress Eyes:  anicteric. ENT:   Mouth and posterior pharynx free of lesions.  Neck:   supple w/o thyromegaly or mass.  Lungs: Clear to auscultation bilaterally. Heart:  S1S2, no rubs, murmurs, gallops. Abdomen:  soft, non-tender, no hepatosplenomegaly, hernia, or mass and BS+.  Rectal: Lymph:  no cervical or supraclavicular adenopathy. Extremities:   no edema Skin   no rash. Neuro:  A&O x 3.  Psych:  appropriate mood and  Affect.   Data Reviewed:   LAB RESULTS:  Recent Labs  02/23/15 2144  WBC 10.7*  HGB 13.9  HCT 41.9  PLT 215   BMET  Recent Labs  02/23/15 2144  NA 139  K 4.1  CL 105  CO2 25  GLUCOSE 129*  BUN 19  CREATININE 1.24*  CALCIUM 9.5   LFT No results for input(s): PROT, ALBUMIN, AST, ALT, ALKPHOS, BILITOT, BILIDIR, IBILI in the last 72 hours. PT/INR No results for input(s): LABPROT, INR in the last 72 hours.  STUDIES: Dg Chest Port 1 View  02/23/2015  CLINICAL DATA:  Patient complains of choking on chicken. Large amount of saliva. No actual vomiting. History of atrial fibrillation, type 2 diabetes, small cell carcinoma of lung, sick sinus syndrome. Former smoker quit in 1984. EXAM: PORTABLE CHEST 1 VIEW COMPARISON:  07/12/2011 FINDINGS: Cardiac pacemaker. Shallow inspiration. Volume loss in the right lung with opacity in the right apex probably postoperative given the history of lung cancer. No change in appearance  since the previous study. Large calcified granuloma in the left upper lung with calcified lymph node in the left aortopulmonic window region. No focal airspace disease or consolidation in the lungs. Mediastinal contours are unchanged since previous study. IMPRESSION: No evidence of active pulmonary disease. No change since prior study. Right apical opacity with volume loss in the right lung likely postoperative. Large calcified granulomas. Electronically  Signed   By: Lucienne Capers M.D.   On: 02/23/2015 21:53     Impression / Plan:   50 yr F with food impaction and afib with RVR Will proceed with emergent EGD, explained the benefits and risks of the procedure to patient and family She is DNR, will reverse for the procedure, patient and family is aware Post procedure she will be admitted for management of afib with RVR    K. Denzil Magnuson , MD [phone] Mon-Fri 8a-5p [phone] after 5p, weekends, holidays @  02/24/2015, 12:03 AM

## 2015-02-25 ENCOUNTER — Encounter (HOSPITAL_COMMUNITY): Payer: Self-pay | Admitting: Gastroenterology

## 2015-02-25 ENCOUNTER — Inpatient Hospital Stay (HOSPITAL_COMMUNITY): Payer: Medicare Other

## 2015-02-25 DIAGNOSIS — D72829 Elevated white blood cell count, unspecified: Secondary | ICD-10-CM

## 2015-02-25 DIAGNOSIS — J9601 Acute respiratory failure with hypoxia: Secondary | ICD-10-CM | POA: Diagnosis present

## 2015-02-25 DIAGNOSIS — F0390 Unspecified dementia without behavioral disturbance: Secondary | ICD-10-CM | POA: Diagnosis present

## 2015-02-25 DIAGNOSIS — E785 Hyperlipidemia, unspecified: Secondary | ICD-10-CM

## 2015-02-25 DIAGNOSIS — J690 Pneumonitis due to inhalation of food and vomit: Secondary | ICD-10-CM | POA: Diagnosis present

## 2015-02-25 DIAGNOSIS — I5032 Chronic diastolic (congestive) heart failure: Secondary | ICD-10-CM | POA: Diagnosis present

## 2015-02-25 DIAGNOSIS — D638 Anemia in other chronic diseases classified elsewhere: Secondary | ICD-10-CM

## 2015-02-25 DIAGNOSIS — I4891 Unspecified atrial fibrillation: Secondary | ICD-10-CM

## 2015-02-25 DIAGNOSIS — N183 Chronic kidney disease, stage 3 unspecified: Secondary | ICD-10-CM | POA: Diagnosis present

## 2015-02-25 DIAGNOSIS — I5033 Acute on chronic diastolic (congestive) heart failure: Secondary | ICD-10-CM | POA: Diagnosis present

## 2015-02-25 DIAGNOSIS — K222 Esophageal obstruction: Secondary | ICD-10-CM

## 2015-02-25 DIAGNOSIS — F329 Major depressive disorder, single episode, unspecified: Secondary | ICD-10-CM

## 2015-02-25 LAB — BLOOD GAS, ARTERIAL
ACID-BASE DEFICIT: 6.9 mmol/L — AB (ref 0.0–2.0)
Bicarbonate: 18.1 mEq/L — ABNORMAL LOW (ref 20.0–24.0)
DRAWN BY: 308601
Delivery systems: POSITIVE
Expiratory PAP: 5
FIO2: 0.4
INSPIRATORY PAP: 10
Mode: POSITIVE
O2 SAT: 95.2 %
PCO2 ART: 36.6 mmHg (ref 35.0–45.0)
PO2 ART: 86.1 mmHg (ref 80.0–100.0)
Patient temperature: 98.7
TCO2: 16.8 mmol/L (ref 0–100)
pH, Arterial: 7.315 — ABNORMAL LOW (ref 7.350–7.450)

## 2015-02-25 LAB — BRAIN NATRIURETIC PEPTIDE: B NATRIURETIC PEPTIDE 5: 2112.6 pg/mL — AB (ref 0.0–100.0)

## 2015-02-25 LAB — MAGNESIUM: Magnesium: 1.4 mg/dL — ABNORMAL LOW (ref 1.7–2.4)

## 2015-02-25 LAB — TROPONIN I: TROPONIN I: 0.54 ng/mL — AB (ref <0.031)

## 2015-02-25 MED ORDER — DILTIAZEM HCL 100 MG IV SOLR
5.0000 mg/h | INTRAVENOUS | Status: DC
  Filled 2015-02-25: qty 100

## 2015-02-25 MED ORDER — PIPERACILLIN-TAZOBACTAM 3.375 G IVPB
3.3750 g | Freq: Three times a day (TID) | INTRAVENOUS | Status: DC
  Administered 2015-02-25 – 2015-02-28 (×10): 3.375 g via INTRAVENOUS
  Filled 2015-02-25 (×12): qty 50

## 2015-02-25 MED ORDER — DIGOXIN 0.25 MG/ML IJ SOLN
0.1250 mg | Freq: Once | INTRAMUSCULAR | Status: AC
  Administered 2015-02-25: 0.25 mg via INTRAVENOUS
  Filled 2015-02-25: qty 0.5

## 2015-02-25 MED ORDER — ALBUMIN HUMAN 5 % IV SOLN
12.5000 g | Freq: Once | INTRAVENOUS | Status: DC
  Filled 2015-02-25: qty 250

## 2015-02-25 MED ORDER — ENSURE ENLIVE PO LIQD
237.0000 mL | Freq: Two times a day (BID) | ORAL | Status: DC
  Administered 2015-02-25 – 2015-02-28 (×5): 237 mL via ORAL

## 2015-02-25 MED ORDER — MAGNESIUM SULFATE 2 GM/50ML IV SOLN
2.0000 g | Freq: Once | INTRAVENOUS | Status: AC
  Administered 2015-02-25: 2 g via INTRAVENOUS
  Filled 2015-02-25: qty 50

## 2015-02-25 MED ORDER — SODIUM CHLORIDE 0.9 % IV SOLN
1.0000 g | Freq: Once | INTRAVENOUS | Status: DC
  Filled 2015-02-25: qty 10

## 2015-02-25 MED ORDER — IPRATROPIUM BROMIDE 0.02 % IN SOLN
0.5000 mg | RESPIRATORY_TRACT | Status: DC | PRN
Start: 2015-02-25 — End: 2015-03-01

## 2015-02-25 MED ORDER — LEVALBUTEROL HCL 1.25 MG/0.5ML IN NEBU
1.2500 mg | INHALATION_SOLUTION | RESPIRATORY_TRACT | Status: DC | PRN
  Filled 2015-02-25: qty 0.5

## 2015-02-25 NOTE — Clinical Documentation Improvement (Signed)
Hospitalist  (please document your response in the progress notes and discharge summary, not on the query form itself.)  Please document the TYPE of atrial fibrillation in the progress noes and discharge summary:  - Chronic  - Paroxsymal  - Other type of atrial fibrillation  - Unable to clinically determine  Clinical Information: "Atrial Fibrillation" is documented in the current medical record.   Please exercise your independent, professional judgment when responding. A specific answer is not anticipated or expected.   Thank You, Erling Conte  RN BSN CCDS [phone] Health Information Management [place]

## 2015-02-25 NOTE — Progress Notes (Signed)
I was notified by pt's nurse that she was c/o dyspnea around 8:30pm. It was noted that she had course breath sounds and had been receiving IVF. BP was soft 935T systolic and HR elevated in 130s-140s after having received lopressor 2.'5mg'$  IV at 6pm. Lasix '20mg'$  IV given along with additional dose of lopressor 2.'5mg'$ . The patient's breathing worsened however and she complained one time of chest pain and some nausea. At this point, pt had very coarse breath sounds, tachypnea, no LE edema. Was awake and talking but confused. An additional dose of lasix '40mg'$  IV and lopressor '5mg'$  was given, EKG obtained (A fib with RVR and LBBB), labs obtained, x-ray, pt transferred to SDU and placed on Bipap. She was then resting comfortably and VS were stable. HR however then increased again to the 140s and BP dropped to 70/40. Spoke with Dr. Posey Pronto, gave digoxin 0.'25mg'$  IV, and spoke with Dr. Ashok Cordia for further recommendations. He recommended diltiazem drip with calcium gluconate with the hopes that this would not drop her pressure and increase her BP with improved cardiac output. He did not recommend any additional fluids given pulm edema on cxr. However, HR has come down on its on and BP has also improved. Will continue to monitor and if HR increases again will proceed with diltiazem and calcium gluconate. Also covering with zosyn for possible aspiration pna given increase in WBC and possible infiltrate on cxr. I discussed this with the patient's daughter who is POA and she is understanding of patient's critical situation.

## 2015-02-25 NOTE — Progress Notes (Signed)
eLink Physician-Brief Progress Note Patient Name: [name] DOB: [birth year] MRN: [id number]   Date of Service  02/25/2015  HPI/Events of Note  Acute hypoxic respiratory failure in A fib w/ RVR. K 4.4.   eICU Interventions  Recommended 1amp Ca Gluconate, IV Digoxin, Diltiazem gtt '5mg'$ /hr. Continue BiPAP. Check serum Magnesium. Hold Lopressor.     Intervention Category Major Interventions: Arrhythmia - evaluation and management  Tera Partridge 02/25/2015, 1:12 AM

## 2015-02-25 NOTE — Progress Notes (Signed)
I have spoken with Ms. Brownstein daughter over the phone and we will meet tomorrow morning ~1000 (she will call back to confirm time) to discuss [place] further and offer support. Thank you for this consult.   Vinie Sill, NP Palliative Medicine Team Pager # [phone] (M-F 8a-5p) Team Phone # [phone] (Nights/Weekends)

## 2015-02-25 NOTE — Progress Notes (Signed)
Initial Nutrition Assessment  DOCUMENTATION CODES:   Not applicable  INTERVENTION:  - Will order Ensure Enlive BID, each supplement provides 350 kcal and 20 grams of protein - Encourage PO intakes of meals and supplements - RD will continue to monitor for needs  NUTRITION DIAGNOSIS:   Inadequate oral intake related to acute illness as evidenced by meal completion < 25%.  GOAL:   Patient will meet greater than or equal to 90% of their needs  MONITOR:   PO intake, Supplement acceptance, Weight trends, Labs, I & O's  REASON FOR ASSESSMENT:   Malnutrition Screening Tool, Consult Diet education  ASSESSMENT:   81 y.o. female with Past medical history of atrial fibrillation, not on any anticoagulation due to recurrent fall, diabetes mellitus type 2, coronary artery disease, mood disorder, pacemaker implant, chronic anemia, lung cancer.  Pt seen for MST and consult. BMI indicates normal weight status. Unable to complete physical assessment as pt on bedpan. She states she has been constipated since admission but she did not have this issue PTA. RN reports pt had a few sips of water this AM but has not had anything to eat.  Pt states that PTA she felt she was eating well but that "other people" did not agree. She typically skipped breakfast and ate lunch and dinner. She was unable to give examples of what she might eat for these two meals. Asked pt about nutrition supplements PTA and she reported that her daughter was buying her "something in a bottle" PTA but she was unable to describe what the contents of the bottle tasted like.   She reports UBW of 130 lbs and feels she has not had weight loss recently. Per chart review, pt has lost 2 lbs (1.6% body weight in the past 1 month which is not significant for time frame.   Not meeting needs; will order Ensure Enlive BID to supplement. Medications reviewed. Labs reviewed; creatinine elevated, Ca: 8.1 mg/dL, Mg: 1.4 mg/dL, GFR: 49.   Diet  Order:  DIET - DYS 1 Room service appropriate?: Yes; Fluid consistency:: Thin  Skin:  Reviewed, no issues  Last BM:  PTA  Height:   Ht Readings from Last 1 Encounters:  02/24/15 '5\' 5"'$  (1.651 m)    Weight:   Wt Readings from Last 1 Encounters:  02/24/15 122 lb 9.2 oz (55.6 kg)    Ideal Body Weight:  56.82 kg (kg)  BMI:  Body mass index is 20.4 kg/(m^2).  Estimated Nutritional Needs:   Kcal:  2595-6387  Protein:  55-65 grams  Fluid:  1.8-2 L/day  EDUCATION NEEDS:   No education needs identified at this time     Jarome Matin, RD, LDN Inpatient Clinical Dietitian Pager # [phone] After hours/weekend pager # [phone]

## 2015-02-25 NOTE — Progress Notes (Signed)
ANTIBIOTIC CONSULT NOTE - INITIAL  Pharmacy Consult for zosyn Indication: pneumonia  No Known Allergies  Patient Measurements: Height: '5\' 5"'$  (165.1 cm) Weight: 122 lb 9.2 oz (55.6 kg) IBW/kg (Calculated) : 57 Adjusted Body Weight:   Vital Signs: Temp: 98.2 F (36.8 C) (10/16 2035) Temp Source: Oral (10/16 2035) BP: 141/102 mmHg (10/16 2258) Pulse Rate: 104 (10/16 2336) Intake/Output from previous day: 10/16 0701 - 10/17 0700 In: 1523.3 [I.V.:1523.3] Out: -  Intake/Output from this shift:    Labs:  Recent Labs  02/23/15 2144 02/24/15 0714 02/24/15 2317  WBC 10.7* 8.7 17.8*  HGB 13.9 11.4* 12.7  PLT 215 134* 221  CREATININE 1.24* 0.94 1.04*   Estimated Creatinine Clearance: 37.2 mL/min (by C-G formula based on Cr of 1.04). No results for input(s): VANCOTROUGH, VANCOPEAK, VANCORANDOM, GENTTROUGH, GENTPEAK, GENTRANDOM, TOBRATROUGH, TOBRAPEAK, TOBRARND, AMIKACINPEAK, AMIKACINTROU, AMIKACIN in the last 72 hours.   Microbiology: Recent Results (from the past 720 hour(s))  MRSA PCR Screening     Status: Abnormal   Collection Time: 02/24/15  2:39 AM  Result Value Ref Range Status   MRSA by PCR POSITIVE (A) NEGATIVE Final    Comment:        The GeneXpert MRSA Assay (FDA approved for NASAL specimens only), is one component of a comprehensive MRSA colonization surveillance program. It is not intended to diagnose MRSA infection nor to guide or monitor treatment for MRSA infections. RESULT CALLED TO, READ BACK BY AND VERIFIED WITH: DENNY,C/2W '@0546'$  ON 02/24/15 BY KARCZEWSKI,S.     Medical History: Past Medical History  Diagnosis Date  . Pacemaker     MDT-DDD  . Sick sinus syndrome (HCC)     tachy-brady  . Atrial fibrillation ([place])   . Arthritis   . CAD (coronary artery disease)   . DM (diabetes mellitus) ([place])     type II  . Diverticulitis   . Depression   . Iron deficiency anemia, unspecified 12/30/2012  . Small cell carcinoma of lung ([place]) 12/30/2012  .  Anemia of chronic renal failure, stage 3 (moderate) 06/15/2014    Medications:  Anti-infectives    Start     Dose/Rate Route Frequency Ordered Stop   02/25/15 0115  piperacillin-tazobactam (ZOSYN) IVPB 3.375 g     3.375 g 12.5 mL/hr over 240 Minutes Intravenous 3 times per day 02/25/15 0106       Assessment: Patient on floor but found SOB, restless and HR in 130-150s.  Patient transferred to ICU.  Zosyn per pharmacy for Asp. PNA ordered.   Goal of Therapy:  Zosyn based on renal function  Appropriate antibiotic dosing for renal function; eradication of infection   Plan:  Follow up culture results  Zosyn 3.375g IV Q8H infused over 4hrs.   Tyler Deis, Shea Stakes [place] 02/25/2015,1:07 AM

## 2015-02-25 NOTE — Progress Notes (Signed)
Speech Language Pathology Treatment: Dysphagia  Patient Details Name: [name] MRN: [id number] DOB: 09-10-1931 Today's Date: 02/25/2015 Time: 1050-1109 SLP Time Calculation (min) (ACUTE ONLY): 19 min  Assessment / Plan / Recommendation Clinical Impression  Note pt's transfer to SD unit, RN reports likely due to pulmonary edema.  Pt requests water upon SLP entrance to room.  SLP observed pt consuming Ensure and water via straw.  Timely swallow and no indications of airway compromise or significant dysphagia.  Pt's voice remained clear and strong throughout intake.    Pt reports significant displeasure with pureed diet and given she had change in status, recommend downgrade to full liquid diet only.  Suspect clearance of full liquids will be better than solids and that pt will consume more.    Pt admits to premorbid dysphagia to foods more than drinks with sensation of food lodging in throat.  She states she just "has to wait" for it to clear when occurs and states "dry swallows may or may not help" when directly asked.  Consumption of liquids does not facilitate clearance of solids per pt.     Pt had undergone an MBS study in 1505, uncertain of results.  But note per barium swallow 04/2009 pt with consistent laryngeal penetration with cough and some pooling in pharynx - suggesting pharyngeal component to dysphagia as well.    Recommend continue strict precautions.  GI MD, when pt advances diet, would she be appropriate to advance to Dys2/ground meats or Dys3/ground meats with precautions as pt reports severe displeasure with pureed?  Thanks.    HPI Other Pertinent Information: [name] is a 81 y.o. female with Past medical history of atrial fibrillation, not on any anticoagulation due to recurrent fall, diabetes mellitus type 2, coronary artery disease, mood disorder, pacemaker implant, chronic anemia, lung cancer.The patient is presenting with complaints of choking episode while eating  supper.   Pertinent Vitals Pain Assessment: No/denies pain  SLP Plan  Continue with current plan of care    Recommendations Diet recommendations: Nectar-thick liquid;Thin liquid (full liquids) Liquids provided via: Cup;Straw Medication Administration: Whole meds with liquid (crush if large and not contraindicated) Supervision: Patient able to self feed (set up assist) Compensations: Small sips/bites;Slow rate;Follow solids with liquid Postural Changes and/or Swallow Maneuvers: Upright 30-60 min after meal;Seated upright 90 degrees              Follow up Recommendations: None Plan: Continue with current plan of care    Kelly, [facility] SLP [phone]

## 2015-02-25 NOTE — Progress Notes (Signed)
CMT notified writer of HR sustaining in the 130s. PA on call notified, new orders given and followed.   When entering the room to give metoprolol writer heard the patient wheezing and sounded wet. Upon ascultation wheezing heard throughout lungs. Patient stated she felt SOB. PA on call notified and orders for lasix and foley given and followed.  CMT notified writer of HR sustaining in the 130s-150s again. Patient was found to be restless, SOB, and yelling out for help. Patient had increased work of breathing and was anxious, she grabbed her chest and when asked if she had chest pain she stated "no, but please help me, I feel like I'm going to die." PA on call notified and asked to come to bedside.   PA on call at bedside, new orders given, orders to be transferred to SDU, RRT and RT called to bedside. Patient transferred to 1236 and report given to [place], Therapist, sports.

## 2015-02-25 NOTE — Progress Notes (Signed)
PT Cancellation Note  Patient Details Name: [name] MRN: [id number] DOB: 1931/11/22   Cancelled Treatment:    Reason Eval/Treat Not Completed: Patient declined, no reason specified. Will check back another day.    Weston Anna, MPT Pager: [phone]

## 2015-02-25 NOTE — Progress Notes (Signed)
*  PRELIMINARY RESULTS* Echocardiogram 2D Echocardiogram has been performed.  [name] 02/25/2015, 2:37 PM

## 2015-02-25 NOTE — Progress Notes (Addendum)
Patient ID: [name], female   DOB: [birth year], 81 y.o.   MRN: [id number] [facility] PROGRESS NOTE  [name] ZSW:[id number] DOB: 13-Dec-1931 DOA: 02/23/2015 PCP: Reymundo Poll, MD  Brief narrative:    81 y.o. female with past medical history of atrial fibrillation, not on any anticoagulation due to recurrent fall, hypertension, dementia, depression, CAD s/p pacemaker who presented to [facility] ED from [facility] place with reports of choking sensation while eating supper. Bedside endoscopy done in ED by GI with findings of food impaction in esophagus at UES s/p disimpaction.  Pt also found to be a fib with RVR and started on Cardizem drip. She was stable since admission and was transferred to the floor 02/24/2015. The night of 02/24/15, pt was more dyspneic with coarse breath sounds. CXR showed development of diffuse interstitial opacities concerning for interstitial edema in addition to ill-defined opacity in right lung base which was concerning for possible aspiration pneumonia. Pt subsequently transferred to SDU and she has required BiPAP to keep O2 saturation above 90%. She was started on zosyn for aspiration pneumonia.  Assessment/Plan:    Principal Problem: Esophageal foreign body / Peptic stricture of esophagus - S/P EGD with findings of food impaction in esophagus at UES s/p disimpaction - SLP evaluation most recently 10/17 - nectar thick liquids  - Aspiration precautions  - Continue protonix 40 mg IV Q 12 hours   Active Problems: Acute respiratory failure with hypoxia (HCC) / Aspiration pneumonia (HCC) / Leukocytosis - Pt was in respiratory distress last night with infiltrates seen on CXR concerning for interstitial edema and right lower lobe pneumonia concerning for aspiration - She was given total of 60 mg IV lasix last night - Started on zosyn for aspiration pneumonia - Currently on BiPAP to keep O2 saturation above 90% - Add xopenex and atrovent every 4 hours as needed for  shortness of breath or wheezing   Atrial fibrillation with RVR ([place]) / Pacemaker-MDT dual    - CHADS vasc score at least 3 (age, htn, vascular) - Rate initially controlled with Cardizem drip; changed to metoprolol 2.5 mg IV every 6 hours - Not on AC due to dementia, risk of falls - She is on daily aspirin  - Has episodes of tachy-brady - Pacemaker in place - I have consulted palliative care for goals of care   Acute on chronic diastolic CHF - Last 2 D ECHO in 2008 with preserved EF - Has pacemaker in place - BNP on this admission 2112 - She has received lasix total of 60 mg IV last night - BP on soft side this am so would not give lasix at this point - She is not on any IV fluids at this time   Coronary atherosclerosis of native coronary artery /  Elevated troponin - Likely demand ischemia from acute decompensated diastolic CHF  - Trop as high as 0.87 but leveled down to 0.54 - Likely not a candidate for aggressive cardiac work up due to dementia and multiple other comorbidities - Consulted PCT for goals of care   Elevated D dimer - Although development of PE most certainly a possibility since pt has A fib not on AC other than aspirin - She is not a candidate for [facility] due to dementia and risk of falls - At this time, palliative care consultation is appropriate   Depression / Dementia  - Resume donepezil and celexa    Essential hypertension, benign - On low dose metoprolol 2.5 mg IV Q  6 hours  Dyslipidemia - Continue statin therapy   Anemia of chronic disease - Secondary to CKD - Hgb stable   CKD (chronic kidney disease) stage 3, GFR 30-59 ml/min - Baseline Cr about 9 months age 50.3 - Creatinine on this admission within baseline range   Hypomagnesemia - Supplemented today   Small cell carcinoma of lung (HCC) - Remote history, limited stage - Has followed with Dr. Marin Olp  DVT Prophylaxis  - Heparin subQ in hospital   Code Status: DNR/DNI Family Communication:   Family not at the bedside this morning  Disposition Plan: remains in SDU because she needs BiPAP  IV access:  Peripheral IV  Procedures and diagnostic studies:    Ct Head Wo Contrast 02/24/2015 No acute intracranial abnormalities. Chronic atrophy and small vessel ischemic changes. Electronically Signed   By: Lucienne Capers M.D.   On: 02/24/2015 05:09   Dg Chest Port 1 View 02/23/2015   No evidence of active pulmonary disease. No change since prior study. Right apical opacity with volume loss in the right lung likely postoperative. Large calcified granulomas. Electronically Signed   By: Lucienne Capers M.D.   On: 02/23/2015 21:53   Medical Consultants:  Palliative care consulted 10/17   Other Consultants:  PT eval Nutrition SLP  IAnti-Infectives:   Zosyn 02/22/2015 -->     Leisa Lenz, MD  [facility] Pager [phone]  Time spent in minutes: 25 minutes  If 7PM-7AM, please contact night-coverage [email or web address] Password [email or web address] 02/25/2015, 11:35 AM   LOS: 1 day    HPI/Subjective: Overnigh more respiratory dsitress and patient needed to be placed on BiPAP.  Objective: Filed Vitals:   02/25/15 0700 02/25/15 0800 02/25/15 0832 02/25/15 1000  BP: 144/90 136/60  112/81  Pulse: 125 98  32  Temp:   98.5 F (36.9 C)   TempSrc:   Axillary   Resp: '20 19  18  '$ Height:      Weight:      SpO2: 100% 98%  95%    Intake/Output Summary (Last 24 hours) at 02/25/15 1135 Last data filed at 02/25/15 1000  Gross per 24 hour  Intake   1372 ml  Output    500 ml  Net    872 ml    Exam:   General:  Pt is alert, on BIPAP  Cardiovascular: irregular rhythm,  S1,S2 appreciated   Respiratory: Coarse breath sounds, no wheezing  Abdomen: (+) BS, non tender abdomen   Extremities: No leg swelling, pulses  palpable bilaterally  Neuro: Nonfocal  Data Reviewed: Basic Metabolic Panel:  Recent Labs Lab 02/23/15 2144 02/24/15 0138 02/24/15 0714 02/24/15 2317  02/25/15 0031  NA 139  --  140 139  --   K 4.1  --  4.0 4.4  --   CL 105  --  110 111  --   CO2 25  --  21* 15*  --   GLUCOSE 129*  --  98 212*  --   BUN 19  --  16 15  --   CREATININE 1.24*  --  0.94 1.04*  --   CALCIUM 9.5  --  7.9* 8.1*  --   MG  --  1.3* 1.7  --  1.4*   Liver Function Tests:  Recent Labs Lab 02/24/15 0714  AST 29  ALT 9*  ALKPHOS 72  BILITOT 0.9  PROT 6.4*  ALBUMIN 3.1*   No results for input(s): LIPASE, AMYLASE in the last 168 hours. No results for input(s):  AMMONIA in the last 168 hours. CBC:  Recent Labs Lab 02/23/15 2144 02/24/15 0714 02/24/15 2317  WBC 10.7* 8.7 17.8*  NEUTROABS 7.7 7.0  --   HGB 13.9 11.4* 12.7  HCT 41.9 36.1 39.6  MCV 88.0 91.9 90.6  PLT 215 134* 221   Cardiac Enzymes:  Recent Labs Lab 02/24/15 0138 02/24/15 0714 02/24/15 1232 02/24/15 1811 02/25/15 0031  TROPONINI 0.16* 0.48* 0.87* 0.66* 0.54*   BNP: Invalid input(s): POCBNP CBG: No results for input(s): GLUCAP in the last 168 hours.  Recent Results (from the past 240 hour(s))  MRSA PCR Screening     Status: Abnormal   Collection Time: 02/24/15  2:39 AM  Result Value Ref Range Status   MRSA by PCR POSITIVE (A) NEGATIVE Final     Scheduled Meds: . albumin human  12.5 g Intravenous Once  . antiseptic oral rinse  7 mL Mouth Rinse BID  . aspirin EC  81 mg Oral Daily  . calcium gluconate  1 g Intravenous Once  . Chlorhexidine Gluconate Cloth  6 each Topical Daily  . feeding supplement (ENSURE ENLIVE)  237 mL Oral BID BM  . heparin  5,000 Units Subcutaneous 3 times per day  . metoprolol  2.5 mg Intravenous 4 times per day  . mupirocin ointment  1 application Nasal BID  . pantoprazole (PROTONIX) IV  40 mg Intravenous Q12H  . piperacillin-tazobactam (ZOSYN)  IV  3.375 g Intravenous 3 times per day  . sodium chloride  3 mL Intravenous Q12H   Continuous Infusions: . sodium chloride 10 mL/hr at 02/25/15 1130  . diltiazem (CARDIZEM) infusion

## 2015-02-25 NOTE — Progress Notes (Signed)
PT remains off BiPAP- no respiratory distress at this time. PT states she is breathing well. RN aware.

## 2015-02-25 NOTE — Progress Notes (Signed)
RN stated that she removed PT from BiPAP around 0840 and placed on 4 lpm . PT appears to be doing well at this time- no respiratory distress at this time.

## 2015-02-25 NOTE — Care Management Note (Signed)
Case Management Note  Patient Details  Name: [name] MRN: [id number] Date of Birth: 1931-12-06  Subjective/Objective:           Resp. Obstruction and required bipap         Action/Plan:Date: February 25, 2015 Chart reviewed for concurrent status and case management needs. Will continue to follow patient for changes and needs: Velva Harman, RN, BSN, [place]   [phone]   Expected Discharge Date:                  Expected Discharge Plan:  Home/Self Care  In-House Referral:  Clinical Social Work  Discharge planning Services  CM Consult  Post Acute Care Choice:  NA Choice offered to:  NA  DME Arranged:  Apnea monitor DME Agency:  NA  HH Arranged:  NA HH Agency:  NA  Status of Service:  In process, will continue to follow  Medicare Important Message Given:    Date Medicare IM Given:    Medicare IM give by:    Date Additional Medicare IM Given:    Additional Medicare Important Message give by:     If discussed at [place] of Stay Meetings, dates discussed:    Additional Comments:  Leeroy Cha, RN 02/25/2015, 10:00 AM

## 2015-02-26 ENCOUNTER — Encounter (HOSPITAL_COMMUNITY): Payer: Self-pay | Admitting: Internal Medicine

## 2015-02-26 DIAGNOSIS — R7989 Other specified abnormal findings of blood chemistry: Secondary | ICD-10-CM

## 2015-02-26 DIAGNOSIS — I5032 Chronic diastolic (congestive) heart failure: Secondary | ICD-10-CM

## 2015-02-26 DIAGNOSIS — Z515 Encounter for palliative care: Secondary | ICD-10-CM

## 2015-02-26 DIAGNOSIS — I480 Paroxysmal atrial fibrillation: Secondary | ICD-10-CM | POA: Diagnosis present

## 2015-02-26 DIAGNOSIS — D631 Anemia in chronic kidney disease: Secondary | ICD-10-CM

## 2015-02-26 DIAGNOSIS — Z22322 Carrier or suspected carrier of Methicillin resistant Staphylococcus aureus: Secondary | ICD-10-CM

## 2015-02-26 DIAGNOSIS — C3490 Malignant neoplasm of unspecified part of unspecified bronchus or lung: Secondary | ICD-10-CM

## 2015-02-26 DIAGNOSIS — T18108S Unspecified foreign body in esophagus causing other injury, sequela: Secondary | ICD-10-CM

## 2015-02-26 DIAGNOSIS — J9601 Acute respiratory failure with hypoxia: Secondary | ICD-10-CM

## 2015-02-26 DIAGNOSIS — I5033 Acute on chronic diastolic (congestive) heart failure: Secondary | ICD-10-CM

## 2015-02-26 DIAGNOSIS — N183 Chronic kidney disease, stage 3 (moderate): Secondary | ICD-10-CM

## 2015-02-26 DIAGNOSIS — I4891 Unspecified atrial fibrillation: Secondary | ICD-10-CM

## 2015-02-26 DIAGNOSIS — J690 Pneumonitis due to inhalation of food and vomit: Secondary | ICD-10-CM

## 2015-02-26 DIAGNOSIS — R1310 Dysphagia, unspecified: Secondary | ICD-10-CM

## 2015-02-26 DIAGNOSIS — R0602 Shortness of breath: Secondary | ICD-10-CM | POA: Insufficient documentation

## 2015-02-26 HISTORY — DX: Carrier or suspected carrier of methicillin resistant Staphylococcus aureus: Z22.322

## 2015-02-26 MED ORDER — METOPROLOL TARTRATE 1 MG/ML IV SOLN
2.5000 mg | INTRAVENOUS | Status: DC | PRN
  Administered 2015-02-27 – 2015-02-28 (×5): 2.5 mg via INTRAVENOUS
  Filled 2015-02-26 (×6): qty 5

## 2015-02-26 MED ORDER — ENOXAPARIN SODIUM 40 MG/0.4ML ~~LOC~~ SOLN
40.0000 mg | SUBCUTANEOUS | Status: DC
  Administered 2015-02-27: 40 mg via SUBCUTANEOUS
  Filled 2015-02-26 (×3): qty 0.4

## 2015-02-26 NOTE — Consult Note (Signed)
Consultation Note Date: 02/26/2015   Patient Name: [name]  DOB: [birth year]  MRN: [id number]  Age / Sex: 81 y.o., female   PCP: Reymundo Poll, MD Referring Physician: Venetia Maxon Rama, MD  Reason for Consultation: Establishing goals of care  Palliative Care Assessment and Plan Summary of Established Goals of Care and Medical Treatment Preferences    Palliative Care Discussion Held Today:   I met today with Ms. Prabhu daughter, [name]. Ms. Colebank is sleeping and is confused per RN - this is likely close to baseline per Debbie. [name] says that her mother has always had a challenging personality as she was very controlling and strong-willed. She is estranged from most of her children and 2 grandsons that she raised with only one other daughter that occasionally will visit other than Debbie. [name] feels very stressed with the weight of all the decisions for her mothers care without support. She tells me that her mother had a serious hospitalization where she was on life support 8 yrs ago and she has been angry since as she has lost her independence and control. She has said since then that she is ready to die and does not want to be kept alive. [name] is tearful and torn between honoring her mother's wishes and feeling like she is not killing her. I told [name] that her mother has so many health issues and poor QOL that it is okay to focus more on comfort and to let her go. We discussed what this means and looks like and hospice care. [name] says she never knew this was an option. Today decided:   - No more BiPAP - No surgery/procedures or escalation of care - Ok for comfort feeds and liberalize diet if she desires and not eating recommended diet - Hopeful for return to ALF with hospice (with further decline may be eligible for hospice facility)  Will reassess her status tomorrow and further discuss with Debbie.    Contacts/Participants in Discussion: Primary Decision Maker: Daughter  Debbie   Goals of Care/Code Status/Advance Care Planning:   Code Status: DNR  Artificial feeding: no   Symptom Management:   Chronic Arthritic Knee Pain: She takes tylenol at ALF but still complains of pain according to family. She is asleep and not in pain currently.   Psycho-social/Spiritual:   Support System: Daughter Debbie supportive but stressed as there is little other support.   Prognosis: < 6 months or much less with continued decline and transition to comfort care.   Discharge Planning:  ALF with hospice vs hospice facility.        Chief Complaint/HPI: 81 yo female with a PMH of PAF, bradycardia and syncope status post pacemaker (not on anticoagulation secondary to high fall risk), CAD, diet controlled type 2 diabetes, hypertension, small cell lung cancer status post radiation and chemotherapy, frequent falls and stage III chronic kidney disease who was admitted 02/23/15 secondary to choking on her food. EGD done at bedside revealed food impaction which was disimpacted. She was also found to be in atrial fibrillation with RVR necessitating requiring cardizem. CXR showed diffuse interstitial opacities with possible aspiration pneumonia. She subsequently developed respiratory failure and was transferred to the SDU requiring BiPAP. Now off BiPAP and cardizem but continues with tachycardia ~110s-130 in atrial fibrillation.   Primary Diagnoses  Present on Admission:  . Atrial fibrillation with RVR ([place]) . (Resolved) Iron deficiency anemia . Small cell carcinoma of lung ([place]) . (Resolved) Anemia of chronic renal failure, stage 3 (  moderate) . Pacemaker-MDT dual . Essential hypertension, benign . Esophageal foreign body . Elevated troponin . (Resolved) Coronary atherosclerosis . Depression . Coronary atherosclerosis of native coronary artery . Anemia of chronic disease . Acute respiratory failure with hypoxia ([place]) . Aspiration pneumonia ([place]) . Leukocytosis . CKD  (chronic kidney disease) stage 3, GFR 30-59 ml/min . (Resolved) Chronic diastolic CHF (congestive heart failure) ([place]) . Acute on chronic diastolic CHF (congestive heart failure) ([place]) . Dyslipidemia . Dementia without behavioral disturbance . Hypomagnesemia . PAF (paroxysmal atrial fibrillation) (HCC)  Palliative Review of Systems:   Sleeping - did not awaken during assessment   I have reviewed the medical record, interviewed the patient and family, and examined the patient. The following aspects are pertinent.  Past Medical History  Diagnosis Date  . Pacemaker     MDT-DDD  . Sick sinus syndrome (HCC)     tachy-brady  . Atrial fibrillation ([place])   . Arthritis   . CAD (coronary artery disease)   . DM (diabetes mellitus) ([place])     type II  . Diverticulitis   . Depression   . Iron deficiency anemia, unspecified 12/30/2012  . Small cell carcinoma of lung ([place]) 12/30/2012  . Anemia of chronic renal failure, stage 3 (moderate) 06/15/2014  . MRSA carrier 02/26/2015   Social History   Social History  . Marital Status: Widowed    Spouse Name: N/A  . Number of Children: N/A  . Years of Education: N/A   Occupational History  . RETIRED    Social History Main Topics  . Smoking status: Former Smoker -- 3.00 packs/day for 40 years    Types: Cigarettes    Start date: 09/29/1944    Quit date: 08/30/1982  . Smokeless tobacco: Never Used     Comment: quit 33 years ago  . Alcohol Use: No  . Drug Use: No  . Sexual Activity: Not Asked   Other Topics Concern  . None   Social History Narrative   Family History  Problem Relation Age of Onset  . Diabetes    . Breast cancer    . Alcohol abuse      ADDICTION   Scheduled Meds: . albumin human  12.5 g Intravenous Once  . antiseptic oral rinse  7 mL Mouth Rinse BID  . aspirin EC  81 mg Oral Daily  . calcium gluconate  1 g Intravenous Once  . Chlorhexidine Gluconate Cloth  6 each Topical Daily  . enoxaparin (LOVENOX) injection   40 mg Subcutaneous Q24H  . feeding supplement (ENSURE ENLIVE)  237 mL Oral BID BM  . metoprolol  2.5 mg Intravenous 4 times per day  . mupirocin ointment  1 application Nasal BID  . pantoprazole (PROTONIX) IV  40 mg Intravenous Q12H  . piperacillin-tazobactam (ZOSYN)  IV  3.375 g Intravenous 3 times per day  . sodium chloride  3 mL Intravenous Q12H   Continuous Infusions: . sodium chloride 10 mL/hr at 02/25/15 1130   PRN Meds:.acetaminophen **OR** acetaminophen, ipratropium, levalbuterol, metoprolol, ondansetron **OR** ondansetron (ZOFRAN) IV Medications Prior to Admission:  Prior to Admission medications   Medication Sig Start Date End Date Taking? Authorizing Provider  acetaminophen (TYLENOL) 325 MG tablet Take 2 tablets (650 mg total) by mouth every 6 (six) hours as needed for mild pain, moderate pain, fever or headache. 01/21/15  Yes Modena Jansky, MD  Albuterol Sulfate 108 (90 BASE) MCG/ACT AEPB Inhale 1 puff into the lungs 4 (four) times daily.   Yes  Historical Provider, MD  aspirin EC 81 MG tablet Take 81 mg by mouth daily.   Yes Historical Provider, MD  atorvastatin (LIPITOR) 10 MG tablet Take 5 mg by mouth daily.  09/30/12  Yes Historical Provider, MD  Calcium Citrate-Vitamin D (CITRACAL + D PO) Take 2 tablets by mouth every morning.    Yes Historical Provider, MD  carvedilol (COREG) 6.25 MG tablet Take 6.25 mg by mouth 2 (two) times daily with a meal.   Yes Historical Provider, MD  citalopram (CELEXA) 20 MG tablet Take 20 mg by mouth at bedtime.    Yes Historical Provider, MD  donepezil (ARICEPT) 5 MG tablet Take 5 mg by mouth at bedtime.   Yes Historical Provider, MD  Estradiol 10 MCG TABS Place 1 tablet vaginally once a week. GIVEN ON WEDNESDAYS   Yes Historical Provider, MD  fentaNYL (DURAGESIC - DOSED MCG/HR) 25 MCG/HR patch Place 25 mcg onto the skin every 3 (three) days.   Yes Historical Provider, MD  niacin (NIASPAN) 500 MG CR tablet Take 500 mg by mouth at bedtime.   Yes  Historical Provider, MD  oxybutynin (DITROPAN) 5 MG tablet Take 5 mg by mouth daily.    Yes Historical Provider, MD  oxyCODONE-acetaminophen (PERCOCET/ROXICET) 5-325 MG per tablet Take 1 tablet by mouth every 8 (eight) hours as needed for severe pain. 01/21/15  Yes Modena Jansky, MD  sennosides-docusate sodium (SENOKOT-S) 8.6-50 MG tablet Take 2 tablets by mouth at bedtime.   Yes Historical Provider, MD  traZODone (DESYREL) 50 MG tablet Take 100 mg by mouth at bedtime.    Yes Historical Provider, MD  vitamin B-12 (CYANOCOBALAMIN) 1000 MCG tablet Take 1,000 mcg by mouth daily.   Yes Historical Provider, MD  carvedilol (COREG) 12.5 MG tablet Take 1 tablet (12.5 mg total) by mouth 2 (two) times daily with a meal. Patient not taking: Reported on 02/23/2015 11/01/13   Volanda Napoleon, MD  menthol-cetylpyridinium (CEPACOL) 3 MG lozenge Take 1 lozenge by mouth as needed for sore throat.    Historical Provider, MD   No Known Allergies CBC:    Component Value Date/Time   WBC 17.8* 02/24/2015 2317   WBC 6.5 08/27/2014 1341   WBC 6.0 10/10/2007 1151   HGB 12.7 02/24/2015 2317   HGB 11.9 08/27/2014 1341   HGB 11.2* 10/10/2007 1151   HCT 39.6 02/24/2015 2317   HCT 36.3 08/27/2014 1341   HCT 31.5* 10/10/2007 1151   PLT 221 02/24/2015 2317   PLT 122* 08/27/2014 1341   PLT 150 10/10/2007 1151   MCV 90.6 02/24/2015 2317   MCV 87 08/27/2014 1341   MCV 92.2 10/10/2007 1151   NEUTROABS 7.0 02/24/2015 0714   NEUTROABS 4.6 08/27/2014 1341   NEUTROABS 3.8 10/10/2007 1151   LYMPHSABS 1.0 02/24/2015 0714   LYMPHSABS 1.2 08/27/2014 1341   LYMPHSABS 1.5 10/10/2007 1151   MONOABS 0.6 02/24/2015 0714   MONOABS 0.4 10/10/2007 1151   EOSABS 0.0 02/24/2015 0714   EOSABS 0.2 08/27/2014 1341   EOSABS 0.2 10/10/2007 1151   BASOSABS 0.0 02/24/2015 0714   BASOSABS 0.0 08/27/2014 1341   BASOSABS 0.0 10/10/2007 1151   Comprehensive Metabolic Panel:    Component Value Date/Time   NA 139 02/24/2015 2317   NA  143 06/15/2014 1211   K 4.4 02/24/2015 2317   K 4.0 06/15/2014 1211   CL 111 02/24/2015 2317   CL 102 06/15/2014 1211   CO2 15* 02/24/2015 2317   CO2 28 06/15/2014 1211  BUN 15 02/24/2015 2317   BUN 12 06/15/2014 1211   CREATININE 1.04* 02/24/2015 2317   CREATININE 1.2 06/15/2014 1211   GLUCOSE 212* 02/24/2015 2317   GLUCOSE 82 06/15/2014 1211   CALCIUM 8.1* 02/24/2015 2317   CALCIUM 9.2 06/15/2014 1211   AST 29 02/24/2015 0714   AST 17 06/15/2014 1211   ALT 9* 02/24/2015 0714   ALT 9* 06/15/2014 1211   ALKPHOS 72 02/24/2015 0714   ALKPHOS 45 06/15/2014 1211   BILITOT 0.9 02/24/2015 0714   BILITOT 0.50 06/15/2014 1211   PROT 6.4* 02/24/2015 0714   PROT 6.9 06/15/2014 1211   ALBUMIN 3.1* 02/24/2015 0714   ALBUMIN 3.5 06/15/2014 1211    Physical Exam:  Vital Signs: BP 92/42 mmHg  Pulse 50  Temp(Src) 99.2 F (37.3 C) (Axillary)  Resp 20  Ht '5\' 5"'  (1.651 m)  Wt 55.6 kg (122 lb 9.2 oz)  BMI 20.40 kg/m2  SpO2 99% SpO2: SpO2: 99 % O2 Device: O2 Device: Nasal Cannula O2 Flow Rate: O2 Flow Rate (L/min): 2 L/min Intake/output summary:  Intake/Output Summary (Last 24 hours) at 02/26/15 1149 Last data filed at 02/26/15 0600  Gross per 24 hour  Intake    390 ml  Output    650 ml  Net   -260 ml   LBM: Last BM Date: 02/26/15 Baseline Weight: Weight: 55.6 kg (122 lb 9.2 oz) Most recent weight: Weight: 55.6 kg (122 lb 9.2 oz)  Exam Findings:   General: Sleeping, NAD HEENT: [place]/AT CVS: Irreg - atrial fib Resp: No labored breathing Abd: Soft, NT, ND Neuro: Sleeping - I did not awaken. Although RN reports she is confused at times.            Palliative Performance Scale: 30 %                Additional Data Reviewed: Recent Labs     02/24/15  0714  02/24/15  2317  WBC  8.7  17.8*  HGB  11.4*  12.7  PLT  134*  221  NA  140  139  BUN  16  15  CREATININE  0.94  1.04*     Time In: 1030 Time Out: 1150 Time Total: 38mn  Greater than 50%  of this time was  spent counseling and coordinating care related to the above assessment and plan.  Discussed plan of care with RN, Dr. RRockne Menghini   Signed by:  AVinie Sill NP Palliative Medicine Team Pager # [phone](M-F 8a-5p) Team Phone # [phone](Nights/Weekends)

## 2015-02-26 NOTE — Progress Notes (Signed)
Progress Note   [name] OQH:[id number] DOB: Nov 30, 1931 DOA: 02/23/2015 PCP: Reymundo Poll, MD   Brief Narrative:   [name] is an 81 y.o. female with a PMH of PAF, bradycardia and syncope status post pacemaker (not on anticoagulation secondary to high fall risk), moderately obstructive CAD with normal LV function, diet controlled type 2 diabetes, hypertension, limited stage small cell lung cancer status post radiation and chemotherapy, frequent falls and stage III chronic kidney disease who was admitted 02/23/15 secondary to choking on her food. EGD done at bedside revealed food impaction which was disimpacted. She was also found to be in atrial fibrillation with RVR necessitating treatment with a Cardizem drip. Chest x-ray done on admission showed diffuse interstitial opacities with possible aspiration pneumonia. She subsequently developed respiratory failure and was transferred to the SDU requiring BiPAP. A palliative care consultation was subsequently requested given her overall poor prognosis and multiple comorbidities.  Assessment/Plan:   Principal Problem: Esophageal foreign body / Peptic stricture of esophagus - S/P EGD with findings of food impaction in esophagus at UES s/p disimpaction 02/24/15. - SLP evaluation most recently 02/25/15 - nectar thick liquids recommended. - Continue Aspiration precautions.  - Continue protonix 40 mg IV Q 12 hours.  Active Problems: Acute respiratory failure with hypoxia (HCC) / Aspiration pneumonia (HCC) / Leukocytosis - Status post CXR 02/25/15 concerning for interstitial edema and right lower lobe pneumonia concerning for aspiration. - She was given total of 60 mg IV lasix last night, with net negative output of 350 mL. - Started on zosyn for aspiration pneumonia 02/25/15. - Initially required BiPAP to keep O2 saturation above 90%, now on nasal cannula oxygen with no plans to resume BiPAP. - Continue xopenex and atrovent every 4  hours as needed for shortness of breath or wheezing.   Paroxysmal Atrial fibrillation with RVR (HCC) / Pacemaker-MDT dual  - CHADS vasc score at least 3 (age, htn, vascular). - Rate initially controlled with Cardizem drip; changed to metoprolol 2.5 mg IV every 6 hours. - Not on AC due to dementia, risk of falls. - Continue daily aspirin.  - Has episodes of tachy-brady.  Pacemaker in place. - Palliative care team met with family today and will move towards full comfort care if no improvement over the next 24 hours.   Acute on chronic diastolic CHF - Last 2 D ECHO in 2008 with preserved EF. - Has pacemaker in place. - BNP on this admission 2112. - She has been diuresed, net negative I/O balance 350 mL.  Coronary atherosclerosis of native coronary artery / Elevated troponin - Likely demand ischemia from acute decompensated diastolic CHF.  - Trop as high as 0.87 but leveled down to 0.54. - Likely not a candidate for aggressive cardiac work up due to dementia and multiple other comorbidities. - Palliative care met with patient's family today to discuss goals of care.   Elevated D dimer - Although development of PE most certainly a possibility since pt has A fib not on AC other than aspirin. - She is not a candidate for [facility] due to dementia and risk of falls. - At this time, palliative care consultation is appropriate.   Depression / Dementia  - Continue donepezil and celexa.   Essential hypertension, benign - On low dose metoprolol 2.5 mg IV Q 6 hours.  Dyslipidemia - Continue statin therapy.   Anemia of chronic disease - Secondary to CKD. - Hgb stable.   CKD (chronic kidney disease) stage  3, GFR 30-59 ml/min - Baseline Cr about 9 months age 75.3. - Creatinine on this admission within baseline range.   Hypomagnesemia - Supplemented.   Small cell carcinoma of lung (HCC) - Remote history, limited stage. - Has followed with Dr. Marin Olp.  MRSA carrier -  Decontamination therapy ordered.  DVT Prophylaxis  - Lovenox ordered.   Code Status: DNR/DNI Family Communication: Family not at the bedside this morning. Met with palliative care team today.  Disposition Plan: Continue SDU for now, but will likely transition towards full comfort and plan to return to ALF with hospice services soon.   IV Access:    Peripheral IV   Procedures and diagnostic studies:   Ct Head Wo Contrast  02/24/2015  CLINICAL DATA:  Food impaction in the esophagus with endoscopy earlier. Probable peptic stricture in the distal esophagus. Hiatal hernia. Change in condition during medication application. EXAM: CT HEAD WITHOUT CONTRAST TECHNIQUE: Contiguous axial images were obtained from the base of the skull through the vertex without intravenous contrast. COMPARISON:  10/30/2013 FINDINGS: Diffuse cerebral atrophy. Ventricular dilatation likely representing central atrophy. Diffuse low attenuation change throughout the deep white matter consistent with small vessel ischemia. No change in appearance since previous studies. No mass effect or midline shift. No abnormal extra-axial fluid collections. Gray-white matter junctions are distinct. Basal cisterns are not effaced. No evidence of acute intracranial hemorrhage. No depressed skull fractures. Visualized paranasal sinuses and mastoid air cells are not opacified. Vascular calcifications. IMPRESSION: No acute intracranial abnormalities. Chronic atrophy and small vessel ischemic changes. Electronically Signed   By: Lucienne Capers M.D.   On: 02/24/2015 05:09   Dg Chest Port 1 View  02/25/2015  CLINICAL DATA:  Dyspnea.  Aspiration and respiratory failure. EXAM: PORTABLE CHEST 1 VIEW COMPARISON:  02/23/2015 FINDINGS: Dual lead right-sided pacemaker remains in place. Right apical opacity with volume loss, unchanged from prior exam. There is developing patchy opacity at the right lung base. Increasing interstitial opacities  concerning for developing pulmonary edema. Calcified granuloma are again seen. Blunting of both costophrenic angles, may reflect small effusions. Cardiomediastinal contours are unchanged. IMPRESSION: 1. Development of diffuse interstitial opacities, concerning for pulmonary edema. Question small pleural effusions. 2. Ill-defined patchy opacity at the right lung base, may be vascular, atelectasis, or aspiration. Electronically Signed   By: Jeb Levering M.D.   On: 02/25/2015 00:28   Dg Chest Port 1 View  02/23/2015  CLINICAL DATA:  Patient complains of choking on chicken. Large amount of saliva. No actual vomiting. History of atrial fibrillation, type 2 diabetes, small cell carcinoma of lung, sick sinus syndrome. Former smoker quit in 1984. EXAM: PORTABLE CHEST 1 VIEW COMPARISON:  07/12/2011 FINDINGS: Cardiac pacemaker. Shallow inspiration. Volume loss in the right lung with opacity in the right apex probably postoperative given the history of lung cancer. No change in appearance since the previous study. Large calcified granuloma in the left upper lung with calcified lymph node in the left aortopulmonic window region. No focal airspace disease or consolidation in the lungs. Mediastinal contours are unchanged since previous study. IMPRESSION: No evidence of active pulmonary disease. No change since prior study. Right apical opacity with volume loss in the right lung likely postoperative. Large calcified granulomas. Electronically Signed   By: Lucienne Capers M.D.   On: 02/23/2015 21:53     Medical Consultants:    Gastroenterology: Mauri Pole, MD  Palliative care: Pershing Proud, NP  Anti-Infectives:   Anti-infectives    Start  Dose/Rate Route Frequency Ordered Stop   02/25/15 0115  piperacillin-tazobactam (ZOSYN) IVPB 3.375 g     3.375 g 12.5 mL/hr over 240 Minutes Intravenous 3 times per day 02/25/15 0106        Subjective:   [name] is currently resting comfortably  and not terribly responsive to stimulation at this time.  Objective:    Filed Vitals:   02/26/15 0500 02/26/15 0539 02/26/15 0545 02/26/15 0630  BP: 116/78 97/60  99/35  Pulse:   41 96  Temp:      TempSrc:      Resp:   22 22  Height:      Weight:      SpO2: 96%  88% 99%    Intake/Output Summary (Last 24 hours) at 02/26/15 0716 Last data filed at 02/26/15 0600  Gross per 24 hour  Intake    530 ml  Output    850 ml  Net   -320 ml   Filed Weights   02/24/15 0800  Weight: 55.6 kg (122 lb 9.2 oz)    Exam: Gen:  NAD, resting Cardiovascular:  Heart sounds irregular, No M/R/G Respiratory:  Lungs diminished Gastrointestinal:  Abdomen soft, NT/ND, + BS Extremities:  No C/E/C   Data Reviewed:    Labs: Basic Metabolic Panel:  Recent Labs Lab 02/23/15 2144 02/24/15 0138 02/24/15 0714 02/24/15 2317 02/25/15 0031  NA 139  --  140 139  --   K 4.1  --  4.0 4.4  --   CL 105  --  110 111  --   CO2 25  --  21* 15*  --   GLUCOSE 129*  --  98 212*  --   BUN 19  --  16 15  --   CREATININE 1.24*  --  0.94 1.04*  --   CALCIUM 9.5  --  7.9* 8.1*  --   MG  --  1.3* 1.7  --  1.4*   GFR Estimated Creatinine Clearance: 37.2 mL/min (by C-G formula based on Cr of 1.04). Liver Function Tests:  Recent Labs Lab 02/24/15 0714  AST 29  ALT 9*  ALKPHOS 72  BILITOT 0.9  PROT 6.4*  ALBUMIN 3.1*   CBC:  Recent Labs Lab 02/23/15 2144 02/24/15 0714 02/24/15 2317  WBC 10.7* 8.7 17.8*  NEUTROABS 7.7 7.0  --   HGB 13.9 11.4* 12.7  HCT 41.9 36.1 39.6  MCV 88.0 91.9 90.6  PLT 215 134* 221   Cardiac Enzymes:  Recent Labs Lab 02/24/15 0138 02/24/15 0714 02/24/15 1232 02/24/15 1811 02/25/15 0031  TROPONINI 0.16* 0.48* 0.87* 0.66* 0.54*   D-Dimer:  Recent Labs  02/24/15 2317  DDIMER 0.91*   Thyroid function studies:  Recent Labs  02/24/15 0138  TSH 2.574   Sepsis Labs:  Recent Labs Lab 02/23/15 2144 02/24/15 0714 02/24/15 2317  WBC 10.7* 8.7 17.8*     Microbiology Recent Results (from the past 240 hour(s))  MRSA PCR Screening     Status: Abnormal   Collection Time: 02/24/15  2:39 AM  Result Value Ref Range Status   MRSA by PCR POSITIVE (A) NEGATIVE Final    Comment:        The GeneXpert MRSA Assay (FDA approved for NASAL specimens only), is one component of a comprehensive MRSA colonization surveillance program. It is not intended to diagnose MRSA infection nor to guide or monitor treatment for MRSA infections. RESULT CALLED TO, READ BACK BY AND VERIFIED WITH: DENNY,C/2W _0  ON  02/24/15 BY KARCZEWSKI,S.      Medications:   . albumin human  12.5 g Intravenous Once  . antiseptic oral rinse  7 mL Mouth Rinse BID  . aspirin EC  81 mg Oral Daily  . calcium gluconate  1 g Intravenous Once  . Chlorhexidine Gluconate Cloth  6 each Topical Daily  . feeding supplement (ENSURE ENLIVE)  237 mL Oral BID BM  . heparin  5,000 Units Subcutaneous 3 times per day  . metoprolol  2.5 mg Intravenous 4 times per day  . mupirocin ointment  1 application Nasal BID  . pantoprazole (PROTONIX) IV  40 mg Intravenous Q12H  . piperacillin-tazobactam (ZOSYN)  IV  3.375 g Intravenous 3 times per day  . sodium chloride  3 mL Intravenous Q12H   Continuous Infusions: . sodium chloride 10 mL/hr at 02/25/15 1130    Time spent: 35 minutes.  The patient is medically complex and requires high complexity decision making and coordination of care with multiple specialists.    LOS: 2 days   Eleshia Wooley  [facility] Pager [phone]. If unable to reach me by pager, please call my cell phone at [phone].  *Please refer to [email or web address], password [email or web address] to get updated schedule on who will round on this patient, as hospitalists switch teams weekly. If 7PM-7AM, please contact night-coverage at [email or web address], password [email or web address] for any overnight needs.  02/26/2015, 7:16 AM

## 2015-02-26 NOTE — Progress Notes (Addendum)
SLP Cancellation Note  Patient Details Name: [name] MRN: [id number] DOB: May 15, 1931   Cancelled treatment:       Reason Eval/Treat Not Completed: Other (comment) (pt sleeping and did not awaken to gentle verbal stimulation)  Note palliative meeting planned for today.  RN reports pt tolerating intake of Ensure but has been sleepy most of day.   [name], S.N.P.J. [facility] SLP [phone]

## 2015-02-26 NOTE — Progress Notes (Signed)
CSW consulted to assist with d/c planning. Pt is from [facility]. PN reviewed. Palliative Care Team is assisting with [place]. MD notes possible return to ALF with Hospice Services. CSW has sent available clinicals to ALF for review. CSW will contact pt's daughter, on 02/27/15 to assist with d/c planning needs.  Werner Lean LCSW [phone]

## 2015-02-26 NOTE — Progress Notes (Signed)
PT Cancellation Note  Patient Details Name: [name] MRN: [id number] DOB: July 29, 1931   Cancelled Treatment:    Reason Eval/Treat Not Completed: Medical issues which prohibited therapy (HR fluctuating from 115-146 at rest, RN requested no PT today. Will attempt tomorrow. )   [name] 02/26/2015, 12:45 PM [phone]

## 2015-02-27 DIAGNOSIS — I248 Other forms of acute ischemic heart disease: Secondary | ICD-10-CM

## 2015-02-27 DIAGNOSIS — F0390 Unspecified dementia without behavioral disturbance: Secondary | ICD-10-CM

## 2015-02-27 DIAGNOSIS — I480 Paroxysmal atrial fibrillation: Principal | ICD-10-CM

## 2015-02-27 MED ORDER — CITALOPRAM HYDROBROMIDE 20 MG PO TABS
20.0000 mg | ORAL_TABLET | Freq: Every day | ORAL | Status: DC
  Administered 2015-02-27: 20 mg via ORAL
  Filled 2015-02-27 (×2): qty 1

## 2015-02-27 MED ORDER — PANTOPRAZOLE SODIUM 40 MG IV SOLR
40.0000 mg | INTRAVENOUS | Status: DC
  Administered 2015-02-28: 40 mg via INTRAVENOUS
  Filled 2015-02-27: qty 40

## 2015-02-27 MED ORDER — CARVEDILOL 6.25 MG PO TABS
6.2500 mg | ORAL_TABLET | Freq: Two times a day (BID) | ORAL | Status: DC
  Administered 2015-02-27 – 2015-02-28 (×3): 6.25 mg via ORAL
  Filled 2015-02-27 (×4): qty 1

## 2015-02-27 MED ORDER — ALPRAZOLAM 0.25 MG PO TABS
0.2500 mg | ORAL_TABLET | Freq: Three times a day (TID) | ORAL | Status: DC | PRN
  Administered 2015-02-27 – 2015-02-28 (×3): 0.25 mg via ORAL
  Filled 2015-02-27 (×3): qty 1

## 2015-02-27 NOTE — Progress Notes (Addendum)
SLP Cancellation Note  Patient Details Name: [name] MRN: [id number] DOB: 10-21-1931   Cancelled treatment:       Reason Eval/Treat Not Completed: Other (comment) (pt requiring nursing assistance, note plans for comfort feeds and hospice follow up, will follow up for family education x1 as indicated.)  Per review of chart, intake appears to be minimal.    Luanna Salk, [facility] SLP [phone]

## 2015-02-27 NOTE — Progress Notes (Signed)
02/27/2015 Patient heart rate at 0755 was in the 120's, 130's to 170's. Patient was given Lopressor 2.5 mg  at 0800. Dr Rockne Menghini was notified and was made aware. [facility] RN.

## 2015-02-27 NOTE — Progress Notes (Signed)
Pt and family called out stating they needed Oxygen put back on because patient was having trouble breathing.  Upon entering room patient frowning, crying stating she is having trouble breathing. Checked O2 states and showed 94% on RA.  Applied nasal cannula to support.  Pt daughter stated that patient reached up and asked where her O2 was and then became very anxious. Patient able to calm down and is resting comfortably at this time. Shaneca Orne A

## 2015-02-27 NOTE — Care Management Note (Signed)
Case Management Note  Patient Details  Name: [name] MRN: [id number] Date of Birth: 1931-07-31  Subjective/Objective:  Transfer from SDU. Palliative-return ALF w/hospice services-comfort feeds.CSW following.                  Action/Plan:d/c plan ALF w/hospice.   Expected Discharge Date:                  Expected Discharge Plan:  Assisted Living / Rest Home  In-House Referral:  Clinical Social Work  Discharge planning Services  CM Consult  Post Acute Care Choice:  NA Choice offered to:  NA  DME Arranged:  Apnea monitor DME Agency:  NA  HH Arranged:    HH Agency:  NA  Status of Service:  In process, will continue to follow  Medicare Important Message Given:    Date Medicare IM Given:    Medicare IM give by:    Date Additional Medicare IM Given:    Additional Medicare Important Message give by:     If discussed at [place] of Stay Meetings, dates discussed:    Additional Comments:  Dessa Phi, RN 02/27/2015, 12:41 PM

## 2015-02-27 NOTE — Evaluation (Signed)
Physical Therapy Evaluation Patient Details Name: [name] MRN: [id number] DOB: Aug 19, 1931 Today's Date: 02/27/2015   History of Present Illness  [name] is an 81 y.o. female with a PMH of PAF, bradycardia and syncope status post pacemaker CAD, type 2 diabetes, hypertension, limited stage small cell lung cancer ,, frequent falls and stage III chronic kidney disease who was admitted 02/23/15 secondary to choking on her food. EGD done at bedside revealed food impaction which was disimpacted. Also found to be in atrial fibrillation with RVR necessitating treatment with a Cardizem drip. Chest x-ray  showed diffuse interstitial opacities with possible aspiration pneumonia. She subsequently developed respiratory failure and was transferred to the SDU requiring BiPAP. A palliative care consultation was subsequently requested given her overall poor prognosis and multiple comorbidities.  Clinical Impression  Patient is very weak, assisted to [facility] and back to bed with mod/mas assist. Patient will benefit from PT while in acute caare to establish level of care. Plans are to return to ALF with Hospice.    Follow Up Recommendations No PT follow up;Supervision/Assistance - 24 hour (plans to return to ALF followed by Hospice)    Equipment Recommendations  None recommended by PT    Recommendations for Other Services       Precautions / Restrictions Precautions Precautions: Fall Precaution Comments: incontinent      Mobility  Bed Mobility Overal bed mobility: Needs Assistance Bed Mobility: Rolling;Supine to Sit;Sit to Supine Rolling: Mod assist   Supine to sit: Mod assist Sit to supine: Mod assist   General bed mobility comments: extra time, assist with trunk and legs  Transfers Overall transfer level: Needs assistance   Transfers: Sit to/from Stand;Stand Pivot Transfers Sit to Stand: Mod assist Stand pivot transfers: Mod assist;+2 safety/equipment       General transfer  comment: bear hug pivot to [facility] and back  to bed, Another assistant for pericare in standing.  Ambulation/Gait                Stairs            Wheelchair Mobility    Modified Rankin (Stroke Patients Only)       Balance Overall balance assessment: History of Falls;Needs assistance Sitting-balance support: Bilateral upper extremity supported;Feet supported Sitting balance-Leahy Scale: Poor     Standing balance support: During functional activity;Bilateral upper extremity supported Standing balance-Leahy Scale: Poor                               Pertinent Vitals/Pain Pain Assessment: Faces Faces Pain Scale: Hurts whole lot Pain Location: periarea Pain Descriptors / Indicators: Discomfort;Grimacing;Guarding Pain Intervention(s): Limited activity within patient's tolerance;Repositioned    Home Living Family/patient expects to be discharged to:: Assisted living               Home Equipment: Walker - 2 wheels Additional Comments: has been mostly WC bound recnetly    Prior Function Level of Independence: Needs assistance   Gait / Transfers Assistance Needed: WC           Hand Dominance        Extremity/Trunk Assessment   Upper Extremity Assessment: Generalized weakness           Lower Extremity Assessment: Generalized weakness         Communication      Cognition Arousal/Alertness: Awake/alert Behavior During Therapy: WFL for tasks assessed/performed Overall Cognitive Status: Within Functional Limits for  tasks assessed                      General Comments      Exercises        Assessment/Plan    PT Assessment Patient needs continued PT services  PT Diagnosis Generalized weakness;Acute pain   PT Problem List Decreased activity tolerance;Decreased balance;Decreased mobility;Decreased safety awareness;Pain  PT Treatment Interventions Functional mobility training;Therapeutic activities;Patient/family  education   PT Goals (Current goals can be found in the Care Plan section) Acute Rehab PT Goals PT Goal Formulation: With patient/family Time For Goal Achievement: 03/13/15 Potential to Achieve Goals: Fair    Frequency Min 2X/week   Barriers to discharge        Co-evaluation               End of Session   Activity Tolerance: Patient limited by fatigue Patient left: in bed;with call bell/phone within reach;with bed alarm set Nurse Communication: Mobility status         Time: 7824-2353 PT Time Calculation (min) (ACUTE ONLY): 23 min   Charges:   PT Evaluation $Initial PT Evaluation Tier I: 1 Procedure PT Treatments $Therapeutic Activity: 8-22 mins   PT G Codes:        Claretha Cooper 02/27/2015, 4:51 PM Tresa Endo PT [phone]

## 2015-02-27 NOTE — Progress Notes (Signed)
Daily Progress Note   Patient Name: [name]       Date: 02/27/2015 DOB: Oct 30, 1931  Age: 81 y.o. MRN#: [id number] Attending Physician: Venetia Maxon Rama, MD Primary Care Physician: Reymundo Poll, MD Admit Date: 02/23/2015  Reason for Consultation/Follow-up: GOC  Subjective:     [name] is much more alert and in very good spirits. Daughter [name] is at bedside and tells me that her mother has been good today but had what seemed like a panic attack when she realized she was not wearing oxygen. She was better after placing her on oxygen and xanax.   [name] tells me that she did read through [facility] some and found this very helpful. [name] confirms the plan for return to ALF - if they will accept her with hospice to assist. [name] is still anxious about having all these decisions on her and I try and reassure her that my recommendations for more comfort based care and hospice came from what she told me about her mother and what her mother has told her she wishes - this is really [name]' decisions. Optimize care with conservative interventions but hopeful for d/c tomorrow.   Length of Stay: 3 days  Current Medications: Scheduled Meds:  . antiseptic oral rinse  7 mL Mouth Rinse BID  . aspirin EC  81 mg Oral Daily  . calcium gluconate  1 g Intravenous Once  . carvedilol  6.25 mg Oral BID WC  . Chlorhexidine Gluconate Cloth  6 each Topical Daily  . citalopram  20 mg Oral QHS  . enoxaparin (LOVENOX) injection  40 mg Subcutaneous Q24H  . feeding supplement (ENSURE ENLIVE)  237 mL Oral BID BM  . mupirocin ointment  1 application Nasal BID  . [START ON 02/28/2015] pantoprazole (PROTONIX) IV  40 mg Intravenous Q24H  . piperacillin-tazobactam (ZOSYN)  IV  3.375 g Intravenous 3 times per day  . sodium chloride  3 mL Intravenous Q12H    Continuous Infusions: . sodium chloride 10 mL/hr at 02/27/15 0510    PRN Meds: acetaminophen **OR** acetaminophen, ALPRAZolam, ipratropium,  levalbuterol, metoprolol, ondansetron **OR** ondansetron (ZOFRAN) IV  Palliative Performance Scale: 30%     Vital Signs: BP 104/80 mmHg  Pulse 115  Temp(Src) 98.2 F (36.8 C) (Oral)  Resp 20  Ht '5\' 5"'$  (1.651 m)  Wt 57.7 kg (127 lb 3.3 oz)  BMI 21.17 kg/m2  SpO2 100% SpO2: SpO2: 100 % O2 Device: O2 Device: Nasal Cannula O2 Flow Rate: O2 Flow Rate (L/min): 2 L/min  Intake/output summary:  Intake/Output Summary (Last 24 hours) at 02/27/15 1520 Last data filed at 02/27/15 1300  Gross per 24 hour  Intake    370 ml  Output    826 ml  Net   -456 ml   LBM: Last BM Date: 02/27/15 Baseline Weight: Weight: 55.6 kg (122 lb 9.2 oz) Most recent weight: Weight: 57.7 kg (127 lb 3.3 oz)  Physical Exam: General: NAD, lying in bed HEENT: [place] Back/AT, hard of hearing CVS: Irreg - atrial fib Resp: No labored breathing Abd: Soft, NT, ND Neuro: Awake, alert, pleasant, confused   Additional Data Reviewed: Recent Labs     02/24/15  2317  WBC  17.8*  HGB  12.7  PLT  221  NA  139  BUN  15  CREATININE  1.04*     Problem List:  Patient Active Problem List   Diagnosis Date Noted  . Demand ischemia ([place]) 02/27/2015  . PAF (paroxysmal  atrial fibrillation) ([place]) 02/26/2015  . MRSA carrier 02/26/2015  . Palliative care encounter   . SOB (shortness of breath)   . Acute respiratory failure with hypoxia ([place]) 02/25/2015  . Aspiration pneumonia ([place]) 02/25/2015  . Leukocytosis 02/25/2015  . CKD (chronic kidney disease) stage 3, GFR 30-59 ml/min 02/25/2015  . Acute on chronic diastolic CHF (congestive heart failure) ([place]) 02/25/2015  . Dyslipidemia 02/25/2015  . Dementia without behavioral disturbance 02/25/2015  . Hypomagnesemia 02/25/2015  . Atrial fibrillation with RVR ([place]) 02/24/2015  . Elevated troponin 02/24/2015  . Anemia of chronic disease 02/24/2015  . Esophageal foreign body   . Peptic stricture of esophagus   . Small cell carcinoma of lung ([place]) 12/30/2012  . Essential  hypertension, benign 09/22/2012  . Coronary atherosclerosis of native coronary artery 09/22/2012  . Pacemaker-MDT dual 07/14/2011  . Depression 12/22/2006     Palliative Care Assessment & Plan    Code Status:  DNR  Goals of Care:  Comfort based care but ok to continue medications to optimize health but no BiPAP or invasive procedures/interventions.   Desire for further Chaplaincy support:no  3. Symptom Management:  Anxiety: Agree with low dose xanax TID prn. Debbie assures me that there are always nursing staff 24/7 at ALF and she is frequently checked on (ALF is set up like hospital unit with rooms not far from nursing station).   Chronic Arthritic Knee Pain: She denies pain here. [name] says that she gets scheduled T[facility]enol at [facility] but still complains of pain. May consider low dose Oxy IR 2.5 mg every 8 hours prn if this pain continues.    5. Prognosis: < 6 months  5. Discharge Planning: ALF with hospice   Thank you for allowing the Palliative Medicine Team to assist in the care of this patient.   Time In: 1430 Time Out: 1500 Total Time 37mn Prolonged Time Billed  no     Greater than 50%  of this time was spent counseling and coordinating care related to the above assessment and plan.     AVinie Sill NP Palliative Medicine Team Pager # [phone](M-F 8a-5p) Team Phone # [phone](Nights/Weekends)  02/27/2015, 3:20 PM

## 2015-02-27 NOTE — Progress Notes (Signed)
Progress Note   [name] FTN:[id number] DOB: 1932/03/27 DOA: 02/23/2015 PCP: Reymundo Poll, MD   Brief Narrative:   [name] is an 81 y.o. female with a PMH of PAF, bradycardia and syncope status post pacemaker (not on anticoagulation secondary to high fall risk), moderately obstructive CAD with normal LV function, diet controlled type 2 diabetes, hypertension, limited stage small cell lung cancer status post radiation and chemotherapy, frequent falls and stage III chronic kidney disease who was admitted 02/23/15 secondary to choking on her food. EGD done at bedside revealed food impaction which was disimpacted. She was also found to be in atrial fibrillation with RVR necessitating treatment with a Cardizem drip. Chest x-ray done on admission showed diffuse interstitial opacities with possible aspiration pneumonia. She subsequently developed respiratory failure and was transferred to the SDU requiring BiPAP. A palliative care consultation was subsequently requested given her overall poor prognosis and multiple comorbidities.  Assessment/Plan:   Principal Problem: Esophageal foreign body / Peptic stricture of esophagus - S/P EGD with findings of food impaction in esophagus at UES s/p disimpaction 02/24/15. - SLP evaluation most recently 02/25/15 - nectar thick liquids recommended. - Continue Aspiration precautions.  - Continue protonix 40 mg IV Q 12 hours.  Active Problems: Acute respiratory failure with hypoxia (HCC) / Aspiration pneumonia (HCC) / Leukocytosis - Status post CXR 02/25/15 concerning for interstitial edema and right lower lobe pneumonia concerning for aspiration. - Diuresed. I/O balance -625 mL/24 hours. - Remains on zosyn for aspiration pneumonia, which was started on 02/25/15. - Initially required BiPAP, now on nasal cannula oxygen with no plans to resume BiPAP. - Continue xopenex and atrovent every 4 hours as needed for shortness of breath or wheezing.    Paroxysmal Atrial fibrillation with RVR (HCC) / Pacemaker-MDT dual  - CHADS vasc score at least 3 (age, htn, vascular). - Rate initially controlled with Cardizem drip; changed to metoprolol 2.5 mg IV every 6 hours. - D/C standing order for metoprolol (OK to continue PRN) and resume Coreg. - Not on AC due to dementia, risk of falls. - Continue daily aspirin.  - Has been intermittently tachycardic on tele. Pacemaker in place. - Palliative care team met with family 02/26/15 with plans to move toward comfort care.   Acute on chronic diastolic CHF - Last 2 D ECHO in 2008 with preserved EF. - Has pacemaker in place. - BNP on this admission 2112. - She has been diuresed, net negative I/O balance 350 mL.  Coronary atherosclerosis of native coronary artery / Elevated troponin / demand ischemia - Likely demand ischemia from acute decompensated diastolic CHF.  - Trop as high as 0.87 but leveled down to 0.54. - She is not a candidate for aggressive cardiac work up due to dementia and multiple other comorbidities. - Palliative care met with patient's family 02/26/15 to discuss goals of care.   Elevated D dimer - Although development of PE most certainly a possibility since pt has A fib not on AC other than aspirin. - She is not a candidate for [facility] due to dementia and risk of falls. - At this time, palliative care following with plans to move towards full comfort measures.   Depression / Dementia  - Continue donepezil and celexa.   Essential hypertension, benign - On low dose metoprolol 2.5 mg IV Q 6 hours.  Resume Coreg.  Dyslipidemia - Continue statin therapy.   Anemia of chronic disease - Secondary to CKD. - Hgb stable.  CKD (chronic kidney disease) stage 3, GFR 30-59 ml/min - Baseline Cr about 9 months age 50.3. - Creatinine on this admission within baseline range.   Hypomagnesemia - Supplemented.   Small cell carcinoma of lung (HCC) - Remote history, limited  stage. - Has followed with Dr. Marin Olp.  MRSA carrier - Decontamination therapy ordered.  DVT Prophylaxis  - Lovenox ordered.   Code Status: DNR/DNI Family Communication: Family not at the bedside this morning.  [name] (daughter) called at [phone] & updated by telephone. Disposition Plan: Return to ALF with hospice services vs SNF in 24-48 hours depending on PT evaluation.   IV Access:    Peripheral IV   Procedures and diagnostic studies:   Ct Head Wo Contrast  02/24/2015  CLINICAL DATA:  Food impaction in the esophagus with endoscopy earlier. Probable peptic stricture in the distal esophagus. Hiatal hernia. Change in condition during medication application. EXAM: CT HEAD WITHOUT CONTRAST TECHNIQUE: Contiguous axial images were obtained from the base of the skull through the vertex without intravenous contrast. COMPARISON:  10/30/2013 FINDINGS: Diffuse cerebral atrophy. Ventricular dilatation likely representing central atrophy. Diffuse low attenuation change throughout the deep white matter consistent with small vessel ischemia. No change in appearance since previous studies. No mass effect or midline shift. No abnormal extra-axial fluid collections. Gray-white matter junctions are distinct. Basal cisterns are not effaced. No evidence of acute intracranial hemorrhage. No depressed skull fractures. Visualized paranasal sinuses and mastoid air cells are not opacified. Vascular calcifications. IMPRESSION: No acute intracranial abnormalities. Chronic atrophy and small vessel ischemic changes. Electronically Signed   By: Lucienne Capers M.D.   On: 02/24/2015 05:09   Dg Chest Port 1 View  02/25/2015  CLINICAL DATA:  Dyspnea.  Aspiration and respiratory failure. EXAM: PORTABLE CHEST 1 VIEW COMPARISON:  02/23/2015 FINDINGS: Dual lead right-sided pacemaker remains in place. Right apical opacity with volume loss, unchanged from prior exam. There is developing patchy opacity at the right  lung base. Increasing interstitial opacities concerning for developing pulmonary edema. Calcified granuloma are again seen. Blunting of both costophrenic angles, may reflect small effusions. Cardiomediastinal contours are unchanged. IMPRESSION: 1. Development of diffuse interstitial opacities, concerning for pulmonary edema. Question small pleural effusions. 2. Ill-defined patchy opacity at the right lung base, may be vascular, atelectasis, or aspiration. Electronically Signed   By: Jeb Levering M.D.   On: 02/25/2015 00:28   Dg Chest Port 1 View  02/23/2015  CLINICAL DATA:  Patient complains of choking on chicken. Large amount of saliva. No actual vomiting. History of atrial fibrillation, type 2 diabetes, small cell carcinoma of lung, sick sinus syndrome. Former smoker quit in 1984. EXAM: PORTABLE CHEST 1 VIEW COMPARISON:  07/12/2011 FINDINGS: Cardiac pacemaker. Shallow inspiration. Volume loss in the right lung with opacity in the right apex probably postoperative given the history of lung cancer. No change in appearance since the previous study. Large calcified granuloma in the left upper lung with calcified lymph node in the left aortopulmonic window region. No focal airspace disease or consolidation in the lungs. Mediastinal contours are unchanged since previous study. IMPRESSION: No evidence of active pulmonary disease. No change since prior study. Right apical opacity with volume loss in the right lung likely postoperative. Large calcified granulomas. Electronically Signed   By: Lucienne Capers M.D.   On: 02/23/2015 21:53     Medical Consultants:    Gastroenterology: Mauri Pole, MD  Palliative care: Pershing Proud, NP  Anti-Infectives:   Anti-infectives    Start  Dose/Rate Route Frequency Ordered Stop   02/25/15 0115  piperacillin-tazobactam (ZOSYN) IVPB 3.375 g     3.375 g 12.5 mL/hr over 240 Minutes Intravenous 3 times per day 02/25/15 0106        Subjective:    [name] is awake and alert.  She tells me she doesn't feel well, feels weak, but denies pain/SOB, N/V.  Objective:    Filed Vitals:   02/27/15 0200 02/27/15 0400 02/27/15 0500 02/27/15 0600  BP: 133/62 125/62  117/64  Pulse: 95 96  117  Temp:  97.6 F (36.4 C)    TempSrc:  Axillary    Resp: '15 16  29  ' Height:      Weight:   55.6 kg (122 lb 9.2 oz)   SpO2: 100% 100%  99%    Intake/Output Summary (Last 24 hours) at 02/27/15 0709 Last data filed at 02/27/15 0510  Gross per 24 hour  Intake    200 ml  Output    825 ml  Net   -625 ml   Filed Weights   02/24/15 0800 02/27/15 0500  Weight: 55.6 kg (122 lb 9.2 oz) 55.6 kg (122 lb 9.2 oz)    Exam: Gen:  NAD, awake/alert Cardiovascular:  Heart sounds tachy/reg, No M/R/G Respiratory:  Lungs CTAB Gastrointestinal:  Abdomen soft, NT/ND, + BS Extremities:  No C/E/C   Data Reviewed:    Labs: Basic Metabolic Panel:  Recent Labs Lab 02/23/15 2144 02/24/15 0138 02/24/15 0714 02/24/15 2317 02/25/15 0031  NA 139  --  140 139  --   K 4.1  --  4.0 4.4  --   CL 105  --  110 111  --   CO2 25  --  21* 15*  --   GLUCOSE 129*  --  98 212*  --   BUN 19  --  16 15  --   CREATININE 1.24*  --  0.94 1.04*  --   CALCIUM 9.5  --  7.9* 8.1*  --   MG  --  1.3* 1.7  --  1.4*   GFR Estimated Creatinine Clearance: 37.2 mL/min (by C-G formula based on Cr of 1.04). Liver Function Tests:  Recent Labs Lab 02/24/15 0714  AST 29  ALT 9*  ALKPHOS 72  BILITOT 0.9  PROT 6.4*  ALBUMIN 3.1*   CBC:  Recent Labs Lab 02/23/15 2144 02/24/15 0714 02/24/15 2317  WBC 10.7* 8.7 17.8*  NEUTROABS 7.7 7.0  --   HGB 13.9 11.4* 12.7  HCT 41.9 36.1 39.6  MCV 88.0 91.9 90.6  PLT 215 134* 221   Cardiac Enzymes:  Recent Labs Lab 02/24/15 0138 02/24/15 0714 02/24/15 1232 02/24/15 1811 02/25/15 0031  TROPONINI 0.16* 0.48* 0.87* 0.66* 0.54*   D-Dimer:  Recent Labs  02/24/15 2317  DDIMER 0.91*   Microbiology Recent  Results (from the past 240 hour(s))  MRSA PCR Screening     Status: Abnormal   Collection Time: 02/24/15  2:39 AM  Result Value Ref Range Status   MRSA by PCR POSITIVE (A) NEGATIVE Final    Comment:        The GeneXpert MRSA Assay (FDA approved for NASAL specimens only), is one component of a comprehensive MRSA colonization surveillance program. It is not intended to diagnose MRSA infection nor to guide or monitor treatment for MRSA infections. RESULT CALLED TO, READ BACK BY AND VERIFIED WITH: DENNY,C/2W '@0546'  ON 02/24/15 BY KARCZEWSKI,S.      Medications:   . albumin  human  12.5 g Intravenous Once  . antiseptic oral rinse  7 mL Mouth Rinse BID  . aspirin EC  81 mg Oral Daily  . calcium gluconate  1 g Intravenous Once  . Chlorhexidine Gluconate Cloth  6 each Topical Daily  . enoxaparin (LOVENOX) injection  40 mg Subcutaneous Q24H  . feeding supplement (ENSURE ENLIVE)  237 mL Oral BID BM  . metoprolol  2.5 mg Intravenous 4 times per day  . mupirocin ointment  1 application Nasal BID  . pantoprazole (PROTONIX) IV  40 mg Intravenous Q12H  . piperacillin-tazobactam (ZOSYN)  IV  3.375 g Intravenous 3 times per day  . sodium chloride  3 mL Intravenous Q12H   Continuous Infusions: . sodium chloride 10 mL/hr at 02/27/15 0510    Time spent: 35 minutes.  The patient is medically complex and requires high complexity decision making and coordination of care with multiple specialists.    LOS: 3 days   [place] [facility] Pager [phone]. If unable to reach me by pager, please call my cell phone at [phone].  *Please refer to [email or web address], password [email or web address] to get updated schedule on who will round on this patient, as hospitalists switch teams weekly. If 7PM-7AM, please contact night-coverage at [email or web address], password [email or web address] for any overnight needs.  02/27/2015, 7:09 AM

## 2015-02-27 NOTE — Progress Notes (Signed)
Removed patient oxygen and O2 stat was 96% on RA.

## 2015-02-27 NOTE — Clinical Social Work Note (Signed)
Clinical Social Work Assessment  Patient Details  Name: [name] MRN: [id number] Date of Birth: 16-Jul-1931  Date of referral:  02/27/15               Reason for consult:  Discharge Planning                Permission sought to share information with:  Family Supports, Customer service manager Permission granted to share information::  No  Name::      (daughter at bedside)  Agency::     Relationship::     Contact Information:     Housing/Transportation Living arrangements for the past 2 months:  [place] [facility] of Information:  Adult Children Patient Interpreter Needed:  None Criminal Activity/Legal Involvement Pertinent to Current Situation/Hospitalization:  No - Comment as needed Significant Relationships:  Adult Children Lives with:  Facility Resident Do you feel safe going back to the place where you live?  Yes Need for family participation in patient care:  Yes (Comment)  Care giving concerns:  Patient admitted from [place] where she has lived about 6 years. Per daughter, they are hopeful she can return at dc with hospice care.    Social Worker assessment / plan:  CSW has faxed clinical info to ALF and will await word from their Director on tomorrow about her returning- they are optimistic that she can return with Hospice care as they have others under Hospice there already.  Employment status:  Retired Forensic scientist:    PT Recommendations:  [place] / Referral to community resources:     Patient/Family's Response to care:  Daughter hopeful for return to ALF with hospice care. She is appreciative of the care received.   Patient/Family's Understanding of and Emotional Response to Diagnosis, Current Treatment, and Prognosis:  Daughter acknowledges her mother's need for hospice care and is hoping she can return the ALF where she has resided for the past 6 years. "It's become her home".  Will await final  word form ALF and advise.  Emotional Assessment Appearance:  Appears stated age Attitude/Demeanor/Rapport:  Unable to Assess Affect (typically observed):  Withdrawn Orientation:    Alcohol / Substance use:  Not Applicable Psych involvement (Current and /or in the community):  No (Comment)  Discharge Needs  Concerns to be addressed:    Readmission within the last 30 days:  No Current discharge risk:  None Barriers to Discharge:  No Barriers Identified   Ludwig Clarks, LCSW 02/27/2015, 3:32 PM

## 2015-02-27 NOTE — Progress Notes (Signed)
Paged MD to make aware that pt has had 3 reported loose stools this shift, 2 since 3pm

## 2015-02-27 NOTE — Progress Notes (Signed)
Paged MD Juluis Rainier) to make aware of pt elevated heart, afib on telemetry monitor and prn metoprolol given

## 2015-02-28 DIAGNOSIS — I2510 Atherosclerotic heart disease of native coronary artery without angina pectoris: Secondary | ICD-10-CM

## 2015-02-28 LAB — BASIC METABOLIC PANEL
ANION GAP: 13 (ref 5–15)
BUN: 28 mg/dL — AB (ref 6–20)
CALCIUM: 9 mg/dL (ref 8.9–10.3)
CO2: 24 mmol/L (ref 22–32)
Chloride: 103 mmol/L (ref 101–111)
Creatinine, Ser: 1.03 mg/dL — ABNORMAL HIGH (ref 0.44–1.00)
GFR calc Af Amer: 57 mL/min — ABNORMAL LOW (ref >60)
GFR, EST NON AFRICAN AMERICAN: 50 mL/min — AB (ref >60)
GLUCOSE: 153 mg/dL — AB (ref 65–99)
Potassium: 4.2 mmol/L (ref 3.5–5.1)
Sodium: 140 mmol/L (ref 135–145)

## 2015-02-28 LAB — CBC
HEMATOCRIT: 34.4 % — AB (ref 36.0–46.0)
Hemoglobin: 11.1 g/dL — ABNORMAL LOW (ref 12.0–15.0)
MCH: 28.8 pg (ref 26.0–34.0)
MCHC: 32.3 g/dL (ref 30.0–36.0)
MCV: 89.4 fL (ref 78.0–100.0)
PLATELETS: 186 10*3/uL (ref 150–400)
RBC: 3.85 MIL/uL — ABNORMAL LOW (ref 3.87–5.11)
RDW: 14.3 % (ref 11.5–15.5)
WBC: 9.5 10*3/uL (ref 4.0–10.5)

## 2015-02-28 MED ORDER — LORAZEPAM 2 MG/ML IJ SOLN: 0.5000 mg | INTRAMUSCULAR | Status: DC | PRN

## 2015-02-28 MED ORDER — DILTIAZEM HCL ER COATED BEADS 120 MG PO CP24
120.0000 mg | ORAL_CAPSULE | Freq: Every day | ORAL | Status: DC
  Administered 2015-02-28: 120 mg via ORAL
  Filled 2015-02-28: qty 1

## 2015-02-28 MED ORDER — MORPHINE SULFATE (CONCENTRATE) 10 MG/0.5ML PO SOLN
5.0000 mg | ORAL | Status: DC | PRN
  Administered 2015-02-28 – 2015-03-01 (×4): 5 mg via ORAL
  Filled 2015-02-28 (×4): qty 0.5

## 2015-02-28 MED ORDER — GLYCOPYRROLATE 0.2 MG/ML IJ SOLN
0.2000 mg | INTRAMUSCULAR | Status: DC | PRN
  Filled 2015-02-28: qty 1

## 2015-02-28 MED ORDER — FUROSEMIDE 10 MG/ML IJ SOLN
40.0000 mg | Freq: Once | INTRAMUSCULAR | Status: AC
  Administered 2015-02-28: 40 mg via INTRAVENOUS
  Filled 2015-02-28: qty 4

## 2015-02-28 NOTE — Care Management Important Message (Signed)
Important Message  Patient Details  Name: [name] MRN: [id number] Date of Birth: 05/03/1932   Medicare Important Message Given:  Yes-second notification given    Camillo Flaming 02/28/2015, 11:13 AMImportant Message  Patient Details  Name: [name] MRN: [id number] Date of Birth: 1931-12-23   Medicare Important Message Given:  Yes-second notification given    Camillo Flaming 02/28/2015, 11:13 AM

## 2015-02-28 NOTE — Progress Notes (Signed)
This RN agrees with previous Psychiatrist.

## 2015-02-28 NOTE — Progress Notes (Signed)
ANTIBIOTIC CONSULT NOTE - FOLLOW UP  Pharmacy Consult for zosyn Indication: pneumonia  No Known Allergies  Patient Measurements: Height: '5\' 5"'$  (165.1 cm) Weight: 127 lb 3.3 oz (57.7 kg) IBW/kg (Calculated) : 57  Vital Signs: Temp: 97 F (36.1 C) (10/20 5726) Temp Source: Oral (10/20 2035) BP: 111/81 mmHg (10/20 5974) Pulse Rate: 88 (10/20 0633) Intake/Output from previous day: 10/19 0701 - 10/20 0700 In: 680 [P.O.:480; I.V.:50; IV Piggyback:150] Out: 3 [Urine:2; Stool:1] Intake/Output from this shift:    Labs:  Recent Labs  02/28/15 0545  WBC 9.5  HGB 11.1*  PLT 186  CREATININE 1.03*   Estimated Creatinine Clearance: 38.5 mL/min (by C-G formula based on Cr of 1.03). No results for input(s): VANCOTROUGH, VANCOPEAK, VANCORANDOM, GENTTROUGH, GENTPEAK, GENTRANDOM, TOBRATROUGH, TOBRAPEAK, TOBRARND, AMIKACINPEAK, AMIKACINTROU, AMIKACIN in the last 72 hours.   Microbiology: Recent Results (from the past 720 hour(s))  MRSA PCR Screening     Status: Abnormal   Collection Time: 02/24/15  2:39 AM  Result Value Ref Range Status   MRSA by PCR POSITIVE (A) NEGATIVE Final    Comment:        The GeneXpert MRSA Assay (FDA approved for NASAL specimens only), is one component of a comprehensive MRSA colonization surveillance program. It is not intended to diagnose MRSA infection nor to guide or monitor treatment for MRSA infections. RESULT CALLED TO, READ BACK BY AND VERIFIED WITH: DENNY,C/2W '@0546'$  ON 02/24/15 BY KARCZEWSKI,S.     Anti-infectives    Start     Dose/Rate Route Frequency Ordered Stop   02/25/15 0115  piperacillin-tazobactam (ZOSYN) IVPB 3.375 g     3.375 g 12.5 mL/hr over 240 Minutes Intravenous 3 times per day 02/25/15 0106        Assessment: Patient's an 81 y.o F currently on zosyn day #4 for suspected aspiration PNA.  She remains afebrile, wbc wnl, and scr 1.03 (crcl~38).  No cultures. Plan is for ALF with hospice.  Plan:  - continue zosyn 3.375 gm  IV q8h (infuse over 4 hours) - please indicate plan/LOT for abx  Braxtyn Dorff P 02/28/2015,9:38 AM

## 2015-02-28 NOTE — Progress Notes (Signed)
Daily Progress Note   Patient Name: [name]       Date: 02/28/2015 DOB: Jul 05, 1931  Age: 81 y.o. MRN#: [id number] Attending Physician: Venetia Maxon Rama, MD Primary Care Physician: Reymundo Poll, MD Admit Date: 02/23/2015  Reason for Consultation/Follow-up: GOC  Subjective:     [name] appears very declined today with fever, labored breathing, lethargy following agitation/restlessness, cold extremities. I discussed with daughter [name] at bedside my findings and that I believe she is now at end of life. Debbie agrees to full comfort care at this time. Debbie's sister Marlowe Kays calls and we discuss over speaker phone and Marlowe Kays agrees with decision for full comfort care. We also discussed potential for hospice facility although I do not believe she is stable for transfer at this time. All agree to reassess in the morning and if more stabilized and comfortable then will consider transfer to hospice facility if appropriate. Emotional support provided.    Length of Stay: 4 days  Current Medications: Scheduled Meds:  . antiseptic oral rinse  7 mL Mouth Rinse BID  . Chlorhexidine Gluconate Cloth  6 each Topical Daily  . citalopram  20 mg Oral QHS  . mupirocin ointment  1 application Nasal BID  . sodium chloride  3 mL Intravenous Q12H    Continuous Infusions: . sodium chloride 10 mL/hr at 02/27/15 0510    PRN Meds: acetaminophen **OR** acetaminophen, ALPRAZolam, glycopyrrolate, ipratropium, levalbuterol, LORazepam, morphine CONCENTRATE, ondansetron **OR** ondansetron (ZOFRAN) IV  Palliative Performance Scale: 10%     Vital Signs: BP 88/63 mmHg  Pulse 90  Temp(Src) 100.1 F (37.8 C) (Rectal)  Resp 24  Ht '5\' 5"'$  (1.651 m)  Wt 57.7 kg (127 lb 3.3 oz)  BMI 21.17 kg/m2  SpO2 98% SpO2: SpO2: 98 % O2 Device: O2 Device: Nasal Cannula O2 Flow Rate: O2 Flow Rate (L/min): 2 L/min  Intake/output summary:   Intake/Output Summary (Last 24 hours) at 02/28/15 2123 Last data filed  at 02/28/15 5638  Gross per 24 hour  Intake     53 ml  Output      2 ml  Net     51 ml   LBM: Last BM Date: 02/27/15 Baseline Weight: Weight: 55.6 kg (122 lb 9.2 oz) Most recent weight: Weight: 57.7 kg (127 lb 3.3 oz)  Physical Exam: General: Appears to be actively dying, lying in bed, pallor HEENT: [place]/AT CVS: Irreg - atrial fib Resp: Labored breathing Abd: Soft, NT, ND Neuro: Lethargic, confused   Additional Data Reviewed: Recent Labs     02/28/15  0545  WBC  9.5  HGB  11.1*  PLT  186  NA  140  BUN  28*  CREATININE  1.03*     Problem List:  Patient Active Problem List   Diagnosis Date Noted  . Demand ischemia ([place]) 02/27/2015  . PAF (paroxysmal atrial fibrillation) ([place]) 02/26/2015  . MRSA carrier 02/26/2015  . Palliative care encounter   . SOB (shortness of breath)   . Acute respiratory failure with hypoxia ([place]) 02/25/2015  . Aspiration pneumonia ([place]) 02/25/2015  . Leukocytosis 02/25/2015  . CKD (chronic kidney disease) stage 3, GFR 30-59 ml/min 02/25/2015  . Acute on chronic diastolic CHF (congestive heart failure) ([place]) 02/25/2015  . Dyslipidemia 02/25/2015  . Dementia without behavioral disturbance 02/25/2015  . Hypomagnesemia 02/25/2015  . Atrial fibrillation with RVR ([place]) 02/24/2015  . Elevated troponin 02/24/2015  . Anemia of chronic disease 02/24/2015  . Esophageal foreign body   . Peptic  stricture of esophagus   . Small cell carcinoma of lung ([place]) 12/30/2012  . Essential hypertension, benign 09/22/2012  . Coronary atherosclerosis of native coronary artery 09/22/2012  . Pacemaker-MDT dual 07/14/2011  . Depression 12/22/2006     Palliative Care Assessment & Plan    Code Status:  DNR  Goals of Care:  Full comfort care.   3. Symptom Management:  Anxiety: Xanax prn or if unable to take po may utilize ativan IV prn.   Pain/dyspnea: Roxanol prn.   Secretions: Robinul prn.    5. Prognosis: Hours to days.   5. Discharge Planning:  Hospital death vs hospice facility.    Thank you for allowing the Palliative Medicine Team to assist in the care of this patient.   Time In: 1110 Time Out: 1150 Total Time 67mn Prolonged Time Billed  no     Greater than 50%  of this time was spent counseling and coordinating care related to the above assessment and plan.     AVinie Sill NP Palliative Medicine Team Pager # [phone](M-F 8a-5p) Team Phone # [phone](Nights/Weekends)  02/28/2015, 9:23 PM

## 2015-02-28 NOTE — Care Management Note (Signed)
Case Management Note  Patient Details  Name: JEARLENE BRIDWELL MRN: [id number] Date of Birth: 1931/06/05  Subjective/Objective: Palliative Med Team-comfort care-DNR/DNI.CSW following-Residential hospice.                   Action/Plan:d/c plan Residential Hospice.   Expected Discharge Date:                  Expected Discharge Plan:  [place]  In-House Referral:  Clinical Social Work  Discharge planning Services  CM Consult  Post Acute Care Choice:  NA Choice offered to:  NA  DME Arranged:  Apnea monitor DME Agency:  NA  HH Arranged:    [place] Agency:  NA  Status of Service:  In process, will continue to follow  Medicare Important Message Given:  Yes-second notification given Date Medicare IM Given:    Medicare IM give by:    Date Additional Medicare IM Given:    Additional Medicare Important Message give by:     If discussed at [place] of Stay Meetings, dates discussed:    Additional Comments:  Dessa Phi, RN 02/28/2015, 3:06 PM

## 2015-02-28 NOTE — Progress Notes (Signed)
Paged MD to make aware of pt status: diaphoretic, lung with wheezing and rhonchi noted, Sat 94% 2L, temp 100.1 rectal, HR up to 165 non-sustained

## 2015-02-28 NOTE — Progress Notes (Signed)
Progress Note   [name] NPY:[id number] DOB: November 13, 1931 DOA: 02/23/2015 PCP: Reymundo Poll, MD   Brief Narrative:   [name] is an 81 y.o. female with a PMH of PAF, bradycardia and syncope status post pacemaker (not on anticoagulation secondary to high fall risk), moderately obstructive CAD with normal LV function, diet controlled type 2 diabetes, hypertension, limited stage small cell lung cancer status post radiation and chemotherapy, frequent falls and stage III chronic kidney disease who was admitted 02/23/15 secondary to choking on her food. EGD done at bedside revealed food impaction which was disimpacted. She was also found to be in atrial fibrillation with RVR necessitating treatment with a Cardizem drip. Chest x-ray done on admission showed diffuse interstitial opacities with possible aspiration pneumonia. She subsequently developed respiratory failure and was transferred to the SDU requiring BiPAP. A palliative care consultation was subsequently requested given her overall poor prognosis and multiple comorbidities.  Assessment/Plan:   Principal Problems: Esophageal foreign body / Peptic stricture of esophagus - S/P EGD with findings of food impaction in esophagus at UES s/p disimpaction 02/24/15. - SLP evaluation most recently 02/25/15 - nectar thick liquids recommended. - Continue Aspiration precautions.  - Continue protonix 40 mg IV Q 12 hours.  Acute respiratory failure with hypoxia (HCC) / Aspiration pneumonia (HCC) / Leukocytosis - Status post CXR 02/25/15 concerning for interstitial edema and right lower lobe pneumonia concerning for aspiration. - Will give Lasix 40 mg IV x 1 now secondary to respiratory distress, increased WOB. - Treated with 02/25/15-02/28/15, now on full comfort measures. - Initially required BiPAP, now on nasal cannula oxygen with no plans to resume BiPAP. - Continue xopenex and atrovent every 4 hours as needed for shortness of breath or  wheezing.   Paroxysmal Atrial fibrillation with RVR (HCC) / Pacemaker-MDT dual  - Rate initially controlled with Cardizem drip. - All rate controlling meds D/C'd 02/28/15 in move toward comfort care. - Not on AC due to dementia, risk of falls.  Aspirin D/C'd 02/28/15.  - D/C telemetry. - Palliative care team met with family 02/26/15, now receiving full comfort care.   Acute on chronic diastolic CHF - Last 2 D ECHO in 2008 with preserved EF. - Has pacemaker in place. - BNP on this admission 2112. - Give 1 dose of Lasix today for increased WOB.  Coronary atherosclerosis of native coronary artery / Elevated troponin / demand ischemia - Likely demand ischemia from acute decompensated diastolic CHF.  - Trop as high as 0.87 but leveled down to 0.54. - She is not a candidate for aggressive cardiac work up due to dementia and multiple other comorbidities. - Palliative care met with patient's family 02/26/15 to discuss goals of care.   Elevated D dimer - No work up initiated, now on full comfort measures.   Active Problems: Depression / Dementia  - D/C donepezil and celexa.   Essential hypertension, benign - D/C Coreg.  Dyslipidemia - D/C statin therapy.   Anemia of chronic disease - Secondary to CKD. - Hgb stable. No further lab draws.  CKD (chronic kidney disease) stage 3, GFR 30-59 ml/min - Baseline Cr about 9 months age 25.3. - Creatinine on this admission within baseline range.   Hypomagnesemia - Supplemented.   Small cell carcinoma of lung (HCC) - Remote history, limited stage. - Has followed with Dr. Marin Olp.  MRSA carrier - Decontamination therapy ordered.  DVT Prophylaxis  - Lovenox ordered.   Code Status: DNR/DNI Family Communication: [name] (daughter)  updated at bedside along with Elmo Putt from palliative care. Disposition Plan: Residential hospice tomorrow if medically stable for transfer and a bed is available.   IV Access:    Peripheral  IV   Procedures and diagnostic studies:   Dg Chest Port 1 View  02/26/2015  CLINICAL DATA:  Dyspnea.  Aspiration and respiratory failure. EXAM: PORTABLE CHEST 1 VIEW COMPARISON:  02/23/2015 FINDINGS: Dual lead right-sided pacemaker remains in place. Right apical opacity with volume loss, unchanged from prior exam. There is developing patchy opacity at the right lung base. Increasing interstitial opacities concerning for developing pulmonary edema. Calcified granuloma are again seen. Blunting of both costophrenic angles, may reflect small effusions. Cardiomediastinal contours are unchanged. IMPRESSION: 1. Development of diffuse interstitial opacities, concerning for pulmonary edema. Question small pleural effusions. 2. Ill-defined patchy opacity at the right lung base, may be vascular, atelectasis, or aspiration. Electronically Signed   By: Jeb Levering M.D.   On: 02/26/2015 00:28     Medical Consultants:    Gastroenterology: Mauri Pole, MD  Palliative care: Pershing Proud, NP  Anti-Infectives:   Anti-infectives    Start     Dose/Rate Route Frequency Ordered Stop   March 03, 2015 0115  piperacillin-tazobactam (ZOSYN) IVPB 3.375 g     3.375 g 12.5 mL/hr over 240 Minutes Intravenous 3 times per day 03-Mar-2015 0106        Subjective:   [name] was agitated, with increased dyspnea and WOB.  No reports of pain.  Calmed after being given Lasix and PRN meds.  Objective:    Filed Vitals:   02/27/15 1300 02/27/15 1353 02/27/15 2202 02/28/15 0633  BP:  104/80 124/70 111/81  Pulse:  115 110 88  Temp:  98.2 F (36.8 C) 98 F (36.7 C) 97 F (36.1 C)  TempSrc:  Oral Oral Oral  Resp:  '20 22 20  ' Height:      Weight:      SpO2: 96% 100% 100% 99%    Intake/Output Summary (Last 24 hours) at 02/28/15 0729 Last data filed at 02/28/15 0635  Gross per 24 hour  Intake    680 ml  Output      3 ml  Net    677 ml   Filed Weights   02/24/15 0800 02/27/15 0500 02/27/15 1122   Weight: 55.6 kg (122 lb 9.2 oz) 55.6 kg (122 lb 9.2 oz) 57.7 kg (127 lb 3.3 oz)    Exam: Gen:  Restless Cardiovascular:  Heart sounds tachy/reg, No M/R/G Respiratory:  Lungs with a few rhonchi/course, increased WOB Gastrointestinal:  Abdomen soft, NT/ND, + BS Extremities:  No C/E/C   Data Reviewed:    Labs: Basic Metabolic Panel:  Recent Labs Lab 02/23/15 2144 02/24/15 0138 02/24/15 0714 02/24/15 2317 02/12/2015 0031 02/28/15 0545  NA 139  --  140 139  --  140  K 4.1  --  4.0 4.4  --  4.2  CL 105  --  110 111  --  103  CO2 25  --  21* 15*  --  24  GLUCOSE 129*  --  98 212*  --  153*  BUN 19  --  16 15  --  28*  CREATININE 1.24*  --  0.94 1.04*  --  1.03*  CALCIUM 9.5  --  7.9* 8.1*  --  9.0  MG  --  1.3* 1.7  --  1.4*  --    GFR Estimated Creatinine Clearance: 38.5 mL/min (by C-G formula  based on Cr of 1.03). Liver Function Tests:  Recent Labs Lab 02/24/15 0714  AST 29  ALT 9*  ALKPHOS 72  BILITOT 0.9  PROT 6.4*  ALBUMIN 3.1*   CBC:  Recent Labs Lab 02/23/15 2144 02/24/15 0714 02/24/15 2317 02/28/15 0545  WBC 10.7* 8.7 17.8* 9.5  NEUTROABS 7.7 7.0  --   --   HGB 13.9 11.4* 12.7 11.1*  HCT 41.9 36.1 39.6 34.4*  MCV 88.0 91.9 90.6 89.4  PLT 215 134* 221 186   Cardiac Enzymes:  Recent Labs Lab 02/24/15 0138 02/24/15 0714 02/24/15 1232 02/24/15 1811 02/25/15 0031  TROPONINI 0.16* 0.48* 0.87* 0.66* 0.54*   D-Dimer: No results for input(s): DDIMER in the last 72 hours. Microbiology Recent Results (from the past 240 hour(s))  MRSA PCR Screening     Status: Abnormal   Collection Time: 02/24/15  2:39 AM  Result Value Ref Range Status   MRSA by PCR POSITIVE (A) NEGATIVE Final    Comment:        The GeneXpert MRSA Assay (FDA approved for NASAL specimens only), is one component of a comprehensive MRSA colonization surveillance program. It is not intended to diagnose MRSA infection nor to guide or monitor treatment for MRSA  infections. RESULT CALLED TO, READ BACK BY AND VERIFIED WITH: DENNY,C/2W '@0546'  ON 02/24/15 BY KARCZEWSKI,S.      Medications:   . antiseptic oral rinse  7 mL Mouth Rinse BID  . aspirin EC  81 mg Oral Daily  . calcium gluconate  1 g Intravenous Once  . carvedilol  6.25 mg Oral BID WC  . Chlorhexidine Gluconate Cloth  6 each Topical Daily  . citalopram  20 mg Oral QHS  . enoxaparin (LOVENOX) injection  40 mg Subcutaneous Q24H  . feeding supplement (ENSURE ENLIVE)  237 mL Oral BID BM  . mupirocin ointment  1 application Nasal BID  . pantoprazole (PROTONIX) IV  40 mg Intravenous Q24H  . piperacillin-tazobactam (ZOSYN)  IV  3.375 g Intravenous 3 times per day  . sodium chloride  3 mL Intravenous Q12H   Continuous Infusions: . sodium chloride 10 mL/hr at 02/27/15 0510    Time spent: 35 minutes with > 50% of time discussing current diagnostic test results, clinical impression and plan of care with the palliative care team and her daughter at the bedside.    LOS: 4 days   [facility] Pager [phone]. If unable to reach me by pager, please call my cell phone at [phone].  *Please refer to [email or web address], password [email or web address] to get updated schedule on who will round on this patient, as hospitalists switch teams weekly. If 7PM-7AM, please contact night-coverage at [email or web address], password [email or web address] for any overnight needs.  02/28/2015, 7:29 AM

## 2015-03-01 MED ORDER — MORPHINE SULFATE (CONCENTRATE) 10 MG/0.5ML PO SOLN: 5.0000 mg | ORAL | Status: AC | PRN

## 2015-03-01 MED ORDER — ALPRAZOLAM 0.25 MG PO TABS: 0.2500 mg | ORAL_TABLET | Freq: Three times a day (TID) | ORAL | Status: AC | PRN

## 2015-03-01 NOTE — Progress Notes (Signed)
[facility] Liaison: RN Visit °Received request from CSW, Jamie for family interest in [facility] with request for transfer today. Chart reviewed and received report from bedside nurse. Met with patient and family to confirm interest and explain services. °Family agreeable to transfer today., CSW aware. Registration paper work completed. Dr. Donald Hertweck to assume care per family request. Discharge summary has been faxed to [facility] and staff RN to call report to [phone].Please arrange transport. ° ° °Lisa Strandberg RN °[facility] Liaison °[phone] °

## 2015-03-01 NOTE — Progress Notes (Signed)
CSW met with pt / family this am to assist with d/c planning. Residential Hospice placement has been recommended. Family is in agreement with this plan and have requested [facility] for placement. [facility] has been notified and Lisa ( [phone] ) will contact CSW once a decision has been made regarding acceptance.    LCSW [phone] 

## 2015-03-01 NOTE — Care Management Note (Signed)
Case Management Note  Patient Details  Name: [name] MRN: [id number] Date of Birth: November 06, 1931  Subjective/Objective:                    Action/Plan:d/c to residential hospice.   Expected Discharge Date:                  Expected Discharge Plan:  [place]  In-House Referral:  Clinical Social Work  Discharge planning Services  CM Consult  Post Acute Care Choice:  NA Choice offered to:  NA  DME Arranged:  Apnea monitor DME Agency:  NA  HH Arranged:    HH Agency:  NA  Status of Service:  Completed, signed off  Medicare Important Message Given:  Yes-second notification given Date Medicare IM Given:    Medicare IM give by:    Date Additional Medicare IM Given:    Additional Medicare Important Message give by:     If discussed at [place] of Stay Meetings, dates discussed:    Additional Comments:  Dessa Phi, RN 03/01/2015, 10:55 AM

## 2015-03-01 NOTE — Discharge Instructions (Signed)
Aspiration Pneumonia °Aspiration pneumonia is an infection in your lungs. It occurs when food, liquid, or stomach contents (vomit) are inhaled (aspirated) into your lungs. When these things get into your lungs, swelling (inflammation) and infection can occur. This can make it difficult for you to breathe. Aspiration pneumonia is a serious condition and can be life threatening. °RISK FACTORS °Aspiration pneumonia is more likely to occur when a person's cough (gag) reflex or ability to swallow has been decreased. Some things that can do this include:  °· Having a brain injury or disease, such as stroke, seizures, Parkinson's disease, dementia, or amyotrophic lateral sclerosis (ALS).   °· Being given general anesthetic for procedures.   °· Being in a coma (unconscious).   °· Having a narrowing of the tube that carries food to the stomach (esophagus).   °· Drinking too much alcohol. If a person passes out and vomits, vomit can be swallowed into the lungs.   °· Taking certain medicines, such as tranquilizers or sedatives.   °SIGNS AND SYMPTOMS  °· Coughing after swallowing food or liquids.   °· Breathing problems, such as wheezing or shortness of breath.   °· Bluish skin. This can be caused by lack of oxygen.   °· Coughing up food or mucus. The mucus might contain blood, greenish material, or yellowish-white fluid (pus).   °· Fever.   °· Chest pain.   °· Being more tired than usual (fatigue).   °· Sweating more than usual.   °· Bad breath.   °DIAGNOSIS  °A physical exam will be done. During the exam, the health care provider will listen to your lungs with a stethoscope to check for:  °· Crackling sounds in the lungs. °· Decreased breath sounds. °· A rapid heartbeat. °Various tests may be ordered. These may include:  °· Chest X-ray.   °· CT scan.   °· Swallowing study. This test looks at how food is swallowed and whether it goes into your breathing tube (trachea) or food pipe (esophagus).   °· Sputum culture. Saliva and  mucus (sputum) are collected from the lungs or the tubes that carry air to the lungs (bronchi). The sputum is then tested for bacteria.   °· Bronchoscopy. This test uses a flexible tube (bronchoscope) to see inside the lungs. °TREATMENT  °Treatment will usually include antibiotic medicines. Other medicines may also be used to reduce fever or pain. You may need to be treated in the hospital. In the hospital, your breathing will be carefully monitored. Depending on how well you are breathing, you may need to be given oxygen, or you may need breathing support from a breathing machine (ventilator). For people who fail a swallowing study, a feeding tube might be placed in the stomach, or they may be asked to avoid certain food textures or liquids when they eat. °HOME CARE INSTRUCTIONS  °· Carefully follow any special eating instructions you were given, such as avoiding certain food textures or thickening liquids. This reduces the risk of developing aspiration pneumonia again.  °· Only take over-the-counter or prescription medicines as directed by your health care provider. Follow the directions carefully.   °· If you were prescribed antibiotics, take them as directed. Finish them even if you start to feel better.   °· Rest as instructed by your health care provider.   °· Keep all follow-up appointments with your health care provider.   °SEEK MEDICAL CARE IF:  °· You develop worsening shortness of breath, wheezing, or difficulty breathing.   °· You develop a fever.   °· You have chest pain.   °MAKE SURE YOU:  °· Understand these instructions. °· Will watch your   condition.  Will get help right away if you are not doing well or get worse.   This information is not intended to replace advice given to you by your health care provider. Make sure you discuss any questions you have with your health care provider.   Document Released: 02/22/2009 Document Revised: 05/02/2013 Document Reviewed: 10/13/2012 Elsevier  Interactive Patient Education [employer].

## 2015-03-01 NOTE — Discharge Summary (Signed)
Physician Discharge Summary  [name] BRA:[id number] DOB: 1932/01/20 DOA: 02/23/2015  PCP: Reymundo Poll, MD  Admit date: 02/23/2015 Discharge date: 03/01/2015   Recommendations for Outpatient Follow-Up:   1. The patient is being discharged to [facility] for end-of-life care.   Discharge Diagnosis:   Principal Problem:    Esophageal foreign body Active Problems:    Depression    Pacemaker-MDT dual    Essential hypertension, benign    Coronary atherosclerosis of native coronary artery    Small cell carcinoma of lung (HCC)    Peptic stricture of esophagus    Atrial fibrillation with RVR (HCC)    Elevated troponin    Anemia of chronic disease    Acute respiratory failure with hypoxia (HCC)    Aspiration pneumonia (HCC)    Leukocytosis    CKD (chronic kidney disease) stage 3, GFR 30-59 ml/min    Acute on chronic diastolic CHF (congestive heart failure) (HCC)    Dyslipidemia    Dementia without behavioral disturbance    Hypomagnesemia    PAF (paroxysmal atrial fibrillation) ([place])    MRSA carrier    Palliative care encounter    SOB (shortness of breath)    Demand ischemia [facility])   Discharge disposition:  Residential hospice [facility])  Discharge Condition: Terminal.  Diet recommendation: Comfort feeds as tolerated.   History of Present Illness:   [name] is an 81 y.o. female with a PMH of PAF, bradycardia and syncope status post pacemaker (not on anticoagulation secondary to high fall risk), moderately obstructive CAD with normal LV function, diet controlled type 2 diabetes, hypertension, limited stage small cell lung cancer status post radiation and chemotherapy, frequent falls and stage III chronic kidney disease who was admitted 02/23/15 secondary to choking on her food. EGD done at bedside revealed food impaction which was disimpacted. She was also found to be in atrial fibrillation with RVR necessitating treatment with a  Cardizem drip. Chest x-ray done on admission showed diffuse interstitial opacities with possible aspiration pneumonia. She subsequently developed respiratory failure and was transferred to the SDU requiring BiPAP. A palliative care consultation was subsequently requested given her overall poor prognosis and multiple comorbidities.   Hospital Course by Problem:   Principal Problems: Esophageal foreign body / Peptic stricture of esophagus - S/P EGD with findings of food impaction in esophagus at UES s/p disimpaction 02/24/15. - SLP evaluation most recently 02/25/15 - nectar thick liquids recommended. - Continue Aspiration precautions.  - The patient is now receiving comfort care with plans to discharge to a residential hospice facility.  Acute respiratory failure with hypoxia (HCC) / Aspiration pneumonia (HCC) / Leukocytosis - Status post CXR 02/25/15 concerning for interstitial edema and right lower lobe pneumonia concerning for aspiration. - Will give Lasix 40 mg IV x 1 now secondary to respiratory distress, increased WOB. - Treated with 02/25/15-02/28/15, now on full comfort measures. - Initially required BiPAP, now on nasal cannula oxygen with no plans to resume BiPAP. - Continue xopenex and atrovent every 4 hours as needed for shortness of breath or wheezing.   Paroxysmal Atrial fibrillation with RVR (HCC) / Pacemaker-MDT dual  - Rate initially controlled with Cardizem drip. All rate controlling meds D/C'd 02/28/15 in move toward comfort care. - Not on AC due to dementia, risk of falls. Aspirin D/C'd 02/28/15.  - Palliative care team met with family 02/26/15, now receiving full comfort care.   Acute on chronic diastolic CHF - Last 2 D ECHO in 2008  with preserved EF. Has pacemaker in place. - BNP on this admission 2112. - Give Lasix as needed for dyspnea/pulmonary edema.  Coronary atherosclerosis of native coronary artery / Elevated troponin / demand ischemia - Likely demand  ischemia from acute decompensated diastolic CHF.  - Trop as high as 0.87 but leveled down to 0.54. - She is not a candidate for aggressive cardiac work up due to dementia and multiple other comorbidities. - Palliative care met with patient's family 02/26/15 to discuss goals of care. We'll discharge to residential hospice.  Elevated D dimer - No work up initiated, now on full comfort measures.   Active Problems: Depression / Dementia  - Donepezil and celexa discontinued.   Essential hypertension, benign - Coreg discontinued.  Dyslipidemia - Statin therapy discontinued.   Anemia of chronic disease - Secondary to CKD. Hgb stable. No further lab draws.  CKD (chronic kidney disease) stage 3, GFR 30-59 ml/min - Baseline Cr about 1.3. No further lab checks.   Hypomagnesemia - Supplemented.   Small cell carcinoma of lung (HCC) - Remote history, limited stage. - Has followed with Dr. Marin Olp.  MRSA carrier - Decontamination therapy completed.    Medical Consultants:       Gastroenterology: Mauri Pole, MD  Palliative care: Pershing Proud, NP        Discharge Exam:   Filed Vitals:   03/01/15 0546  BP: 90/70  Pulse: 128  Temp: 97.4 F (36.3 C)  Resp: 20   Filed Vitals:   02/28/15 1000 02/28/15 1357 02/28/15 2317 03/01/15 0546  BP:  88/63 117/64 90/70  Pulse:  90 99 128  Temp: 100.1 F (37.8 C)  97.4 F (36.3 C) 97.4 F (36.3 C)  TempSrc: Rectal  Axillary Axillary  Resp:  '24 20 20  ' Height:      Weight:      SpO2:  98% 100% 100%    Gen: NAD Cardiovascular: Heart sounds tachy/reg, No M/R/G Respiratory: Lungs diminished and a bit clearer Gastrointestinal: Abdomen soft, NT/ND, + BS Extremities: No C/E/C   The results of significant diagnostics from this hospitalization (including imaging, microbiology, ancillary and laboratory) are listed below for reference.     Procedures and Diagnostic Studies:   Ct Head Wo  Contrast  03/06/15  CLINICAL DATA:  Food impaction in the esophagus with endoscopy earlier. Probable peptic stricture in the distal esophagus. Hiatal hernia. Change in condition during medication application. EXAM: CT HEAD WITHOUT CONTRAST TECHNIQUE: Contiguous axial images were obtained from the base of the skull through the vertex without intravenous contrast. COMPARISON:  10/30/2013 FINDINGS: Diffuse cerebral atrophy. Ventricular dilatation likely representing central atrophy. Diffuse low attenuation change throughout the deep white matter consistent with small vessel ischemia. No change in appearance since previous studies. No mass effect or midline shift. No abnormal extra-axial fluid collections. Gray-white matter junctions are distinct. Basal cisterns are not effaced. No evidence of acute intracranial hemorrhage. No depressed skull fractures. Visualized paranasal sinuses and mastoid air cells are not opacified. Vascular calcifications. IMPRESSION: No acute intracranial abnormalities. Chronic atrophy and small vessel ischemic changes. Electronically Signed   By: Lucienne Capers M.D.   On: 03-06-2015 05:09   Dg Chest Port 1 View  02/25/2015  CLINICAL DATA:  Dyspnea.  Aspiration and respiratory failure. EXAM: PORTABLE CHEST 1 VIEW COMPARISON:  02/23/2015 FINDINGS: Dual lead right-sided pacemaker remains in place. Right apical opacity with volume loss, unchanged from prior exam. There is developing patchy opacity at the right lung base. Increasing interstitial  opacities concerning for developing pulmonary edema. Calcified granuloma are again seen. Blunting of both costophrenic angles, may reflect small effusions. Cardiomediastinal contours are unchanged. IMPRESSION: 1. Development of diffuse interstitial opacities, concerning for pulmonary edema. Question small pleural effusions. 2. Ill-defined patchy opacity at the right lung base, may be vascular, atelectasis, or aspiration. Electronically Signed   By:  Jeb Levering M.D.   On: 02/25/2015 00:28   Dg Chest Port 1 View  02/23/2015  CLINICAL DATA:  Patient complains of choking on chicken. Large amount of saliva. No actual vomiting. History of atrial fibrillation, type 2 diabetes, small cell carcinoma of lung, sick sinus syndrome. Former smoker quit in 1984. EXAM: PORTABLE CHEST 1 VIEW COMPARISON:  07/12/2011 FINDINGS: Cardiac pacemaker. Shallow inspiration. Volume loss in the right lung with opacity in the right apex probably postoperative given the history of lung cancer. No change in appearance since the previous study. Large calcified granuloma in the left upper lung with calcified lymph node in the left aortopulmonic window region. No focal airspace disease or consolidation in the lungs. Mediastinal contours are unchanged since previous study. IMPRESSION: No evidence of active pulmonary disease. No change since prior study. Right apical opacity with volume loss in the right lung likely postoperative. Large calcified granulomas. Electronically Signed   By: Lucienne Capers M.D.   On: 02/23/2015 21:53     Labs:   Basic Metabolic Panel:  Recent Labs Lab 02/23/15 2144 02/24/15 0138 02/24/15 0714 02/24/15 2317 02/25/15 0031 02/28/15 0545  NA 139  --  140 139  --  140  K 4.1  --  4.0 4.4  --  4.2  CL 105  --  110 111  --  103  CO2 25  --  21* 15*  --  24  GLUCOSE 129*  --  98 212*  --  153*  BUN 19  --  16 15  --  28*  CREATININE 1.24*  --  0.94 1.04*  --  1.03*  CALCIUM 9.5  --  7.9* 8.1*  --  9.0  MG  --  1.3* 1.7  --  1.4*  --    GFR Estimated Creatinine Clearance: 38.5 mL/min (by C-G formula based on Cr of 1.03). Liver Function Tests:  Recent Labs Lab 02/24/15 0714  AST 29  ALT 9*  ALKPHOS 72  BILITOT 0.9  PROT 6.4*  ALBUMIN 3.1*   CBC:  Recent Labs Lab 02/23/15 2144 02/24/15 0714 02/24/15 2317 02/28/15 0545  WBC 10.7* 8.7 17.8* 9.5  NEUTROABS 7.7 7.0  --   --   HGB 13.9 11.4* 12.7 11.1*  HCT 41.9 36.1 39.6  34.4*  MCV 88.0 91.9 90.6 89.4  PLT 215 134* 221 186   Cardiac Enzymes:  Recent Labs Lab 02/24/15 0138 02/24/15 0714 02/24/15 1232 02/24/15 1811 02/25/15 0031  TROPONINI 0.16* 0.48* 0.87* 0.66* 0.54*   Microbiology Recent Results (from the past 240 hour(s))  MRSA PCR Screening     Status: Abnormal   Collection Time: 02/24/15  2:39 AM  Result Value Ref Range Status   MRSA by PCR POSITIVE (A) NEGATIVE Final    Comment:        The GeneXpert MRSA Assay (FDA approved for NASAL specimens only), is one component of a comprehensive MRSA colonization surveillance program. It is not intended to diagnose MRSA infection nor to guide or monitor treatment for MRSA infections. RESULT CALLED TO, READ BACK BY AND VERIFIED WITH: DENNY,C/2W '@0546'  ON 02/24/15 BY KARCZEWSKI,S.  Discharge Instructions:   Discharge Instructions    Call MD for:  severe uncontrolled pain    Complete by:  As directed      Diet general    Complete by:  As directed      Increase activity slowly    Complete by:  As directed             Medication List    STOP taking these medications        aspirin EC 81 MG tablet     atorvastatin 10 MG tablet  Commonly known as:  LIPITOR     carvedilol 6.25 MG tablet  Commonly known as:  COREG     citalopram 20 MG tablet  Commonly known as:  CELEXA     CITRACAL + D PO     donepezil 5 MG tablet  Commonly known as:  ARICEPT     Estradiol 10 MCG Tabs vaginal tablet     menthol-cetylpyridinium 3 MG lozenge  Commonly known as:  CEPACOL     niacin 500 MG CR tablet  Commonly known as:  NIASPAN     oxybutynin 5 MG tablet  Commonly known as:  DITROPAN     oxyCODONE-acetaminophen 5-325 MG tablet  Commonly known as:  PERCOCET/ROXICET     sennosides-docusate sodium 8.6-50 MG tablet  Commonly known as:  SENOKOT-S     traZODone 50 MG tablet  Commonly known as:  DESYREL     vitamin B-12 1000 MCG tablet  Commonly known as:  CYANOCOBALAMIN       TAKE these medications        acetaminophen 325 MG tablet  Commonly known as:  TYLENOL  Take 2 tablets (650 mg total) by mouth every 6 (six) hours as needed for mild pain, moderate pain, fever or headache.     Albuterol Sulfate 108 (90 BASE) MCG/ACT Aepb  Inhale 1 puff into the lungs 4 (four) times daily.     ALPRAZolam 0.25 MG tablet  Commonly known as:  XANAX  Take 1 tablet (0.25 mg total) by mouth 3 (three) times daily as needed for anxiety.     fentaNYL 25 MCG/HR patch  Commonly known as:  DURAGESIC - dosed mcg/hr  Place 25 mcg onto the skin every 3 (three) days.     morphine CONCENTRATE 10 MG/0.5ML Soln concentrated solution  Take 0.25 mLs (5 mg total) by mouth every 2 (two) hours as needed for moderate pain or shortness of breath (dyspnea, RR > 25).           Follow-up Information    Call Reymundo Poll, MD.   Specialty:  Family Medicine   Why:  As needed   Contact information:   [address] [place] [address] [phone]        Time coordinating discharge: 35 minutes.  Signed:  RAMA,CHRISTINA  Pager [phone] [facility] 03/01/2015, 1:17 PM

## 2015-03-01 NOTE — Progress Notes (Signed)
Progress Note   MIKALA PODOLL EVO:[id number] DOB: 02-23-1932 DOA: 02/23/2015 PCP: Reymundo Poll, MD   Brief Narrative:   [name] is an 81 y.o. female with a PMH of PAF, bradycardia and syncope status post pacemaker (not on anticoagulation secondary to high fall risk), moderately obstructive CAD with normal LV function, diet controlled type 2 diabetes, hypertension, limited stage small cell lung cancer status post radiation and chemotherapy, frequent falls and stage III chronic kidney disease who was admitted 02/23/15 secondary to choking on her food. EGD done at bedside revealed food impaction which was disimpacted. She was also found to be in atrial fibrillation with RVR necessitating treatment with a Cardizem drip. Chest x-ray done on admission showed diffuse interstitial opacities with possible aspiration pneumonia. She subsequently developed respiratory failure and was transferred to the SDU requiring BiPAP. A palliative care consultation was subsequently requested given her overall poor prognosis and multiple comorbidities.  Assessment/Plan:   Principal Problems: Esophageal foreign body / Peptic stricture of esophagus - S/P EGD with findings of food impaction in esophagus at UES s/p disimpaction 02/24/15. - SLP evaluation most recently 02/25/15 - nectar thick liquids recommended. - Continue Aspiration precautions.  - The patient is now receiving comfort care with plans to discharge to a residential hospice facility.  Acute respiratory failure with hypoxia (HCC) / Aspiration pneumonia (HCC) / Leukocytosis - Status post CXR 02/25/15 concerning for interstitial edema and right lower lobe pneumonia concerning for aspiration. - Will give Lasix 40 mg IV x 1 now secondary to respiratory distress, increased WOB. - Treated with 02/25/15-02/28/15, now on full comfort measures. - Initially required BiPAP, now on nasal cannula oxygen with no plans to resume BiPAP. - Continue xopenex  and atrovent every 4 hours as needed for shortness of breath or wheezing.   Paroxysmal Atrial fibrillation with RVR (HCC) / Pacemaker-MDT dual  - Rate initially controlled with Cardizem drip. All rate controlling meds D/C'd 02/28/15 in move toward comfort care. - Not on AC due to dementia, risk of falls.  Aspirin D/C'd 02/28/15.  - Palliative care team met with family 02/26/15, now receiving full comfort care.   Acute on chronic diastolic CHF - Last 2 D ECHO in 2008 with preserved EF. Has pacemaker in place. - BNP on this admission 2112. - Give Lasix as needed for dyspnea/pulmonary edema.  Coronary atherosclerosis of native coronary artery / Elevated troponin / demand ischemia - Likely demand ischemia from acute decompensated diastolic CHF.  - Trop as high as 0.87 but leveled down to 0.54. - She is not a candidate for aggressive cardiac work up due to dementia and multiple other comorbidities. - Palliative care met with patient's family 02/26/15 to discuss goals of care. We'll discharge to residential hospice.  Elevated D dimer - No work up initiated, now on full comfort measures.   Active Problems: Depression / Dementia  - Donepezil and celexa discontinued.   Essential hypertension, benign - Coreg discontinued.  Dyslipidemia - Statin therapy discontinued.   Anemia of chronic disease - Secondary to CKD. Hgb stable. No further lab draws.  CKD (chronic kidney disease) stage 3, GFR 30-59 ml/min - Baseline Cr about 1.3.  No further lab checks.   Hypomagnesemia - Supplemented.   Small cell carcinoma of lung (HCC) - Remote history, limited stage. - Has followed with Dr. Marin Olp.  MRSA carrier - Decontamination therapy completed.  DVT Prophylaxis  - Lovenox discontinued in light of comfort measures.   Code Status: DNR/DNI Family  Communication: Daughter updated at the bedside. Disposition Plan: Residential hospice when bed available. Medically stable for  transfer.   IV Access:    Peripheral IV   Procedures and diagnostic studies:   No results found.   Medical Consultants:    Gastroenterology: Mauri Pole, MD  Palliative care: Pershing Proud, NP  Anti-Infectives:   Anti-infectives    Start     Dose/Rate Route Frequency Ordered Stop   02/25/15 0115  piperacillin-tazobactam (ZOSYN) IVPB 3.375 g  Status:  Discontinued     3.375 g 12.5 mL/hr over 240 Minutes Intravenous 3 times per day 02/25/15 0106 02/28/15 1208      Subjective:   [name] is resting comfortably and seems more alert than she was yesterday. Dyspnea controlled. No complaints of pain. No reports of nausea or vomiting.  Objective:    Filed Vitals:   02/28/15 1000 02/28/15 1357 02/28/15 2317 03/01/15 0546  BP:  88/63 117/64 90/70  Pulse:  90 99 128  Temp: 100.1 F (37.8 C)  97.4 F (36.3 C) 97.4 F (36.3 C)  TempSrc: Rectal  Axillary Axillary  Resp:  _0 Height:      Weight:      SpO2:  98% 100% 100%    Intake/Output Summary (Last 24 hours) at 03/01/15 0724 Last data filed at 02/28/15 0958  Gross per 24 hour  Intake      3 ml  Output      0 ml  Net      3 ml   Filed Weights   02/24/15 0800 02/27/15 0500 02/27/15 1122  Weight: 55.6 kg (122 lb 9.2 oz) 55.6 kg (122 lb 9.2 oz) 57.7 kg (127 lb 3.3 oz)    Exam: Gen:  NAD Cardiovascular:  Heart sounds tachy/reg, No M/R/G Respiratory:  Lungs diminished and a bit clearer Gastrointestinal:  Abdomen soft, NT/ND, + BS Extremities:  No C/E/C   Data Reviewed:    Labs: Basic Metabolic Panel:  Recent Labs Lab 02/23/15 2144 02/24/15 0138 02/24/15 0714 02/24/15 2317 02/25/15 0031 02/28/15 0545  NA 139  --  140 139  --  140  K 4.1  --  4.0 4.4  --  4.2  CL 105  --  110 111  --  103  CO2 25  --  21* 15*  --  24  GLUCOSE 129*  --  98 212*  --  153*  BUN 19  --  16 15  --  28*  CREATININE 1.24*  --  0.94 1.04*  --  1.03*  CALCIUM 9.5  --  7.9* 8.1*  --  9.0  MG  --   1.3* 1.7  --  1.4*  --    GFR Estimated Creatinine Clearance: 38.5 mL/min (by C-G formula based on Cr of 1.03). Liver Function Tests:  Recent Labs Lab 02/24/15 0714  AST 29  ALT 9*  ALKPHOS 72  BILITOT 0.9  PROT 6.4*  ALBUMIN 3.1*   CBC:  Recent Labs Lab 02/23/15 2144 02/24/15 0714 02/24/15 2317 02/28/15 0545  WBC 10.7* 8.7 17.8* 9.5  NEUTROABS 7.7 7.0  --   --   HGB 13.9 11.4* 12.7 11.1*  HCT 41.9 36.1 39.6 34.4*  MCV 88.0 91.9 90.6 89.4  PLT 215 134* 221 186   Cardiac Enzymes:  Recent Labs Lab 02/24/15 0138 02/24/15 0714 02/24/15 1232 02/24/15 1811 02/25/15 0031  TROPONINI 0.16* 0.48* 0.87* 0.66* 0.54*   D-Dimer: No results for input(s): DDIMER  in the last 72 hours. Microbiology Recent Results (from the past 240 hour(s))  MRSA PCR Screening     Status: Abnormal   Collection Time: 02/24/15  2:39 AM  Result Value Ref Range Status   MRSA by PCR POSITIVE (A) NEGATIVE Final    Comment:        The GeneXpert MRSA Assay (FDA approved for NASAL specimens only), is one component of a comprehensive MRSA colonization surveillance program. It is not intended to diagnose MRSA infection nor to guide or monitor treatment for MRSA infections. RESULT CALLED TO, READ BACK BY AND VERIFIED WITH: DENNY,C/2W _0  ON 02/24/15 BY KARCZEWSKI,S.      Medications:   . antiseptic oral rinse  7 mL Mouth Rinse BID  . Chlorhexidine Gluconate Cloth  6 each Topical Daily  . citalopram  20 mg Oral QHS  . mupirocin ointment  1 application Nasal BID  . sodium chloride  3 mL Intravenous Q12H   Continuous Infusions: . sodium chloride 10 mL/hr at 02/27/15 0510    Time spent: 25 minutes.    LOS: 5 days   [facility] Pager [phone]. If unable to reach me by pager, please call my cell phone at [phone].  *Please refer to [email or web address], password [email or web address] to get updated schedule on who will round on this patient, as hospitalists switch teams weekly. If  7PM-7AM, please contact night-coverage at [email or web address], password [email or web address] for any overnight needs.  03/01/2015, 7:24 AM

## 2015-03-12 DEATH — deceased

## 2016-04-24 ENCOUNTER — Other Ambulatory Visit: Payer: Self-pay | Admitting: Nurse Practitioner
